# Patient Record
Sex: Female | Born: 1965 | Race: Black or African American | Hispanic: No | Marital: Single | Smoking: Former smoker
Health system: Southern US, Community
[De-identification: ages and names within clinical notes are randomized; demographics above are authoritative.]

## PROBLEM LIST (undated history)

## (undated) DIAGNOSIS — E785 Hyperlipidemia, unspecified: Secondary | ICD-10-CM

## (undated) DIAGNOSIS — K5790 Diverticulosis of intestine, part unspecified, without perforation or abscess without bleeding: Secondary | ICD-10-CM

## (undated) DIAGNOSIS — K219 Gastro-esophageal reflux disease without esophagitis: Secondary | ICD-10-CM

## (undated) DIAGNOSIS — M1990 Unspecified osteoarthritis, unspecified site: Secondary | ICD-10-CM

## (undated) DIAGNOSIS — I428 Other cardiomyopathies: Secondary | ICD-10-CM

## (undated) DIAGNOSIS — J302 Other seasonal allergic rhinitis: Secondary | ICD-10-CM

## (undated) DIAGNOSIS — G4730 Sleep apnea, unspecified: Secondary | ICD-10-CM

## (undated) DIAGNOSIS — I509 Heart failure, unspecified: Secondary | ICD-10-CM

## (undated) DIAGNOSIS — Z800 Family history of malignant neoplasm of digestive organs: Secondary | ICD-10-CM

## (undated) DIAGNOSIS — G4733 Obstructive sleep apnea (adult) (pediatric): Secondary | ICD-10-CM

## (undated) DIAGNOSIS — J45909 Unspecified asthma, uncomplicated: Secondary | ICD-10-CM

## (undated) DIAGNOSIS — E669 Obesity, unspecified: Secondary | ICD-10-CM

## (undated) DIAGNOSIS — T7840XA Allergy, unspecified, initial encounter: Secondary | ICD-10-CM

## (undated) DIAGNOSIS — I10 Essential (primary) hypertension: Secondary | ICD-10-CM

## (undated) HISTORY — DX: Sleep apnea, unspecified: G47.30

## (undated) HISTORY — DX: Gastro-esophageal reflux disease without esophagitis: K21.9

## (undated) HISTORY — DX: Unspecified asthma, uncomplicated: J45.909

## (undated) HISTORY — DX: Obstructive sleep apnea (adult) (pediatric): G47.33

## (undated) HISTORY — DX: Obesity, unspecified: E66.9

## (undated) HISTORY — DX: Family history of malignant neoplasm of digestive organs: Z80.0

## (undated) HISTORY — DX: Other cardiomyopathies: I42.8

## (undated) HISTORY — DX: Unspecified osteoarthritis, unspecified site: M19.90

## (undated) HISTORY — DX: Essential (primary) hypertension: I10

## (undated) HISTORY — PX: COLONOSCOPY: SHX174

## (undated) HISTORY — DX: Diverticulosis of intestine, part unspecified, without perforation or abscess without bleeding: K57.90

## (undated) HISTORY — DX: Other seasonal allergic rhinitis: J30.2

## (undated) HISTORY — DX: Hyperlipidemia, unspecified: E78.5

## (undated) HISTORY — DX: Allergy, unspecified, initial encounter: T78.40XA

## (undated) HISTORY — PX: TUBAL LIGATION: SHX77

---

## 1998-01-15 ENCOUNTER — Emergency Department (HOSPITAL_COMMUNITY): Admission: EM | Admit: 1998-01-15 | Discharge: 1998-01-15 | Payer: Self-pay | Admitting: Emergency Medicine

## 1998-01-16 ENCOUNTER — Emergency Department (HOSPITAL_COMMUNITY): Admission: EM | Admit: 1998-01-16 | Discharge: 1998-01-16 | Payer: Self-pay | Admitting: Emergency Medicine

## 1998-01-26 ENCOUNTER — Emergency Department (HOSPITAL_COMMUNITY): Admission: EM | Admit: 1998-01-26 | Discharge: 1998-01-26 | Payer: Self-pay

## 1998-04-24 ENCOUNTER — Emergency Department (HOSPITAL_COMMUNITY): Admission: EM | Admit: 1998-04-24 | Discharge: 1998-04-24 | Payer: Self-pay | Admitting: Emergency Medicine

## 2005-07-09 ENCOUNTER — Ambulatory Visit: Payer: Self-pay | Admitting: Gastroenterology

## 2005-07-15 ENCOUNTER — Ambulatory Visit: Payer: Self-pay | Admitting: Gastroenterology

## 2008-01-02 ENCOUNTER — Emergency Department (HOSPITAL_BASED_OUTPATIENT_CLINIC_OR_DEPARTMENT_OTHER): Admission: EM | Admit: 2008-01-02 | Discharge: 2008-01-02 | Payer: Self-pay | Admitting: Emergency Medicine

## 2008-02-19 ENCOUNTER — Emergency Department (HOSPITAL_COMMUNITY): Admission: EM | Admit: 2008-02-19 | Discharge: 2008-02-19 | Payer: Self-pay | Admitting: Emergency Medicine

## 2008-03-01 LAB — CONVERTED CEMR LAB

## 2008-03-15 ENCOUNTER — Encounter: Admission: RE | Admit: 2008-03-15 | Discharge: 2008-03-15 | Payer: Self-pay | Admitting: Obstetrics & Gynecology

## 2008-04-01 ENCOUNTER — Emergency Department (HOSPITAL_COMMUNITY): Admission: EM | Admit: 2008-04-01 | Discharge: 2008-04-01 | Payer: Self-pay | Admitting: Family Medicine

## 2009-02-03 ENCOUNTER — Emergency Department (HOSPITAL_COMMUNITY): Admission: EM | Admit: 2009-02-03 | Discharge: 2009-02-03 | Payer: Self-pay | Admitting: Emergency Medicine

## 2009-02-15 ENCOUNTER — Ambulatory Visit: Payer: Self-pay | Admitting: Internal Medicine

## 2009-02-15 ENCOUNTER — Inpatient Hospital Stay (HOSPITAL_COMMUNITY): Admission: EM | Admit: 2009-02-15 | Discharge: 2009-02-20 | Payer: Self-pay | Admitting: Emergency Medicine

## 2009-02-15 DIAGNOSIS — I080 Rheumatic disorders of both mitral and aortic valves: Secondary | ICD-10-CM | POA: Insufficient documentation

## 2009-02-15 DIAGNOSIS — D1803 Hemangioma of intra-abdominal structures: Secondary | ICD-10-CM

## 2009-02-16 ENCOUNTER — Encounter (INDEPENDENT_AMBULATORY_CARE_PROVIDER_SITE_OTHER): Payer: Self-pay | Admitting: Internal Medicine

## 2009-02-22 DIAGNOSIS — I10 Essential (primary) hypertension: Secondary | ICD-10-CM

## 2009-02-22 DIAGNOSIS — K219 Gastro-esophageal reflux disease without esophagitis: Secondary | ICD-10-CM

## 2009-02-22 DIAGNOSIS — J301 Allergic rhinitis due to pollen: Secondary | ICD-10-CM | POA: Insufficient documentation

## 2009-02-22 DIAGNOSIS — I509 Heart failure, unspecified: Secondary | ICD-10-CM | POA: Insufficient documentation

## 2009-02-22 HISTORY — DX: Gastro-esophageal reflux disease without esophagitis: K21.9

## 2009-02-27 ENCOUNTER — Ambulatory Visit: Payer: Self-pay | Admitting: Internal Medicine

## 2009-03-15 ENCOUNTER — Telehealth: Payer: Self-pay | Admitting: Internal Medicine

## 2009-03-20 ENCOUNTER — Encounter: Payer: Self-pay | Admitting: Internal Medicine

## 2009-03-22 ENCOUNTER — Encounter (INDEPENDENT_AMBULATORY_CARE_PROVIDER_SITE_OTHER): Payer: Self-pay | Admitting: Nurse Practitioner

## 2009-03-22 ENCOUNTER — Ambulatory Visit: Payer: Self-pay | Admitting: Internal Medicine

## 2009-03-22 DIAGNOSIS — H1045 Other chronic allergic conjunctivitis: Secondary | ICD-10-CM

## 2009-03-22 DIAGNOSIS — J45909 Unspecified asthma, uncomplicated: Secondary | ICD-10-CM | POA: Insufficient documentation

## 2009-05-19 ENCOUNTER — Ambulatory Visit: Payer: Self-pay | Admitting: Internal Medicine

## 2009-06-06 ENCOUNTER — Ambulatory Visit (HOSPITAL_COMMUNITY): Admission: RE | Admit: 2009-06-06 | Discharge: 2009-06-06 | Payer: Self-pay | Admitting: Internal Medicine

## 2009-06-06 ENCOUNTER — Encounter: Payer: Self-pay | Admitting: Internal Medicine

## 2009-06-06 ENCOUNTER — Ambulatory Visit: Payer: Self-pay

## 2009-06-06 ENCOUNTER — Encounter: Admission: RE | Admit: 2009-06-06 | Discharge: 2009-06-06 | Payer: Self-pay | Admitting: Obstetrics & Gynecology

## 2009-06-06 ENCOUNTER — Ambulatory Visit: Payer: Self-pay | Admitting: Cardiovascular Disease

## 2009-07-03 LAB — CONVERTED CEMR LAB
CO2: 29 meq/L (ref 19–32)
Chloride: 102 meq/L (ref 96–112)
Creatinine, Ser: 0.7 mg/dL (ref 0.4–1.2)
Glucose, Bld: 104 mg/dL — ABNORMAL HIGH (ref 70–99)
Pro B Natriuretic peptide (BNP): 39 pg/mL (ref 0.0–100.0)
Sodium: 138 meq/L (ref 135–145)

## 2009-08-09 ENCOUNTER — Ambulatory Visit: Payer: Self-pay | Admitting: Internal Medicine

## 2009-08-09 DIAGNOSIS — I5022 Chronic systolic (congestive) heart failure: Secondary | ICD-10-CM | POA: Insufficient documentation

## 2009-09-07 ENCOUNTER — Ambulatory Visit: Payer: Self-pay | Admitting: Internal Medicine

## 2009-09-11 LAB — CONVERTED CEMR LAB
BUN: 10 mg/dL (ref 6–23)
CO2: 31 meq/L (ref 19–32)
Chloride: 104 meq/L (ref 96–112)
Creatinine, Ser: 0.6 mg/dL (ref 0.4–1.2)
Potassium: 3.8 meq/L (ref 3.5–5.1)

## 2009-10-11 ENCOUNTER — Encounter (INDEPENDENT_AMBULATORY_CARE_PROVIDER_SITE_OTHER): Payer: Self-pay | Admitting: *Deleted

## 2009-12-06 ENCOUNTER — Ambulatory Visit: Payer: Self-pay | Admitting: Internal Medicine

## 2009-12-14 ENCOUNTER — Ambulatory Visit: Payer: Self-pay | Admitting: Internal Medicine

## 2009-12-14 LAB — CONVERTED CEMR LAB
BUN: 13 mg/dL (ref 6–23)
Calcium: 9.2 mg/dL (ref 8.4–10.5)
Creatinine, Ser: 0.8 mg/dL (ref 0.4–1.2)
Glucose, Bld: 88 mg/dL (ref 70–99)

## 2010-02-28 ENCOUNTER — Telehealth: Payer: Self-pay | Admitting: Internal Medicine

## 2010-04-09 ENCOUNTER — Ambulatory Visit
Admission: RE | Admit: 2010-04-09 | Discharge: 2010-04-09 | Payer: Self-pay | Source: Home / Self Care | Attending: Internal Medicine | Admitting: Internal Medicine

## 2010-04-09 ENCOUNTER — Telehealth (INDEPENDENT_AMBULATORY_CARE_PROVIDER_SITE_OTHER): Payer: Self-pay | Admitting: *Deleted

## 2010-04-10 ENCOUNTER — Encounter: Payer: Self-pay | Admitting: Internal Medicine

## 2010-04-13 LAB — CONVERTED CEMR LAB
CO2: 25 meq/L (ref 19–32)
Creatinine, Ser: 0.75 mg/dL (ref 0.40–1.20)
Digitoxin Lvl: 0.4 ng/mL — ABNORMAL LOW (ref 0.8–2.0)
Glucose, Bld: 78 mg/dL (ref 70–99)

## 2010-04-29 LAB — CONVERTED CEMR LAB
Potassium: 4.2 meq/L (ref 3.5–5.1)
Sodium: 139 meq/L (ref 135–145)

## 2010-05-03 NOTE — Assessment & Plan Note (Signed)
Summary: per check out/sf   Visit Type:  Follow-up Primary Provider:  need primary care dr pt has medicade  CC:  no complaints.  History of Present Illness: patient is a 45 year old with a history of HTN, NICM.  I last saw her in November. Since then she has been doing well.  Denies significant shortness of breath.  No signif edema. No cP.  No palpitations.  Eager to start exercising.  Current Medications (verified): 1)  Potassium Chloride Cr 10 Meq Cr-Caps (Potassium Chloride) .... 4 Tabs Two Times A Day 2)  Enalapril Maleate 2.5 Mg Tabs (Enalapril Maleate) .... Take One Tablet By Mouth Once Daily 3)  Digoxin 0.125 Mg Tabs (Digoxin) .Marland Kitchen.. 1 Tab Once Daily 4)  Furosemide 40 Mg Tabs (Furosemide) .... Take One Tablet  Two Times A Day 5)  Carvedilol 3.125 Mg Tabs (Carvedilol) .... Take One Tablet By Mouth Twice A Day  Allergies (verified): 1)  ! Iodine 2)  ! * Peanut Products 3)  ! * Shellfish  Past History:  Past Medical History: Last updated: 02/27/2009  1. Hypertension.   2. Gastroesophageal reflux disease.   3. Seasonal allergies.    4. Nonischemic cardiomyopathy.  Social History: Last updated: 03/22/2009  No alcohol.  No drugs.  No cigarettes.  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 EOMI, PERRLA.  Neck: JVP is normal. No thyromegaly. No bruits.  Lungs: clear to auscultation. No rales no wheezes.  Heart: Regular rate and rhythm. Normal S1, S2. No S3.   No significant murmurs. PMI not displaced.  Abdomen:  Supple, nontender. Normal bowel sounds. No masses. No hepatomegaly.  Extremities:   Good distal pulses throughout. No lower extremity edema.  Musculoskeletal :moving all extremities.  Neuro:   alert and oriented x3.    Impression & Recommendations:  Problem # 1:  CHF (ICD-428.0) Continue meds.  Volume status looks good.  Check labs.  May add aldactone.  Will schedule echo.  May need EP referral dep on LVEF. OK to walk, do water aerobics.  Problem # 2:  HYPERTENSION (ICD-401.9) GOod.  Check labs. Her updated medication list for this problem includes:    Enalapril Maleate 2.5 Mg Tabs (Enalapril maleate) .Marland Kitchen... Take one tablet by mouth once daily    Furosemide 40 Mg Tabs (Furosemide) .Marland Kitchen... Take one tablet  two times a day    Carvedilol 3.125 Mg Tabs (Carvedilol) .Marland Kitchen... Take one tablet by mouth twice a day  Other Orders: TLB-BMP (Basic Metabolic Panel-BMET) (80048-METABOL) TLB-BNP (B-Natriuretic Peptide) (83880-BNPR) Primary Care Referral (Primary) Echocardiogram (Echo)  Patient Instructions: 1)  Your physician recommends that you return for lab work in: lab work today...we will call you with results 2)  Your physician recommends that you schedule a follow-up appointment in: 4 months 3)  Your physician has requested that you have an echocardiogram.  Echocardiography is a painless test that uses sound waves to create images of your heart. It provides your doctor with information about the size  and shape of your heart and how well your heart's chambers and valves are working.  This procedure takes approximately one hour. There are no restrictions for this procedure.  we will call you back after we get the results of the test.

## 2010-05-03 NOTE — Letter (Signed)
Summary: Letter  Letter   Imported By: Kassie Mends 05/19/2009 12:47:35  _____________________________________________________________________  External Attachment:    Type:   Image     Comment:   External Document

## 2010-05-03 NOTE — Progress Notes (Signed)
  Phone Note Other Incoming   Request: Send information Summary of Call: Request for records received from [facility]. Request forwarded to [facility].     

## 2010-05-03 NOTE — Letter (Signed)
Summary: Appointment - Missed  [facility], Main Office  [address]   [place], [place] [address]   Phone: [phone]  Fax: [phone]     October 11, 2009 MRN: [id number]   Katrina Carrillo [address] [place], [place]  [address]   Dear Ms. Cherie Dark,  Our records indicate you missed your appointment on 10/05/09  with Dr.Ross.   It is very important that we reach you to reschedule this appointment. We look forward to participating in your health care needs. Please contact us at the number listed above at your earliest convenience to reschedule this appointment.     Sincerely,  Artist

## 2010-05-03 NOTE — Assessment & Plan Note (Signed)
Summary: per check out/sf   Primary Provider:  need primary care dr pt has medicade  CC:  rov.  Pt states she has questions about her pulse rate going up and down.  She feels like it may have regulated but still wants to know what numbers should concern her.  Pt also needs new Rx for digoxin.  History of Present Illness: Katrina Carrillo returns today for followup.  She is a pleasant 45 yo woman with a DCM and class 1-2 CHF.  She has LV dysfunction and we had considered prophylactic ICD but she has not had her medications maximized yet.  She denies syncope and is exercising regularly.  No peripheral edema or chest pain.  Current Medications (verified): 1)  Potassium Chloride Crys Cr 20 Meq Cr-Tabs (Potassium Chloride Crys Cr) .... Take 2 Tablets Two Times A Day 2)  Enalapril Maleate 2.5 Mg Tabs (Enalapril Maleate) .... Take One Tablet By Twice Daily 3)  Digoxin 0.125 Mg Tabs (Digoxin) .Marland Kitchen.. 1 Tab Once Daily 4)  Furosemide 40 Mg Tabs (Furosemide) .... Take One Tablet  Two Times A Day 5)  Vitamin D 400 Unit Tabs (Cholecalciferol) .... Qd 6)  Calcium 600 Mg Tabs (Calcium) .... Once Daily 7)  Multivitamins   Tabs (Multiple Vitamin) .... Qd 8)  Carvedilol 6.25 Mg Tabs (Carvedilol) .Marland Kitchen.. 1 Tab 2 Times Per Day  Allergies (verified): 1)  ! Iodine 2)  ! * Peanut Products 3)  ! * Shellfish  Past History:  Past Medical History: Last updated: 02/27/2009  1. Hypertension.   2. Gastroesophageal reflux disease.   3. Seasonal allergies.    4. Nonischemic cardiomyopathy.  Past Surgical History: Last updated: 03/22/2009  s/p tubal ligation   Review of Systems  The patient denies chest pain, syncope, dyspnea on exertion, and peripheral edema.    Vital Signs:  Patient profile:   45 year old female Menstrual status:  irregular Height:      63 inches Weight:      189 pounds BMI:     33.60 Pulse rate:   85 / minute Pulse rhythm:   regular BP sitting:   130 / 84  (right arm) Cuff size:    regular  Vitals Entered By: Judithe Modest CMA (December 06, 2009 9:31 AM)  Physical Exam  General:  Well developed, well nourished, in no acute distress.  HEENT: normal Neck: supple. No JVD. Carotids 2+ bilaterally no bruits Cor: RRR with a soft S4 gallop.  No murmurs or rubs.  PMI is enlarged and laterally displaced. Lungs: CTA Ab: soft, nontender. nondistended. No HSM. Good bowel sounds Ext: warm. no cyanosis, clubbing or edema Neuro: alert and oriented. Grossly nonfocal. affect pleasant    Impression & Recommendations:  Problem # 1:  CHRONIC SYSTOLIC HEART FAILURE (ICD-428.22)  Her symptoms are well controlled.  I have recommended we increase her enalapril to 5 mg twice daily.  She will come back in a week to recheck her potassium and creatinine. Her updated medication list for this problem includes:    Enalapril Maleate 5 Mg Tabs (Enalapril maleate) .Marland Kitchen... 1 two times a day    Digoxin 0.125 Mg Tabs (Digoxin) .Marland Kitchen... 1 tab once daily    Furosemide 40 Mg Tabs (Furosemide) .Marland Kitchen... Take one tablet  two times a day    Carvedilol 6.25 Mg Tabs (Carvedilol) .Marland Kitchen... 1 tab 2 times per day  Her updated medication list for this problem includes:    Enalapril Maleate 5 Mg Tabs (Enalapril maleate) .Marland KitchenMarland KitchenMarland KitchenMarland Kitchen  1 two times a day    Digoxin 0.125 Mg Tabs (Digoxin) .Marland Kitchen... 1 tab once daily    Furosemide 40 Mg Tabs (Furosemide) .Marland Kitchen... Take one tablet  two times a day    Carvedilol 6.25 Mg Tabs (Carvedilol) .Marland Kitchen... 1 tab 2 times per day  Problem # 2:  HYPERTENSION (ICD-401.9)  Her blood pressure is a bit on the high side.  Continue meds as below.  A low sodium diet is recommended. Her updated medication list for this problem includes:    Enalapril Maleate 5 Mg Tabs (Enalapril maleate) .Marland Kitchen... 1 two times a day    Furosemide 40 Mg Tabs (Furosemide) .Marland Kitchen... Take one tablet  two times a day    Carvedilol 6.25 Mg Tabs (Carvedilol) .Marland Kitchen... 1 tab 2 times per day  Her updated medication list for this problem  includes:    Enalapril Maleate 5 Mg Tabs (Enalapril maleate) .Marland Kitchen... 1 two times a day    Furosemide 40 Mg Tabs (Furosemide) .Marland Kitchen... Take one tablet  two times a day    Carvedilol 6.25 Mg Tabs (Carvedilol) .Marland Kitchen... 1 tab 2 times per day  Other Orders: EKG w/ Interpretation (93000) EKG w/ Interpretation (93000)  Patient Instructions: 1)  Your physician recommends that you schedule a follow-up appointment in: 4 MONTHS WITH DR ROSS 2)  Your physician recommends that you return for lab work in: 1 WEEK BMET 3)  Your physician has recommended you make the following change in your medication: INCREASE ENALAPRIL TO 5 MG two times a day  Prescriptions: ENALAPRIL MALEATE 5 MG TABS (ENALAPRIL MALEATE) 1 two times a day  #60 x 11   Entered by:   Scherrie Bateman, LPN   Authorized by:   Laren Boom, MD, [facility]   Signed by:   Scherrie Bateman, LPN on 16/12/9602   Method used:   Electronically to        [facility] (retail)       [address]       [place], [place]  [address]       Ph: [phone]       Fax: [phone]   RxID:   [id number] DIGOXIN 0.125 MG TABS (DIGOXIN) 1 tab once daily  #30 x 11   Entered by:   Scherrie Bateman, LPN   Authorized by:   Laren Boom, MD, [facility]   Signed by:   Scherrie Bateman, LPN on 01/26/2535   Method used:   Electronically to        [facility] (retail)       [address]       [place], [place]  [address]       Ph: [phone]       Fax: [phone]   RxID:   [id number]

## 2010-05-03 NOTE — Assessment & Plan Note (Signed)
Summary: per check out/sf   Visit Type:  Follow-up Primary Provider:  need primary care dr pt has medicade  CC:  fatigue.  History of Present Illness: Patient is a 45 year old with a nonischemic cardiomyopathy.  I last saw her in clinic earlier this spring.  Since then she has been seen by G. Ladona Ridgel.  He had recommended that she  have her medicines maximized as she tolerates.  Repeat echo after to reeval LVEF. Since seen she notes no severe SOB.  No chest pain.  No PND>  Current Medications (verified): 1)  Potassium Chloride Cr 10 Meq Cr-Caps (Potassium Chloride) .... Pt Out 2)  Enalapril Maleate 2.5 Mg Tabs (Enalapril Maleate) .... Take One Tablet By Twice Daily 3)  Digoxin 0.125 Mg Tabs (Digoxin) .Marland Kitchen.. 1 Tab Once Daily 4)  Furosemide 40 Mg Tabs (Furosemide) .... Take One Tablet  Two Times A Day 5)  Carvedilol 3.125 Mg Tabs (Carvedilol) .... Take One Tablet By Mouth Twice A Day 6)  Vitamin D 400 Unit Tabs (Cholecalciferol) .... Qd 7)  Calcium 600 Mg Tabs (Calcium) .... Once Daily 8)  Multivitamins   Tabs (Multiple Vitamin) .... Qd  Allergies (verified): 1)  ! Iodine 2)  ! * Peanut Products 3)  ! * Shellfish  Past History:  Past medical, surgical, family and social histories (including risk factors) reviewed, and no changes noted (except as noted below).  Past Medical History: Reviewed history from 02/27/2009 and no changes required.  1. Hypertension.   2. Gastroesophageal reflux disease.   3. Seasonal allergies.    4. Nonischemic cardiomyopathy.  Past Surgical History: Reviewed history from 03/22/2009 and no changes required.  s/p tubal ligation   Family History: Reviewed history from 02/22/2009 and no changes required.  Positive for hypertension, diabetes, and coronary   artery disease.   Social History: Reviewed history from 03/22/2009 and no changes required.  No alcohol.  No drugs.  No cigarettes.  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 EOMI, PERRLA.  Neck: JVP is normal. No thyromegaly. No bruits.  Lungs: clear to auscultation. No rales no wheezes.  Heart: Regular rate and rhythm. Normal S1, S2. No S3.  Gr II/VI systolic murmur. PMI not displaced.  Abdomen:  Supple, nontender. Normal bowel sounds. No masses. No hepatomegaly.  Extremities:   Good distal pulses throughout. No lower extremity edema.  Musculoskeletal :moving all extremities.  Neuro:   alert and oriented x3.    Impression & Recommendations:  Problem # 1:  CHRONIC SYSTOLIC HEART FAILURE (ICD-428.22) Patient's volume status looks good.  I would incrase her Coreg to 6.25 two times a day.  F/U in clinic in about 3 to 4 wks.  Plan echo probably inn late fall.  Problem # 2:  MITRAL REGURGITATION, MODERATE (ICD-396.3) Follow.  Problem # 3:  HYPERTENSION (ICD-401.9) Follow witn increase in meds.  Other Orders: TLB-BMP (Basic Metabolic Panel-BMET) (80048-METABOL)  Patient Instructions: 1)  Your physician recommends  that you schedule a follow-up appointment in: 1 month 2)  Your physician recommends that you return for lab work in: lab work today ...  we will call you with results Prescriptions: CARVEDILOL 6.25 MG TABS (CARVEDILOL) 1 tab 2 times per day  #60 x 11   Entered by:   Layne Benton, RN, BSN   Authorized by:   Sherrill Raring, MD, [facility]   Signed by:   Layne Benton, RN, BSN on 09/07/2009   Method used:   Electronically to        [facility] (retail)       [address]       [place], [place]  [address]       Ph:  [phone]       Fax: [phone]   RxID:   [id number] POTASSIUM CHLORIDE CR 10 MEQ CR-CAPS (POTASSIUM CHLORIDE) 1 cap every day  #30 x 11   Entered by:   Layne Benton, RN, BSN   Authorized by:   Sherrill Raring, MD, [facility]   Signed by:   Layne Benton, RN, BSN on 09/07/2009   Method used:   Electronically to        [facility] (retail)       [address]       [place], [place]  [address]       Ph: [phone]       Fax: [phone]   RxID:   [id number]

## 2010-05-03 NOTE — Miscellaneous (Signed)
Summary: Orders Update  Clinical Lists Changes  Orders: Added new Test order of TLB-BMP (Basic Metabolic Panel-BMET) (80048-METABOL) - Signed 

## 2010-05-03 NOTE — Assessment & Plan Note (Signed)
Summary: NEP/ EF 30 % / PT HAS HEALTH SERVE CARD./ GD   Visit Type:  Follow-up Primary Provider:  need primary care dr pt has medicade   History of Present Illness: The patient is referred today by Dr. Tenny Craw for consideration for ICD implant.  She is a pleasant 45 yo woman with a h/o systolic CHF diagnosed in November 2010.  At that time, she underwent left heart cath which demonstrated global dysfunction and no obstructive CAD.  She has hypertension and some medical non-compliance in the past.  The patient has been on lasix, digoxin, and low dose beta blocker and ACE inhibitor.  Her repeat 2D echo revealed that her EF has increased to 30%.  Her CHF is between class 1 and 2.  Current Medications (verified): 1)  Potassium Chloride Cr 10 Meq Cr-Caps (Potassium Chloride) .... 4 Tabs Two Times A Day 2)  Enalapril Maleate 2.5 Mg Tabs (Enalapril Maleate) .... Take One Tablet By Mouth Once Daily 3)  Digoxin 0.125 Mg Tabs (Digoxin) .Marland Kitchen.. 1 Tab Once Daily 4)  Furosemide 40 Mg Tabs (Furosemide) .... Take One Tablet  Two Times A Day 5)  Carvedilol 3.125 Mg Tabs (Carvedilol) .... Take One Tablet By Mouth Twice A Day 6)  Vitamin D 400 Unit Tabs (Cholecalciferol) .... Qd 7)  Calcium 600 Mg Tabs (Calcium) .... Once Daily 8)  Multivitamins   Tabs (Multiple Vitamin) .... Qd  Allergies (verified): 1)  ! Iodine 2)  ! * Peanut Products 3)  ! * Shellfish  Past History:  Past Medical History: Last updated: 02/27/2009  1. Hypertension.   2. Gastroesophageal reflux disease.   3. Seasonal allergies.    4. Nonischemic cardiomyopathy.  Review of Systems       All systems reviewed and negative except as noted in the HPI.  Vital Signs:  Patient profile:   45 year old female Menstrual status:  irregular Height:      63 inches Weight:      176 pounds BMI:     31.29 Pulse rate:   86 / minute BP sitting:   130 / 90  (left arm)  Vitals Entered By: Laurance Flatten CMA (Aug 09, 2009 11:39 AM)  Physical  Exam  General:  Well developed, well nourished, in no acute distress.  HEENT: normal Neck: supple. No JVD. Carotids 2+ bilaterally no bruits Cor: RRR with a soft S4 gallop.  No murmurs or rubs.  PMI is enlarged and laterally displaced. Lungs: CTA Ab: soft, nontender. nondistended. No HSM. Good bowel sounds Ext: warm. no cyanosis, clubbing or edema Neuro: alert and oriented. Grossly nonfocal. affect pleasant    EKG  Procedure date:  08/09/2009  Findings:      Normal sinus rhythm with rate of: 86.   Impression & Recommendations:  Problem # 1:  CHRONIC SYSTOLIC HEART FAILURE (ICD-428.22) Her EF by echo is improve on very lose dose medical therapy.  She has class 1-2 CHF.  I have discussed the treatment options with the patient and her daughter.  I have recommended we try to continue to uptitrate her CHF meds.  Once we get her on maximal medical therapy, I would recheck her again, perhaps with MRI and would recommend ICD if her EF remains less than 35% and CHF is class 2. Her updated medication list for this problem includes:    Enalapril Maleate 2.5 Mg Tabs (Enalapril maleate) .Marland Kitchen... Take one tablet by twice daily    Digoxin 0.125 Mg Tabs (Digoxin) .Marland KitchenMarland KitchenMarland KitchenMarland Kitchen 1  tab once daily    Furosemide 40 Mg Tabs (Furosemide) .Marland Kitchen... Take one tablet  two times a day    Carvedilol 3.125 Mg Tabs (Carvedilol) .Marland Kitchen... Take one tablet by mouth twice a day  Problem # 2:  HYPERTENSION (ICD-401.9) Her blood pressure is still elevated.  Will uptitrate her medical therapy and maintain a low sodium diet. Her updated medication list for this problem includes:    Enalapril Maleate 2.5 Mg Tabs (Enalapril maleate) .Marland Kitchen... Take one tablet by twice daily    Furosemide 40 Mg Tabs (Furosemide) .Marland Kitchen... Take one tablet  two times a day    Carvedilol 3.125 Mg Tabs (Carvedilol) .Marland Kitchen... Take one tablet by mouth twice a day  Patient Instructions: 1)  Your physician recommends that you schedule a follow-up appointment in: one month  with Dr Tenny Craw, 4 months with Dr Ladona Ridgel 2)  Your physician has recommended you make the following change in your medication: increase your Enalapril to 2.5mg  two times a day Prescriptions: ENALAPRIL MALEATE 2.5 MG TABS (ENALAPRIL MALEATE) Take one tablet by twice daily  #60 x 6   Entered by:   Dennis Bast, RN, BSN   Authorized by:   Laren Boom, MD, [facility]   Signed by:   Dennis Bast, RN, BSN on 08/09/2009   Method used:   Electronically to        [facility] (retail)       [address]       [place], [place]  [address]       Ph: [phone]       Fax: [phone]   RxID:   [id number]

## 2010-05-03 NOTE — Assessment & Plan Note (Signed)
Summary: 4 MO F/U ./CY   Visit Type:  Follow-up Primary Provider:  need primary care dr pt has medicade   History of Present Illness: Katrina Carrillo is a 45 year old with a history of NICM.  She was last in cardiology in the Fall of 2011.  She was seen by Rosette Reveal at that time.  He recommended maximizing medical RX and reevaluating  LVEF with echo.  He incrased enalopril. Since seen she has done rel well from a cardiac standpoint.  Still class I-II heart failure.  Under incrased stress with son.  Current Medications (verified): 1)  Potassium Chloride Crys Cr 20 Meq Cr-Tabs (Potassium Chloride Crys Cr) .... Take 2 Tablets Two Times A Day 2)  Enalapril Maleate 5 Mg Tabs (Enalapril Maleate) .Marland Kitchen.. 1 Two Times A Day 3)  Digoxin 0.125 Mg Tabs (Digoxin) .Marland Kitchen.. 1 Tab Once Daily 4)  Furosemide 40 Mg Tabs (Furosemide) .... Take One Tablet  Two Times A Day 5)  Calcium Carbonate-Vitamin D 600-400 Mg-Unit  Tabs (Calcium Carbonate-Vitamin D) .... Once Daily 6)  Carvedilol 6.25 Mg Tabs (Carvedilol) .Marland Kitchen.. 1 Tab 2 Times Per Day 7)  Claritin 10 Mg Tabs (Loratadine) .... As Needed  Allergies (verified): 1)  ! Iodine 2)  ! * Peanut Products 3)  ! * Shellfish  Past History:  Past medical, surgical, family and social histories (including risk factors) reviewed, and no changes noted (except as noted below).  Past Medical History: Reviewed history from 02/27/2009 and no changes required.  1. Hypertension.   2. Gastroesophageal reflux disease.   3. Seasonal allergies.    4. Nonischemic cardiomyopathy.  Past Surgical History: Reviewed history from 03/22/2009 and no changes required.  s/p tubal ligation   Family History: Reviewed history from 02/22/2009 and no changes required.  Positive for hypertension, diabetes, and coronary   artery disease.   Social History: Reviewed history from 03/22/2009 and no changes required.  No alcohol.  No drugs.  No cigarettes.  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  Neg to the above problem except as noted above.  Vital Signs:  Katrina Carrillo profile:   45 year old female Menstrual status:  irregular Height:      63 inches Weight:      188 pounds BMI:     33.42 Pulse rate:   85 / minute BP sitting:   122 / 74  (left arm) Cuff size:   regular  Vitals Entered By: Hardin Negus, RMA (April 09, 2010 4:21 PM)  Physical Exam  Additional Exam:  Katrina Carrillo is in NAD HEENT:  Normocephalic, atraumatic. EOMI, PERRLA.  Neck: JVP is normal. No thyromegaly. No bruits.  Lungs: clear to auscultation. No rales no wheezes.  Heart: Regular rate and rhythm. Normal S1, S2. No S3.   No significant murmurs. PMI not displaced.  Abdomen:  Supple, nontender. Normal bowel sounds. No masses. No hepatomegaly.  Extremities:   Good distal pulses throughout. No lower extremity edema.  Musculoskeletal :moving all extremities.  Neuro:   alert and oriented x3.    Impression & Recommendations:  Problem # 1:  CHRONIC SYSTOLIC HEART FAILURE (ICD-428.22) Volume looks god.  I would recommend increasing Coreg to 12. 5 mg two times a day.   CHeck BMET and DIg level today.  CHeck echo on return    F/U in 4 to 6 wks. The following medications were removed from  the medication list:    Carvedilol 6.25 Mg Tabs (Carvedilol) .Marland Kitchen... 1 tab 2 times per day Her updated medication list for this problem includes:    Enalapril Maleate 5 Mg Tabs (Enalapril maleate) .Marland Kitchen... 1 two times a day    Digoxin 0.125 Mg Tabs (Digoxin) .Marland Kitchen... 1 tab once daily    Furosemide 40 Mg Tabs (Furosemide) .Marland Kitchen... Take one tablet  two times a day    Carvedilol 12.5 Mg Tabs (Carvedilol) .Marland Kitchen... 1 tab every 12 hours  Orders: T-Basic Metabolic Panel [id number]) T-Digoxin [id number])  Problem # 2:  MITRAL REGURGITATION, MODERATE (ICD-396.3) Follow.  Problem # 3:  HYPERTENSION (ICD-401.9) Follow up in 4 wks. The following medications were removed  from the medication list:    Carvedilol 6.25 Mg Tabs (Carvedilol) .Marland Kitchen... 1 tab 2 times per day Her updated medication list for this problem includes:    Enalapril Maleate 5 Mg Tabs (Enalapril maleate) .Marland Kitchen... 1 two times a day    Furosemide 40 Mg Tabs (Furosemide) .Marland Kitchen... Take one tablet  two times a day    Carvedilol 12.5 Mg Tabs (Carvedilol) .Marland Kitchen... 1 tab every 12 hours  Katrina Carrillo Instructions: 1)  Your physician recommends that you return for lab work in: lab work today....we will call you with results 2)  Your physician recommends that you schedule a follow-up appointment in: 4 to 6 weeks with Dr.Ross Prescriptions: CARVEDILOL 12.5 MG TABS (CARVEDILOL) 1 tab every 12 hours  #60 x 6   Entered by:   Layne Benton, RN, BSN   Authorized by:   Sherrill Raring, MD, [facility]   Signed by:   Layne Benton, RN, BSN on 04/09/2010   Method used:   Electronically to        [facility] (retail)       [address]       [place], [place]  [address]       Ph: [phone]       Fax: [phone]   RxID:   [id number]

## 2010-05-03 NOTE — Progress Notes (Signed)
Summary: rx refill  Phone Note Refill Request Message from:  Patient on February 28, 2010 11:12 AM  Refills Requested: Medication #1:  POTASSIUM CHLORIDE CRYS CR 20 MEQ CR-TABS take 2 tablets two times a day  Medication #2:  FUROSEMIDE 40 MG TABS Take one tablet  two times a day furosemide please call it in at [facility]# [phone]. potassium please call in [facility]# [phone]   Method Requested: Telephone to Pharmacy Initial call taken by: Roe Coombs,  February 28, 2010 11:14 AM  Follow-up for Phone Call       Follow-up by: Judithe Modest CMA,  February 28, 2010 12:52 PM    Prescriptions: FUROSEMIDE 40 MG TABS (FUROSEMIDE) Take one tablet  two times a day  #60 Each x 2   Entered by:   Judithe Modest CMA   Authorized by:   Sherrill Raring, MD, [facility]   Signed by:   Judithe Modest CMA on 02/28/2010   Method used:   Electronically to        [facility] (retail)       [address]       [place], [place]  [address]       Ph: [phone]       Fax: [phone]   RxID:   [id number] POTASSIUM CHLORIDE CRYS CR 20 MEQ CR-TABS (POTASSIUM CHLORIDE CRYS CR) take 2 tablets two times a day  #120 x 1   Entered by:   Judithe Modest CMA   Authorized by:   Sherrill Raring, MD, [facility]   Signed by:   Judithe Modest CMA on 02/28/2010   Method used:   Electronically to        [facility] (retail)       [address]       [place], [place]  [address]       Ph: [phone]       Fax: [phone]   RxID:   [id number]  Correction made.  Potassium called in at [facility] on [address].  Spoke to [place] in the pharmacy.  Called and cancelled refill at [facility] for the potassium.  Per pt her furosemide is cheaper at [facility] and the potassium is cheaper at [facility].  Confirmed Rx with pt. Judithe Modest, CMA

## 2010-05-03 NOTE — Letter (Signed)
Summary: PT INFORMATION SHEET  PT INFORMATION SHEET   Imported By: Arta Bruce 04/28/2009 12:11:05  _____________________________________________________________________  External Attachment:    Type:   Image     Comment:   External Document

## 2010-05-17 ENCOUNTER — Telehealth: Payer: Self-pay | Admitting: Internal Medicine

## 2010-05-18 ENCOUNTER — Ambulatory Visit (INDEPENDENT_AMBULATORY_CARE_PROVIDER_SITE_OTHER): Payer: Self-pay | Admitting: Internal Medicine

## 2010-05-18 ENCOUNTER — Encounter: Payer: Self-pay | Admitting: Internal Medicine

## 2010-05-18 DIAGNOSIS — I509 Heart failure, unspecified: Secondary | ICD-10-CM

## 2010-05-21 ENCOUNTER — Telehealth (INDEPENDENT_AMBULATORY_CARE_PROVIDER_SITE_OTHER): Payer: Self-pay | Admitting: *Deleted

## 2010-05-23 NOTE — Progress Notes (Signed)
Summary: rx refill  Phone Note Refill Request Message from:  Patient on May 17, 2010 1:18 PM  Refills Requested: Medication #1:  POTASSIUM CHLORIDE CRYS CR 20 MEQ CR-TABS take 2 tablets two times a day  Method Requested: Telephone to Pharmacy Initial call taken by: Roe Coombs,  May 17, 2010 1:19 PM  Follow-up for Phone Call        pt has 4 kdur until she comes into appt tommorrow Follow-up by: Burnett Kanaris, CNA,  May 17, 2010 4:04 PM

## 2010-05-29 NOTE — Assessment & Plan Note (Addendum)
Summary: 5 wk f/u @ 9:30per jackie on 04/09/10@ 5:22/sl/nurse aware of b...   Visit Type:  Follow-up Primary Provider:  need primary care dr pt has medicade  CC:  pain in left side of neck and a little fatigued.  History of Present Illness: Patient is 45 year old iwth a history of NICM.  I saw her back in December.  At that time I incrased her Coreg.  Since then, she has done ok.  She notes occasional dizziness, not severe.  Breathing is about the same.  Still gives out with activities if she goes to fast.  Is lookng in to getting involved in an exercise program to lose wt.  Current Medications (verified): 1)  Potassium Chloride Crys Cr 20 Meq Cr-Tabs (Potassium Chloride Crys Cr) .... Take 2 Tablets Two Times A Day 2)  Enalapril Maleate 5 Mg Tabs (Enalapril Maleate) .Marland Kitchen.. 1 Two Times A Day 3)  Digoxin 0.25 Mg Tabs (Digoxin) .... Take One Tablet By Mouth Daily 4)  Furosemide 40 Mg Tabs (Furosemide) .... Take One Tablet  Two Times A Day 5)  Calcium Carbonate-Vitamin D 600-400 Mg-Unit  Tabs (Calcium Carbonate-Vitamin D) .... Once Daily 6)  Claritin 10 Mg Tabs (Loratadine) .... As Needed 7)  Carvedilol 12.5 Mg Tabs (Carvedilol) .Marland Kitchen.. 1 Tab Every 12 Hours  Allergies (verified): 1)  ! Iodine 2)  ! * Peanut Products 3)  ! * Shellfish  Past History:  Past medical, surgical, family and social histories (including risk factors) reviewed, and no changes noted (except as noted below).  Past Medical History: Reviewed history from 02/27/2009 and no changes required.  1. Hypertension.   2. Gastroesophageal reflux disease.   3. Seasonal allergies.    4. Nonischemic cardiomyopathy.  Past Surgical History: Reviewed history from 03/22/2009 and no changes required.  s/p tubal ligation   Family History: Reviewed history from 02/22/2009 and no changes required.  Positive for hypertension, diabetes, and coronary   artery disease.   Social History: Reviewed history from 03/22/2009 and no changes  required.  No alcohol.  No drugs.  No cigarettes.  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  Neg to the above problem except as noted above.  Vital Signs:  Patient profile:   45 year old female Menstrual status:  irregular Height:      63 inches Weight:      190.50 pounds BMI:     33.87 Pulse rate:   65 / minute BP sitting:   109 / 76  (left arm) Cuff size:   regular  Vitals Entered By: Caralee Ates CMA (May 18, 2010 9:41 AM)  Physical Exam  Additional Exam:  Patient is in NAD HEENT:  Normocephalic, atraumatic. EOMI, PERRLA.  Neck: JVP is normal. No thyromegaly. No bruits.  Lungs: clear to auscultation. No rales no wheezes.  Heart: Regular rate and rhythm. Normal S1, S2. No S3.   No significant murmurs. PMI not displaced.  Abdomen:  Supple, nontender. Normal bowel sounds. No masses. No hepatomegaly.  Extremities:   Good distal pulses throughout. No lower extremity edema.  Musculoskeletal :moving all extremities.  Neuro:   alert and oriented x3.    Impression & Recommendations:  Problem # 1:  CHRONIC SYSTOLIC HEART FAILURE (ICD-428.22) Volume status is good.  I would keep her on the same regimen for now.  With her occasional dizziness, I would not push furhter for  now. Encouraged her and reassured her about exercise program.  This should be a benefit. Notes that K is expensive.  Unfortunately, needs it with Lasix regimen.  Does not admit to excess fluid intake.  I do not think lasix dose can be lowered.  Problem # 2:  MITRAL REGURGITATION, MODERATE (ICD-396.3) Follow.  Problem # 3:  HYPERTENSION (ICD-401.9) Keep on same regiman for now. Her updated medication list for this problem includes:    Enalapril Maleate 5 Mg Tabs (Enalapril maleate) .Marland Kitchen... 1 two times a day    Furosemide 40 Mg Tabs (Furosemide) .Marland Kitchen... Take one tablet  two times a day    Carvedilol 12.5 Mg Tabs (Carvedilol) .Marland Kitchen... 1 tab  every 12 hours  Patient Instructions: 1)  Your physician recommends that you schedule a follow-up appointment in: June 2012 with Dr.Shiron Whetsel Prescriptions: POTASSIUM CHLORIDE CRYS CR 20 MEQ CR-TABS (POTASSIUM CHLORIDE CRYS CR) take 2 tablets two times a day  #120 x 6   Entered by:   Layne Benton, RN, BSN   Authorized by:   Sherrill Raring, MD, [facility]   Signed by:   Layne Benton, RN, BSN on 05/18/2010   Method used:   Electronically to        [facility] [phone]* (retail)       [address]       [place], [place]  [address]       Ph: [phone]       Fax: [phone]   RxID:   [id number]

## 2010-05-29 NOTE — Progress Notes (Signed)
  Phone Note Other Incoming   Request: Send information Summary of Call: Request for records received from [facility]. Request forwarded to [facility].  11/2009 to 05/2010

## 2010-06-11 ENCOUNTER — Encounter: Payer: Self-pay | Admitting: Nurse Practitioner

## 2010-06-11 ENCOUNTER — Encounter (INDEPENDENT_AMBULATORY_CARE_PROVIDER_SITE_OTHER): Payer: Self-pay | Admitting: Nurse Practitioner

## 2010-06-11 DIAGNOSIS — E049 Nontoxic goiter, unspecified: Secondary | ICD-10-CM | POA: Insufficient documentation

## 2010-06-13 LAB — CONVERTED CEMR LAB
BUN: 10 mg/dL (ref 6–23)
Basophils Absolute: 0 10*3/uL (ref 0.0–0.1)
CO2: 29 meq/L (ref 19–32)
Chloride: 101 meq/L (ref 96–112)
Eosinophils Absolute: 0.2 10*3/uL (ref 0.0–0.7)
Eosinophils Relative: 2 % (ref 0–5)
Hemoglobin: 12.9 g/dL (ref 12.0–15.0)
Lymphocytes Relative: 17 % (ref 12–46)
MCHC: 31.9 g/dL (ref 30.0–36.0)
Monocytes Relative: 6 % (ref 3–12)
Platelets: 344 10*3/uL (ref 150–400)
RBC: 4.92 M/uL (ref 3.87–5.11)
Sodium: 141 meq/L (ref 135–145)
WBC: 11.2 10*3/uL — ABNORMAL HIGH (ref 4.0–10.5)

## 2010-06-19 NOTE — Assessment & Plan Note (Signed)
Summary: Re-establish care   Vital Signs:  Patient profile:   45 year old female Menstrual status:  irregular LMP:     01/2009 Weight:      190.5 pounds BMI:     33.87 Temp:     98.4 degrees F oral Pulse rate:   77 / minute Pulse rhythm:   regular Resp:     20 per minute BP sitting:   128 / 84  (left arm) Cuff size:   regular  Vitals Entered By: Levon Hedger (June 11, 2010 12:19 PM)  Nutrition Counseling: Patient's BMI is greater than 25 and therefore counseled on weight management options. CC: check for thyroid problem, Hypertension Management, Abdominal Pain, CHF Management Is Patient Diabetic? No Pain Assessment Patient in pain? no       Does patient need assistance? Functional Status Self care Ambulation Normal LMP (date): 01/2009     Enter LMP: 01/2009 Last PAP Result historical per pt   Primary Care Provider:  need primary care dr pt has medicade  CC:  check for thyroid problem, Hypertension Management, Abdominal Pain, and CHF Management.  History of Present Illness:  Pt has not been in this office in over 1 year. She has been going to cardiology since that time. Her next appt is 08/2010   Last PAP done over 1 year ago.  She was previously seen by Dr. Gerri Spore at [facility]  Pt did not bring her meds today with her into the office however she can recall them.  Obesity - Pt admits that she needs to lose some weight.  She would like a more regimented plan.  She was previously very into working out.  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  The bleeding index is 'low risk'.  Positive CHADS2 values include History of CHF and History of HTN.  Negative CHADS2 values include Age > 72 years old, History of Diabetes, and Prior Stroke/CVA/TIA.    CHF History:      Daily weights are  being checked.  She understands fluid management and sodium restriction and is following this regimen.  The patient expresses understanding of the treatment plan and her medications.  She admits to being compliant with her medications.  ADL's are being done without symptoms or restrictions.  The patient is enrolled in (or has completed) the CHF Eduction Program.  She denies any side effects from her medications.    Dyspepsia History:      She has no alarm features of dyspepsia including no history of melena, hematochezia, dysphagia, persistent vomiting, or involuntary weight loss > 5%.  There is a prior history of GERD.  The patient does not have a prior history of documented ulcer disease.  The dominant symptom is not heartburn or acid reflux.  An H-2 blocker medication is not currently being taken.    Hypertension History:      She denies headache, chest pain, and palpitations.  She notes no problems with any antihypertensive medication side effects.  pt is taking meds as ordered.        Positive major cardiovascular risk factors include hypertension.  Negative major cardiovascular risk factors include female age less than 54 years old, no history of diabetes or hyperlipidemia, and non-tobacco-user status.        Positive history for  target organ damage include cardiac end organ damage (either CHF or LVH).  Further assessment for target organ damage reveals no history of ASHD, stroke/TIA, peripheral vascular disease, renal insufficiency, or hypertensive retinopathy.       Habits & Providers  Alcohol-Tobacco-Diet     Alcohol drinks/day: 0     Tobacco Status: never  Exercise-Depression-Behavior     Does Patient Exercise: no     Exercise Counseling: to improve exercise regimen     Depression Counseling: not indicated; screening negative for depression     Drug Use: never  Allergies (verified): 1)  ! Iodine 2)  ! * Peanut Products 3)  ! * Shellfish  Social History: Does Patient Exercise:   no  Review of Systems General:  Complains of fatigue; denies fever. ENT:  ? enlargement in thyroid. CV:  Denies chest pain or discomfort. Resp:  Denies cough. GI:  Denies abdominal pain, nausea, and vomiting.  Physical Exam  General:  alert.   Head:  normocephalic.   Eyes:  glasses Neck:  slightly enlarged - nontender Lungs:  normal breath sounds.   Heart:  normal rate and regular rhythm.   Abdomen:  normal bowel sounds.   Neurologic:  alert & oriented X3.   Skin:  color normal.   Psych:  Oriented X3.     Impression & Recommendations:  Problem # 1:  HYPERTENSION (ICD-401.9)  Her updated medication list for this problem includes:    Enalapril Maleate 5 Mg Tabs (Enalapril maleate) .Marland Kitchen... 1 two times a day    Furosemide 40 Mg Tabs (Furosemide) .Marland Kitchen... Take one tablet  two times a day    Carvedilol 12.5 Mg Tabs (Carvedilol) .Marland Kitchen... 1 tab every 12 hours  Problem # 2:  CHF (ICD-428.0)  Her updated medication list for this problem includes:    Enalapril Maleate 5 Mg Tabs (Enalapril maleate) .Marland Kitchen... 1 two times a day    Digoxin 0.25 Mg Tabs (Digoxin) .Marland Kitchen... Take one tablet by mouth daily    Furosemide 40 Mg Tabs (Furosemide) .Marland Kitchen... Take one tablet  two times a day    Carvedilol 12.5 Mg Tabs (Carvedilol) .Marland Kitchen... 1 tab every 12 hours  Problem # 3:  CHRONIC SYSTOLIC HEART FAILURE (ICD-428.22)  Her updated medication list for this problem includes:    Enalapril Maleate 5 Mg Tabs (Enalapril maleate) .Marland Kitchen... 1 two times a day    Digoxin 0.25 Mg Tabs (Digoxin) .Marland Kitchen... Take one tablet by mouth daily    Furosemide 40 Mg Tabs (Furosemide) .Marland Kitchen... Take one tablet  two times a day    Carvedilol 12.5 Mg Tabs (Carvedilol) .Marland Kitchen... 1 tab every 12 hours  Problem # 4:  THYROMEGALY (ICD-240.9) will check labs today Orders: T-TSH ([id number]) T-CBC w/Diff [id number]) T-Comprehensive Metabolic Panel [id number])  Complete Medication List: 1)  Potassium Chloride Crys Cr 20 Meq Cr-tabs (Potassium chloride  crys cr) .... Take 2 tablets two times a day 2)  Enalapril Maleate 5 Mg Tabs (Enalapril maleate) .Marland Kitchen.. 1 two times a day 3)  Digoxin 0.25 Mg Tabs (Digoxin) .... Take one tablet by mouth daily 4)  Furosemide 40 Mg Tabs (Furosemide) .... Take one tablet  two times a day 5)  Calcium Carbonate-vitamin D 600-400 Mg-unit Tabs (Calcium carbonate-vitamin d) .... Once daily 6)  Carvedilol 12.5 Mg Tabs (Carvedilol) .Marland Kitchen.. 1 tab every 12 hours  CHF Assessment/Plan:      The patient's current weight is 190.5 pounds.  Her previous weight was 190.50 pounds.    Hypertension Assessment/Plan:  The patient's hypertensive risk group is category C: Target organ damage and/or diabetes.  Today's blood pressure is 128/84.  Her blood pressure goal is < 140/90.   Patient Instructions: 1)  Schedule an appointment for a complete physical exam.  2)  You will need PAP, mammogram, u/a, EKG, fasting labs. 3)  Try to incorporate some exercise into your routine. 4)  Thyroid labs will be checked today and you will be notified of the results.   Orders Added: 1)  Est. Patient Level III [99213] 2)  T-TSH [[id number]] 3)  T-CBC w/Diff [[id number]] 4)  T-Comprehensive Metabolic Panel [[id number]]

## 2010-07-04 LAB — BASIC METABOLIC PANEL
CO2: 27 mEq/L (ref 19–32)
CO2: 23 mEq/L (ref 19–32)
Calcium: 8.4 mg/dL (ref 8.4–10.5)
Calcium: 8.4 mg/dL (ref 8.4–10.5)
Calcium: 8.4 mg/dL (ref 8.4–10.5)
Chloride: 107 mEq/L (ref 96–112)
Chloride: 110 mEq/L (ref 96–112)
Chloride: 109 mEq/L (ref 96–112)
Creatinine, Ser: 0.92 mg/dL (ref 0.4–1.2)
Creatinine, Ser: 0.96 mg/dL (ref 0.4–1.2)
GFR calc Af Amer: >60 mL/min (ref >60)
GFR calc Af Amer: >60 mL/min (ref >60)
GFR calc Af Amer: >60 mL/min (ref >60)
GFR calc Af Amer: >60 mL/min (ref >60)
GFR calc non Af Amer: >60 mL/min (ref >60)
GFR calc non Af Amer: >60 mL/min (ref >60)
Glucose, Bld: 167 mg/dL — ABNORMAL HIGH (ref 70–99)
Potassium: 3.6 mEq/L (ref 3.5–5.1)
Potassium: 4.1 mEq/L (ref 3.5–5.1)
Sodium: 138 mEq/L (ref 135–145)
Sodium: 141 mEq/L (ref 135–145)
Sodium: 140 mEq/L (ref 135–145)

## 2010-07-04 LAB — POCT I-STAT, CHEM 8
Calcium, Ion: 1.12 mmol/L (ref 1.12–1.32)
Creatinine, Ser: 0.9 mg/dL (ref 0.4–1.2)
HCT: 45 % (ref 36.0–46.0)
Potassium: 3.8 mEq/L (ref 3.5–5.1)
Sodium: 140 mEq/L (ref 135–145)

## 2010-07-04 LAB — CBC
HCT: 41.3 % (ref 36.0–46.0)
HCT: 41.5 % (ref 36.0–46.0)
Hemoglobin: 13.8 g/dL (ref 12.0–15.0)
Hemoglobin: 14.6 g/dL (ref 12.0–15.0)
Hemoglobin: 15 g/dL (ref 12.0–15.0)
Hemoglobin: 13.8 g/dL (ref 12.0–15.0)
MCHC: 32.9 g/dL (ref 30.0–36.0)
MCHC: 33 g/dL (ref 30.0–36.0)
MCHC: 33.1 g/dL (ref 30.0–36.0)
MCHC: 33.4 g/dL (ref 30.0–36.0)
MCHC: 33.3 g/dL (ref 30.0–36.0)
MCV: 84.9 fL (ref 78.0–100.0)
MCV: 85.2 fL (ref 78.0–100.0)
MCV: 85.7 fL (ref 78.0–100.0)
MCV: 84.2 fL (ref 78.0–100.0)
Platelets: 232 10*3/uL (ref 150–400)
Platelets: 280 10*3/uL (ref 150–400)
RBC: 4.92 MIL/uL (ref 3.87–5.11)
RBC: 5.02 MIL/uL (ref 3.87–5.11)
RBC: 5.18 MIL/uL — ABNORMAL HIGH (ref 3.87–5.11)
RBC: 5.36 MIL/uL — ABNORMAL HIGH (ref 3.87–5.11)
RBC: 4.93 MIL/uL (ref 3.87–5.11)
RDW: 14.8 % (ref 11.5–15.5)
WBC: 10.5 10*3/uL (ref 4.0–10.5)
WBC: 11.4 10*3/uL — ABNORMAL HIGH (ref 4.0–10.5)
WBC: 12.9 10*3/uL — ABNORMAL HIGH (ref 4.0–10.5)
WBC: 9 10*3/uL (ref 4.0–10.5)
WBC: 10.7 10*3/uL — ABNORMAL HIGH (ref 4.0–10.5)

## 2010-07-04 LAB — PHOSPHORUS: Phosphorus: 3.1 mg/dL (ref 2.3–4.6)

## 2010-07-04 LAB — HEPATIC FUNCTION PANEL
ALT: 52 U/L — ABNORMAL HIGH (ref 0–35)
ALT: 39 U/L — ABNORMAL HIGH (ref 0–35)
AST: 36 U/L (ref 0–37)
Albumin: 3.4 g/dL — ABNORMAL LOW (ref 3.5–5.2)
Alkaline Phosphatase: 65 U/L (ref 39–117)
Indirect Bilirubin: 0.8 mg/dL (ref 0.3–0.9)
Total Bilirubin: 1.1 mg/dL (ref 0.3–1.2)
Total Protein: 7 g/dL (ref 6.0–8.3)
Total Protein: 7.1 g/dL (ref 6.0–8.3)

## 2010-07-04 LAB — DIFFERENTIAL
Basophils Absolute: 0.1 10*3/uL (ref 0.0–0.1)
Basophils Relative: 0 % (ref 0–1)
Eosinophils Absolute: 0.1 10*3/uL (ref 0.0–0.7)
Eosinophils Absolute: 0.4 10*3/uL (ref 0.0–0.7)
Eosinophils Relative: 1 % (ref 0–5)
Lymphocytes Relative: 17 % (ref 12–46)
Lymphs Abs: 1.9 10*3/uL (ref 0.7–4.0)
Lymphs Abs: 1.5 10*3/uL (ref 0.7–4.0)
Monocytes Absolute: 0.1 10*3/uL (ref 0.1–1.0)
Monocytes Relative: 1 % — ABNORMAL LOW (ref 3–12)
Neutro Abs: 8.5 10*3/uL — ABNORMAL HIGH (ref 1.7–7.7)
Neutrophils Relative %: 75 % (ref 43–77)
Neutrophils Relative %: 81 % — ABNORMAL HIGH (ref 43–77)

## 2010-07-04 LAB — LIPID PANEL
LDL Cholesterol: 84 mg/dL (ref 0–99)
Total CHOL/HDL Ratio: 4.8 RATIO
Triglycerides: 111 mg/dL (ref <150)
VLDL: 22 mg/dL (ref 0–40)

## 2010-07-04 LAB — AMYLASE: Amylase: 123 U/L (ref 27–131)

## 2010-07-04 LAB — COMPREHENSIVE METABOLIC PANEL
ALT: 57 U/L — ABNORMAL HIGH (ref 0–35)
AST: 35 U/L (ref 0–37)
AST: 36 U/L (ref 0–37)
Albumin: 3 g/dL — ABNORMAL LOW (ref 3.5–5.2)
CO2: 29 mEq/L (ref 19–32)
Calcium: 8.1 mg/dL — ABNORMAL LOW (ref 8.4–10.5)
Calcium: 9.1 mg/dL (ref 8.4–10.5)
Chloride: 102 mEq/L (ref 96–112)
Chloride: 105 mEq/L (ref 96–112)
Creatinine, Ser: 0.98 mg/dL (ref 0.4–1.2)
Creatinine, Ser: 1 mg/dL (ref 0.4–1.2)
GFR calc Af Amer: >60 mL/min (ref >60)
GFR calc non Af Amer: >60 mL/min (ref >60)
Glucose, Bld: 92 mg/dL (ref 70–99)
Sodium: 138 mEq/L (ref 135–145)
Total Bilirubin: 0.4 mg/dL (ref 0.3–1.2)

## 2010-07-04 LAB — POCT CARDIAC MARKERS
CKMB, poc: 3.4 ng/mL (ref 1.0–8.0)
CKMB, poc: 2.4 ng/mL (ref 1.0–8.0)
Myoglobin, poc: 69.5 ng/mL (ref 12–200)
Troponin i, poc: <0.05 ng/mL (ref 0.00–0.09)
Troponin i, poc: <0.05 ng/mL (ref 0.00–0.09)

## 2010-07-04 LAB — URINALYSIS, ROUTINE W REFLEX MICROSCOPIC
Bilirubin Urine: NEGATIVE
Glucose, UA: NEGATIVE mg/dL
Hgb urine dipstick: NEGATIVE
Ketones, ur: NEGATIVE mg/dL
Ketones, ur: 15 mg/dL — AB
Nitrite: NEGATIVE
Nitrite: NEGATIVE
Protein, ur: NEGATIVE mg/dL
Protein, ur: NEGATIVE mg/dL
Specific Gravity, Urine: 1.01 (ref 1.005–1.030)
Specific Gravity, Urine: 1.02 (ref 1.005–1.030)
Urobilinogen, UA: 0.2 mg/dL (ref 0.0–1.0)
pH: 6 (ref 5.0–8.0)

## 2010-07-04 LAB — CARDIAC PANEL(CRET KIN+CKTOT+MB+TROPI)
CK, MB: 2.8 ng/mL (ref 0.3–4.0)
CK, MB: 3.2 ng/mL (ref 0.3–4.0)
Relative Index: 2.2 (ref 0.0–2.5)
Total CK: 142 U/L (ref 7–177)
Total CK: 148 U/L (ref 7–177)
Troponin I: 0.04 ng/mL (ref 0.00–0.06)

## 2010-07-04 LAB — POCT I-STAT 3, ART BLOOD GAS (G3+)
Acid-base deficit: 1 mmol/L (ref 0.0–2.0)
O2 Saturation: 86 %

## 2010-07-04 LAB — PREGNANCY, URINE: Preg Test, Ur: NEGATIVE

## 2010-07-04 LAB — URINE MICROSCOPIC-ADD ON

## 2010-07-04 LAB — POCT I-STAT 3, VENOUS BLOOD GAS (G3P V)
Acid-base deficit: 3 mmol/L — ABNORMAL HIGH (ref 0.0–2.0)
Bicarbonate: 22.9 mEq/L (ref 20.0–24.0)
TCO2: 24 mmol/L (ref 0–100)

## 2010-07-04 LAB — URINE CULTURE

## 2010-07-04 LAB — BRAIN NATRIURETIC PEPTIDE: Pro B Natriuretic peptide (BNP): 591 pg/mL — ABNORMAL HIGH (ref 0.0–100.0)

## 2010-07-04 LAB — LIPASE, BLOOD: Lipase: 64 U/L — ABNORMAL HIGH (ref 11–59)

## 2010-08-14 NOTE — Letter (Signed)
March 20, 2009     RE:  Katrina Carrillo, Katrina Carrillo  MRN:  [id number]  /  DOB:  Apr 26, 1965   To Whom It May Concern:   Katrina Carrillo is a 45 year old woman, who I follow in Cardiology Clinic.  She was recently hospitalized at [facility] for heart failure.  She has a severe cardiomyopathy with severe left ventricular  dysfunction.  With this, I have recommended that she obtain disability.  She is having difficulties with even activities of daily living that I  think working is beyond what she should do for now.  I am following her  closely at the Cardiology Clinic, and I will reassess her every couple  of months.    Sincerely,      Pricilla Riffle, MD, [facility]  Electronically Signed    PVR/MedQ  DD: 03/20/2009  DT: 03/21/2009  Job #: [id number]

## 2010-08-19 ENCOUNTER — Other Ambulatory Visit: Payer: Self-pay | Admitting: Internal Medicine

## 2010-09-03 ENCOUNTER — Encounter: Payer: Self-pay | Admitting: Internal Medicine

## 2010-09-20 ENCOUNTER — Ambulatory Visit (INDEPENDENT_AMBULATORY_CARE_PROVIDER_SITE_OTHER): Payer: Self-pay | Admitting: Internal Medicine

## 2010-09-20 ENCOUNTER — Encounter: Payer: Self-pay | Admitting: Internal Medicine

## 2010-09-20 VITALS — BP 119/78 | HR 74 | Resp 18 | Ht 63.0 in | Wt 191.8 lb

## 2010-09-20 DIAGNOSIS — I080 Rheumatic disorders of both mitral and aortic valves: Secondary | ICD-10-CM

## 2010-09-20 DIAGNOSIS — I509 Heart failure, unspecified: Secondary | ICD-10-CM

## 2010-09-20 DIAGNOSIS — I10 Essential (primary) hypertension: Secondary | ICD-10-CM

## 2010-09-20 NOTE — Patient Instructions (Signed)
Your physician has requested that you have an echocardiogram. Echocardiography is a painless test that uses sound waves to create images of your heart. It provides your doctor with information about the size and shape of your heart and how well your heart's chambers and valves are working. This procedure takes approximately one hour. There are no restrictions for this procedure.  Schedule for the end of July 2012 Your physician wants you to follow-up in: November 2012 with Dr.Ross You will receive a reminder letter in the mail two months in advance. If you don't receive a letter, please call our office to schedule the follow-up appointment.

## 2010-09-20 NOTE — Progress Notes (Signed)
HPI   Patient is 45 year old iwth a history of NICM.  I saw her back in February  Since seen her breathing is unchanged.  She does give out if she pushes herself too fast. No  PND.  NO edema. Complains of some bloating in epigastrum   Bowels have been irregular.  Allergies  Allergen Reactions  . Iodine   . Peanut-Containing Drug Products     Current Outpatient Prescriptions  Medication Sig Dispense Refill  . Calcium Carbonate-Vit D-Min 600-400 MG-UNIT TABS Take 1 tablet by mouth daily.        . carvedilol (COREG) 12.5 MG tablet Take 12.5 mg by mouth every 12 (twelve) hours as needed.        . digoxin (LANOXIN) 0.25 MG tablet TAKE ONE TABLET BY MOUTH EVERY DAY  30 tablet  12  . enalapril (VASOTEC) 5 MG tablet Take 5 mg by mouth 2 (two) times daily.        . furosemide (LASIX) 40 MG tablet Take 40 mg by mouth 2 (two) times daily.        . potassium chloride SA (K-DUR,KLOR-CON) 20 MEQ tablet Take 40 mEq by mouth 2 (two) times daily.          Past Medical History  Diagnosis Date  . Hypertension   . GERD (gastroesophageal reflux disease)   . Seasonal allergies   . Nonischemic cardiomyopathy     Past Surgical History  Procedure Date  . Tubal ligation     S/P    Family History  Problem Relation Age of Onset  . Coronary artery disease Other   . Hypertension Other   . Diabetes Other     History   Social History  . Marital Status: Single    Spouse Name: N/A    Number of Children: N/A  . Years of Education: N/A   Occupational History  . 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 Smoking status: Former Smoker    Types: Cigarettes  . Smokeless tobacco: Not on file  . Alcohol Use: No  . Drug Use: No  . Sexually Active: Not on file   Other Topics Concern  . Not on file   Social History Narrative   1 child    Review of Systems:  All systems reviewed.  They are negative to the above problem except as previously stated.  Vital  Signs: BP 119/78  Pulse 74  Resp 18  Ht 5\' 3"  (1.6 m)  Wt 191 lb 12.8 oz (87 kg)  BMI 33.98 kg/m2  Physical Exam  Patient is in NAD HEENT:  Normocephalic, atraumatic. EOMI, PERRLA.  Neck: JVP is normal. Thyroid mildly prominent.. No bruits.  Lungs: clear to auscultation. No rales no wheezes.  Heart: Regular rate and rhythm. Normal S1, S2. No S3.   No significant murmurs. PMI not displaced.  Abdomen:  Supple, nontender. Normal bowel sounds. No masses. No hepatomegaly.  Extremities:   Good distal pulses throughout. No lower extremity edema.  Musculoskeletal :moving all extremities.  Neuro:   alert and oriented x3.  CN II-XII grossly intact.   Assessment and Plan:

## 2010-09-20 NOTE — Assessment & Plan Note (Signed)
Volume status looks good.  I would recommend another echo to redefine LVEF. Has been seen by G. Ladona Ridgel who recommended.

## 2010-09-20 NOTE — Assessment & Plan Note (Signed)
Repeat echo

## 2010-09-20 NOTE — Assessment & Plan Note (Signed)
Continue meds. 

## 2010-10-04 ENCOUNTER — Other Ambulatory Visit (HOSPITAL_COMMUNITY): Payer: Self-pay | Admitting: Family Medicine

## 2010-10-04 ENCOUNTER — Other Ambulatory Visit: Payer: Self-pay | Admitting: Family Medicine

## 2010-10-04 DIAGNOSIS — E049 Nontoxic goiter, unspecified: Secondary | ICD-10-CM

## 2010-10-04 DIAGNOSIS — Z1231 Encounter for screening mammogram for malignant neoplasm of breast: Secondary | ICD-10-CM

## 2010-10-05 ENCOUNTER — Encounter: Payer: Self-pay | Admitting: Gastroenterology

## 2010-10-05 ENCOUNTER — Ambulatory Visit (HOSPITAL_COMMUNITY)
Admission: RE | Admit: 2010-10-05 | Discharge: 2010-10-05 | Disposition: A | Discharge status: Home / Self Care | Payer: Self-pay | Source: Ambulatory Visit | Attending: Family Medicine | Admitting: Family Medicine

## 2010-10-05 DIAGNOSIS — Z1231 Encounter for screening mammogram for malignant neoplasm of breast: Secondary | ICD-10-CM | POA: Insufficient documentation

## 2010-10-05 DIAGNOSIS — E049 Nontoxic goiter, unspecified: Secondary | ICD-10-CM

## 2010-10-22 ENCOUNTER — Other Ambulatory Visit (HOSPITAL_COMMUNITY): Payer: Self-pay

## 2010-10-22 ENCOUNTER — Ambulatory Visit (HOSPITAL_COMMUNITY): Payer: Self-pay | Attending: Internal Medicine | Admitting: Radiology

## 2010-10-22 DIAGNOSIS — I428 Other cardiomyopathies: Secondary | ICD-10-CM | POA: Insufficient documentation

## 2010-10-22 DIAGNOSIS — I059 Rheumatic mitral valve disease, unspecified: Secondary | ICD-10-CM | POA: Insufficient documentation

## 2010-10-22 DIAGNOSIS — I509 Heart failure, unspecified: Secondary | ICD-10-CM

## 2010-11-13 ENCOUNTER — Telehealth: Payer: Self-pay | Admitting: *Deleted

## 2010-11-13 NOTE — Telephone Encounter (Signed)
Cell mailbox full.  Sent no show letter

## 2010-11-27 ENCOUNTER — Other Ambulatory Visit: Payer: Self-pay | Admitting: Gastroenterology

## 2011-01-01 LAB — COMPREHENSIVE METABOLIC PANEL
BUN: 10
CO2: 28
Calcium: 9.5
Chloride: 104
Creatinine, Ser: 0.8
GFR calc Af Amer: >60
GFR calc non Af Amer: >60
Total Bilirubin: 0.4

## 2011-01-01 LAB — POCT CARDIAC MARKERS
CKMB, poc: 2.8
Myoglobin, poc: 70.1
Troponin i, poc: <0.05

## 2011-01-01 LAB — D-DIMER, QUANTITATIVE: D-Dimer, Quant: <0.22

## 2011-01-05 ENCOUNTER — Other Ambulatory Visit: Payer: Self-pay | Admitting: Internal Medicine

## 2011-01-09 ENCOUNTER — Other Ambulatory Visit: Payer: Self-pay | Admitting: *Deleted

## 2011-01-09 MED ORDER — ENALAPRIL MALEATE 5 MG PO TABS: 5.0000 mg | ORAL_TABLET | Freq: Two times a day (BID) | ORAL | Status: DC

## 2011-02-16 ENCOUNTER — Other Ambulatory Visit: Payer: Self-pay | Admitting: Internal Medicine

## 2011-02-19 ENCOUNTER — Other Ambulatory Visit: Payer: Self-pay

## 2011-02-19 MED ORDER — POTASSIUM CHLORIDE CRYS ER 20 MEQ PO TBCR: 40.0000 meq | EXTENDED_RELEASE_TABLET | Freq: Two times a day (BID) | ORAL | Status: DC

## 2011-02-19 NOTE — Telephone Encounter (Signed)
.   Requested Prescriptions   Signed Prescriptions Disp Refills  . potassium chloride SA (K-DUR,KLOR-CON) 20 MEQ tablet 120 tablet 5    Sig: Take 2 tablets (40 mEq total) by mouth 2 (two) times daily.    Authorizing Provider: Pricilla Riffle    Ordering User: Iven Finn S   Pt called to be sent to [facility] on [address].

## 2011-02-19 NOTE — Telephone Encounter (Signed)
Pt calling stating that she does not get her potassium from [facility], cxl that request, needs it called to [facility], pt is out and needs it today, please

## 2011-03-28 ENCOUNTER — Ambulatory Visit (INDEPENDENT_AMBULATORY_CARE_PROVIDER_SITE_OTHER): Payer: Self-pay | Admitting: Internal Medicine

## 2011-03-28 ENCOUNTER — Encounter: Payer: Self-pay | Admitting: Internal Medicine

## 2011-03-28 VITALS — BP 122/76 | HR 85 | Ht 63.0 in | Wt 183.4 lb

## 2011-03-28 DIAGNOSIS — I509 Heart failure, unspecified: Secondary | ICD-10-CM

## 2011-03-28 DIAGNOSIS — I10 Essential (primary) hypertension: Secondary | ICD-10-CM

## 2011-03-28 LAB — CBC WITH DIFFERENTIAL/PLATELET
Basophils Relative: 0.8 % (ref 0.0–3.0)
Eosinophils Absolute: 0.4 10*3/uL (ref 0.0–0.7)
Eosinophils Relative: 3.3 % (ref 0.0–5.0)
HCT: 38.1 % (ref 36.0–46.0)
Lymphs Abs: 1.8 10*3/uL (ref 0.7–4.0)
MCHC: 33.1 g/dL (ref 30.0–36.0)
MCV: 80.9 fl (ref 78.0–100.0)
Monocytes Absolute: 0.6 10*3/uL (ref 0.1–1.0)
Platelets: 261 10*3/uL (ref 150.0–400.0)
WBC: 13.6 10*3/uL — ABNORMAL HIGH (ref 4.5–10.5)

## 2011-03-28 LAB — BASIC METABOLIC PANEL
BUN: 9 mg/dL (ref 6–23)
CO2: 30 mEq/L (ref 19–32)
Chloride: 102 mEq/L (ref 96–112)
Creatinine, Ser: 0.8 mg/dL (ref 0.4–1.2)
Potassium: 3.9 mEq/L (ref 3.5–5.1)

## 2011-03-28 LAB — TSH: TSH: 0.93 u[IU]/mL (ref 0.35–5.50)

## 2011-03-28 MED ORDER — AZITHROMYCIN 250 MG PO TABS: ORAL_TABLET | ORAL | Status: AC

## 2011-03-28 NOTE — Assessment & Plan Note (Signed)
BP is adequate

## 2011-03-28 NOTE — Patient Instructions (Signed)
Lab work today. We will call you with results.  ROBITUSSIN DM cough syrup as needed

## 2011-03-28 NOTE — Assessment & Plan Note (Signed)
Last echo LVEF 35 to 40%.  Volume status looks good.  Will check labs. Consider aldactone.

## 2011-03-28 NOTE — Progress Notes (Signed)
HPI Patient is a 45 year old with a history of NICM and moderate MR  I sw her in clinic in June.  Was complaining of SOB with exertion and bloating. Echo showed LVEF was 35 to 40%.  MR was trivial. Sunday AM began with sore throat  Then cough.  Coughing out greenish fluid.   Nasal d/c is green.    Allergies  Allergen Reactions  . Iodine   . Peanut-Containing Drug Products     Current Outpatient Prescriptions  Medication Sig Dispense Refill  . Calcium Carbonate-Vit D-Min 600-400 MG-UNIT TABS Take 1 tablet by mouth daily.        . carvedilol (COREG) 12.5 MG tablet Take 6.25 mg by mouth 4 (four) times daily.       . cetirizine (ZYRTEC) 10 MG tablet Take 10 mg by mouth daily.        . digoxin (LANOXIN) 0.25 MG tablet TAKE ONE TABLET BY MOUTH EVERY DAY  30 tablet  12  . enalapril (VASOTEC) 5 MG tablet Take 1 tablet (5 mg total) by mouth 2 (two) times daily.  60 tablet  6  . furosemide (LASIX) 40 MG tablet TAKE ONE TABLET BY MOUTH TWICE DAILY  60 tablet  1  . Multiple Vitamin (MULTIVITAMIN) tablet Take 1 tablet by mouth daily.        . potassium chloride SA (K-DUR,KLOR-CON) 20 MEQ tablet Take 2 tablets (40 mEq total) by mouth 2 (two) times daily.  120 tablet  5    Past Medical History  Diagnosis Date  . Hypertension   . GERD (gastroesophageal reflux disease)   . Seasonal allergies   . Nonischemic cardiomyopathy     Past Surgical History  Procedure Date  . Tubal ligation     S/P    Family History  Problem Relation Age of Onset  . Coronary artery disease Other   . Hypertension Other   . Diabetes Other     History   Social History  . Marital Status: Single    Spouse Name: N/A    Number of Children: N/A  . Years of Education: N/A   Occupational History  . 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 Smoking status: Former Smoker    Types: Cigarettes  . Smokeless tobacco: Not on file  . Alcohol Use: No  . Drug Use: No  . Sexually  Active: Not on file   Other Topics Concern  . Not on file   Social History Narrative   1 child    Review of Systems:  All systems reviewed.  They are negative to the above problem except as previously stated.  Vital Signs: BP 122/76  Pulse 85  Ht 5\' 3"  (1.6 m)  Wt 183 lb 6.4 oz (83.19 kg)  BMI 32.49 kg/m2  Physical Exam Patient is in NAD  HEENT:  Normocephalic, atraumatic. EOMI, PERRLA.  Neck: JVP is normal. No thyromegaly. No bruits.  Lungs: clear to auscultation. No rales no wheezes.  Heart: Regular rate and rhythm. Normal S1, S2. No S3.   No significant murmurs. PMI not displaced.  Abdomen:  Supple, nontender. Normal bowel sounds. No masses. No hepatomegaly.  Extremities:   Good distal pulses throughout. No lower extremity edema.  Musculoskeletal :moving all extremities.  Neuro:   alert and oriented x3.  CN II-XII grossly intact.  EKG:  Sinus rhythhm.  79  St depression with T wave inversion II, III, AVF.  V3 to V6.   Assessment and Plan:

## 2011-03-29 ENCOUNTER — Telehealth: Payer: Self-pay | Admitting: Internal Medicine

## 2011-03-29 NOTE — Telephone Encounter (Signed)
Pt notified of lab results

## 2011-03-29 NOTE — Telephone Encounter (Signed)
FU Call: Pt returning call from our office regarding pt lab results. Please return pt call.

## 2011-03-29 NOTE — Telephone Encounter (Signed)
PT RTN CALL TO [facility] RE RESULTS

## 2011-04-29 ENCOUNTER — Other Ambulatory Visit: Payer: Self-pay | Admitting: Internal Medicine

## 2011-05-03 ENCOUNTER — Telehealth: Payer: Self-pay | Admitting: Internal Medicine

## 2011-05-03 NOTE — Telephone Encounter (Signed)
New Message:  Pt needs to discuss her meds and make sure we have them all ordered because when she went to get them she they said we had to call something in

## 2011-05-03 NOTE — Telephone Encounter (Signed)
LMOM for call back. 

## 2011-05-10 NOTE — Telephone Encounter (Signed)
Called patient again. Mail box full so unable to leave a message. Patient did not call back.

## 2011-09-10 ENCOUNTER — Ambulatory Visit (INDEPENDENT_AMBULATORY_CARE_PROVIDER_SITE_OTHER): Payer: Self-pay | Admitting: Physician Assistant

## 2011-09-10 ENCOUNTER — Encounter: Payer: Self-pay | Admitting: Physician Assistant

## 2011-09-10 VITALS — BP 110/64 | HR 56 | Ht 63.0 in | Wt 185.0 lb

## 2011-09-10 DIAGNOSIS — I080 Rheumatic disorders of both mitral and aortic valves: Secondary | ICD-10-CM

## 2011-09-10 DIAGNOSIS — I10 Essential (primary) hypertension: Secondary | ICD-10-CM

## 2011-09-10 DIAGNOSIS — R002 Palpitations: Secondary | ICD-10-CM

## 2011-09-10 DIAGNOSIS — I5022 Chronic systolic (congestive) heart failure: Secondary | ICD-10-CM

## 2011-09-10 LAB — BASIC METABOLIC PANEL
CO2: 30 mEq/L (ref 19–32)
Calcium: 9 mg/dL (ref 8.4–10.5)
Chloride: 102 mEq/L (ref 96–112)
Creatinine, Ser: 0.8 mg/dL (ref 0.4–1.2)
Glucose, Bld: 89 mg/dL (ref 70–99)
Sodium: 139 mEq/L (ref 135–145)

## 2011-09-10 MED ORDER — DIGOXIN 250 MCG PO TABS: 250.0000 ug | ORAL_TABLET | Freq: Every day | ORAL | Status: DC

## 2011-09-10 NOTE — Progress Notes (Signed)
[address] [place], [place]  [address] Phone: [phone] Fax:  [phone]  Date:  09/10/2011   Name:  Katrina Carrillo   DOB:  1965-09-06   MRN:  [id number]  PCP:  No primary provider on file.  Primary Cardiologist:  Dr. Dietrich Pates  Primary Electrophysiologist:  None    History of Present Illness: Katrina Carrillo is a 46 y.o. female who returns for follow up.  She has a history of nonischemic cardiomyopathy, hypertension, GERD.  Last LHC 01/2009: Normal coronary arteries, EF 15%. Last echo 09/2010: EF 35-40%, grade 2 diastolic dysfunction, trivial MR, mild LAE.  Last seen by Dr. Tenny Craw 03/2011.  Feeling well.  The patient denies chest pain, shortness of breath, syncope, orthopnea, PND or significant pedal edema.  Reports Class 2 symptoms.  Notes recent palpitations.  Describes as "flutters."  These are brief (seconds) and only occurred once last week.  No assoc symptoms.    Wt Readings from Last 3 Encounters:  09/10/11 185 lb (83.915 kg)  03/28/11 183 lb 6.4 oz (83.19 kg)  09/20/10 191 lb 12.8 oz (87 kg)     Potassium  Date/Time Value Range Status  03/28/2011 12:19 PM 3.9  3.5-5.1 (mEq/L) Final     Creatinine, Ser  Date/Time Value Range Status  03/28/2011 12:19 PM 0.8  0.4-1.2 (mg/dL) Final     ALT  Date/Time Value Range Status  06/11/2010 11:17 PM 23  0-35 (units/L) Final     TSH  Date/Time Value Range Status  03/28/2011 12:19 PM 0.93  0.35-5.50 (uIU/mL) Final     Hemoglobin  Date/Time Value Range Status  03/28/2011 12:19 PM 12.6  12.0-15.0 (g/dL) Final    Past Medical History  Diagnosis Date  . Hypertension   . GERD (gastroesophageal reflux disease)   . Seasonal allergies   . Nonischemic cardiomyopathy     LHC 01/2009: Normal coronary arteries, EF 15%. Last echo 09/2010: EF 35-40%, grade 2 diastolic dysfunction, trivial MR, mild LAE.    Current Outpatient Prescriptions  Medication Sig Dispense Refill  . Calcium  Carbonate-Vit D-Min 600-400 MG-UNIT TABS Take 1 tablet by mouth daily.        . carvedilol (COREG) 6.25 MG tablet TAKE TWO TABLETS BY MOUTH EVERY 12 HOURS  120 tablet  6  . cetirizine (ZYRTEC) 10 MG tablet Take 10 mg by mouth daily.        . digoxin (LANOXIN) 0.25 MG tablet TAKE ONE TABLET BY MOUTH EVERY DAY  30 tablet  12  . enalapril (VASOTEC) 5 MG tablet Take 1 tablet (5 mg total) by mouth 2 (two) times daily.  60 tablet  6  . furosemide (LASIX) 40 MG tablet TAKE ONE TABLET BY MOUTH TWICE DAILY  60 tablet  6  . Multiple Vitamin (MULTIVITAMIN) tablet Take 1 tablet by mouth daily.        . potassium chloride SA (K-DUR,KLOR-CON) 20 MEQ tablet Take 2 tablets (40 mEq total) by mouth 2 (two) times daily.  120 tablet  5  . DISCONTD: carvedilol (COREG) 12.5 MG tablet Take 6.25 mg by mouth 4 (four) times daily.         Allergies: Allergies  Allergen Reactions  . Iodine   . Peanut-Containing Drug Products     History  Substance Use Topics  . Smoking status: Former Smoker    Types: Cigarettes  . Smokeless tobacco: Not on file  . Alcohol Use: No     ROS:  Please see the history of present illness.    All other systems reviewed and negative.   PHYSICAL EXAM: VS:  BP 110/64  Pulse 56  Ht 5\' 3"  (1.6 m)  Wt 185 lb (83.915 kg)  BMI 32.77 kg/m2 Well nourished, well developed, in no acute distress HEENT: normal Neck: no JVD Cardiac:  normal S1, S2; RRR; no murmur Lungs:  clear to auscultation bilaterally, no wheezing, rhonchi or rales Abd: soft, nontender, no hepatomegaly Ext: no edema Skin: warm and dry Neuro:  CNs 2-12 intact, no focal abnormalities noted  EKG:  Sinus brady, HR 56, normal axis, no change from prior tracing   ASSESSMENT AND PLAN:  1.  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 Continue current Rx. BP too soft to push meds further. Check BMET, Mg2+, Digoxin level today. F/u in 6 mos with Dr. Dietrich Pates   2.  Hypertension Controlled.   Continue current therapy.   3.  Palpitations Probably PVC/PACs. Check labs as noted. HR slower but asymptomatic.  Check Dig level as noted.   Luna Glasgow, PA-C  9:40 AM 09/10/2011

## 2011-09-10 NOTE — Patient Instructions (Signed)
Your physician wants you to follow-up in: 6 MONTHS WITH DR. Tenny Craw. You will receive a reminder letter in the mail two months in advance. If you don't receive a letter, please call our office to schedule the follow-up appointment.   TODAY BMET, MAGNESIUM LEVEL, DIGOXIN LEVEL

## 2011-09-12 NOTE — Addendum Note (Signed)
Addended by: Reine Just on: 09/12/2011 04:13 PM   Modules accepted: Orders

## 2011-09-24 ENCOUNTER — Other Ambulatory Visit (INDEPENDENT_AMBULATORY_CARE_PROVIDER_SITE_OTHER): Payer: Self-pay

## 2011-09-24 DIAGNOSIS — I509 Heart failure, unspecified: Secondary | ICD-10-CM

## 2011-09-24 DIAGNOSIS — I5022 Chronic systolic (congestive) heart failure: Secondary | ICD-10-CM

## 2011-09-25 ENCOUNTER — Other Ambulatory Visit: Payer: Self-pay

## 2011-09-25 LAB — DIGOXIN LEVEL: Digoxin Level: 0.2 ng/mL — ABNORMAL LOW (ref 0.8–2.0)

## 2011-10-01 ENCOUNTER — Other Ambulatory Visit: Payer: Self-pay | Admitting: Internal Medicine

## 2011-10-19 IMAGING — NM NM PULM PERFUSION & VENT (REBREATHING & WASHOUT)
2 series · 12 of 12 positions shown · non-contrast
Comparison: Chest radiograph from 02/03/2009

CLINICAL DATA: Shortness of breath and cough

NUCLEAR MEDICINE VENTILATION - PERFUSION LUNG SCAN
TECHNIQUE: Wash-in, equilibrium, and wash-out phase ventilation
images were obtained using Be-OVV gas.  Perfusion images were
obtained in multiple projections after intravenous injection of Tc-
99m MAA.
Radiopharmaceuticals:  9.0 mCi Be-OVV gas and 6.6 mCi Zc-YYm MAA.

[vq scan · 2.52mm/px · 6 of 20 frames shown (1 of 2)]
[frame 2/20  full-range]
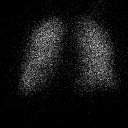
[frame 5/20  full-range]
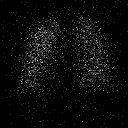
[frame 9/20]
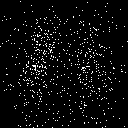
[frame 12/20]
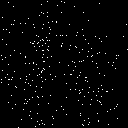
[frame 15/20]
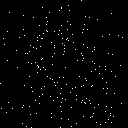
[frame 19/20]
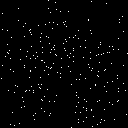

[vq scan · 2.52mm/px · 6 of 20 frames shown (2 of 2)]
[frame 2/20  full-range]
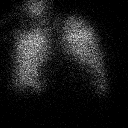
[frame 5/20  full-range]
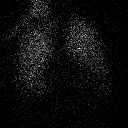
[frame 9/20]
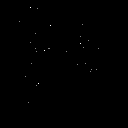
[frame 12/20]
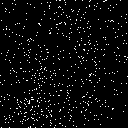
[frame 15/20]
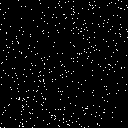
[frame 19/20]
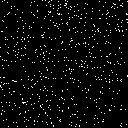

[12 of 12 positions shown; findings below may reference images not displayed]

FINDINGS: The chest x-ray from today shows bilateral pleural
effusions, interstitial edema and patchy airspace densities within
both lungs.

The ventilation portion of the examination shows a uniform
distribution of xenon tracer to both lungs.

On the washout images there are no abnormal areas of xenon tracer
retention.

No unmatched segmental perfusion defects are identified.
IMPRESSION: 1.  Very low probability for acute pulmonary embolus.

## 2011-11-01 ENCOUNTER — Telehealth: Payer: Self-pay | Admitting: Internal Medicine

## 2011-11-01 MED ORDER — POTASSIUM CHLORIDE CRYS ER 20 MEQ PO TBCR: 40.0000 meq | EXTENDED_RELEASE_TABLET | Freq: Two times a day (BID) | ORAL | Status: DC

## 2011-11-01 NOTE — Telephone Encounter (Signed)
This has been done. Left message for pt that this has been done.

## 2011-11-01 NOTE — Telephone Encounter (Signed)
Pt out of potassium and needs called into [facility]

## 2011-11-04 ENCOUNTER — Other Ambulatory Visit: Payer: Self-pay

## 2011-11-04 MED ORDER — POTASSIUM CHLORIDE CRYS ER 20 MEQ PO TBCR: 40.0000 meq | EXTENDED_RELEASE_TABLET | Freq: Two times a day (BID) | ORAL | Status: DC

## 2011-11-26 ENCOUNTER — Other Ambulatory Visit: Payer: Self-pay | Admitting: Obstetrics & Gynecology

## 2011-11-26 DIAGNOSIS — Z1231 Encounter for screening mammogram for malignant neoplasm of breast: Secondary | ICD-10-CM

## 2011-11-27 ENCOUNTER — Ambulatory Visit
Admission: RE | Admit: 2011-11-27 | Discharge: 2011-11-27 | Disposition: A | Discharge status: Home / Self Care | Payer: BC Managed Care – PPO | Source: Ambulatory Visit | Attending: Obstetrics & Gynecology | Admitting: Obstetrics & Gynecology

## 2011-11-27 DIAGNOSIS — Z1231 Encounter for screening mammogram for malignant neoplasm of breast: Secondary | ICD-10-CM

## 2011-11-28 ENCOUNTER — Other Ambulatory Visit: Payer: Self-pay | Admitting: Obstetrics & Gynecology

## 2011-11-28 DIAGNOSIS — N63 Unspecified lump in unspecified breast: Secondary | ICD-10-CM

## 2011-12-05 ENCOUNTER — Ambulatory Visit
Admission: RE | Admit: 2011-12-05 | Discharge: 2011-12-05 | Disposition: A | Discharge status: Home / Self Care | Payer: BC Managed Care – PPO | Source: Ambulatory Visit | Attending: Obstetrics & Gynecology | Admitting: Obstetrics & Gynecology

## 2011-12-05 ENCOUNTER — Other Ambulatory Visit: Payer: Self-pay | Admitting: Obstetrics & Gynecology

## 2011-12-05 DIAGNOSIS — N63 Unspecified lump in unspecified breast: Secondary | ICD-10-CM

## 2011-12-10 ENCOUNTER — Other Ambulatory Visit: Payer: Self-pay

## 2011-12-12 ENCOUNTER — Encounter: Payer: Self-pay | Admitting: Gastroenterology

## 2012-01-02 ENCOUNTER — Ambulatory Visit (AMBULATORY_SURGERY_CENTER): Payer: BC Managed Care – PPO | Admitting: *Deleted

## 2012-01-02 ENCOUNTER — Telehealth: Payer: Self-pay | Admitting: *Deleted

## 2012-01-02 VITALS — Ht 63.0 in | Wt 185.2 lb

## 2012-01-02 DIAGNOSIS — Z1211 Encounter for screening for malignant neoplasm of colon: Secondary | ICD-10-CM

## 2012-01-02 DIAGNOSIS — Z800 Family history of malignant neoplasm of digestive organs: Secondary | ICD-10-CM

## 2012-01-02 MED ORDER — NA SULFATE-K SULFATE-MG SULF 17.5-3.13-1.6 GM/177ML PO SOLN: ORAL | Status: DC

## 2012-01-02 NOTE — Telephone Encounter (Signed)
I think it best that I see her in the office

## 2012-01-02 NOTE — Telephone Encounter (Signed)
Dr Arlyce Dice, Pt had colonoscopy in 2007 with you for family hx colon cancer.  She now has CHF.  Last EF 35-40%.  Anesthesia guidelines say pt with EF of 35% cannot have procedure in [facility].  Do you want to see this pt in office?  Does she need to be scheduled at [facility]? Thanks, Olegario Messier

## 2012-01-02 NOTE — Telephone Encounter (Signed)
Left message for pt to call and scheduled OV.  Colonoscopy scheduled for 10/28 cancelled.

## 2012-01-06 ENCOUNTER — Telehealth: Payer: Self-pay

## 2012-01-06 NOTE — Telephone Encounter (Signed)
Pt plans to call and schedule OV

## 2012-01-17 ENCOUNTER — Encounter: Payer: BC Managed Care – PPO | Admitting: Gastroenterology

## 2012-01-21 ENCOUNTER — Telehealth: Payer: Self-pay | Admitting: Gastroenterology

## 2012-01-21 NOTE — Telephone Encounter (Signed)
Scheduled patient to come for OV to discuss colonoscopy as per previous phone notes. Scheduled on 02/17/12 at 10:30 AM.

## 2012-01-23 ENCOUNTER — Encounter: Payer: BC Managed Care – PPO | Admitting: Gastroenterology

## 2012-01-26 ENCOUNTER — Other Ambulatory Visit: Payer: Self-pay | Admitting: Internal Medicine

## 2012-01-27 ENCOUNTER — Encounter: Payer: BC Managed Care – PPO | Admitting: Gastroenterology

## 2012-02-04 ENCOUNTER — Telehealth: Payer: Self-pay | Admitting: Internal Medicine

## 2012-02-04 NOTE — Telephone Encounter (Signed)
Pt need refill of furosimide at [facility]

## 2012-02-04 NOTE — Telephone Encounter (Signed)
Patient called earlier about medication refill for furosemide. Pharmacy states she has refills, she has to request refill and they will fill rx. Was unable to reach patient and answering service is said to be full.     Micki Riley, CMA

## 2012-02-14 ENCOUNTER — Encounter: Payer: Self-pay | Admitting: Physician Assistant

## 2012-02-14 ENCOUNTER — Ambulatory Visit (INDEPENDENT_AMBULATORY_CARE_PROVIDER_SITE_OTHER): Payer: BC Managed Care – PPO | Admitting: Physician Assistant

## 2012-02-14 VITALS — BP 118/82 | HR 63 | Ht 63.0 in | Wt 189.0 lb

## 2012-02-14 DIAGNOSIS — R252 Cramp and spasm: Secondary | ICD-10-CM

## 2012-02-14 DIAGNOSIS — R202 Paresthesia of skin: Secondary | ICD-10-CM

## 2012-02-14 DIAGNOSIS — I5022 Chronic systolic (congestive) heart failure: Secondary | ICD-10-CM

## 2012-02-14 DIAGNOSIS — R209 Unspecified disturbances of skin sensation: Secondary | ICD-10-CM

## 2012-02-14 DIAGNOSIS — I10 Essential (primary) hypertension: Secondary | ICD-10-CM

## 2012-02-14 DIAGNOSIS — R079 Chest pain, unspecified: Secondary | ICD-10-CM

## 2012-02-14 LAB — BASIC METABOLIC PANEL
BUN: 11 mg/dL (ref 6–23)
CO2: 32 mEq/L (ref 19–32)
Calcium: 9.2 mg/dL (ref 8.4–10.5)
Chloride: 102 mEq/L (ref 96–112)
Creatinine, Ser: 0.8 mg/dL (ref 0.4–1.2)
Glucose, Bld: 101 mg/dL — ABNORMAL HIGH (ref 70–99)

## 2012-02-14 NOTE — Patient Instructions (Addendum)
Your physician recommends that you return for lab work in: TODAY BMET, TSH  KEEP FOLLOW UP APPT WITH DR. ROSS 03/13/12 @ 10:15   NO CHANGES WERE MADE TODAY

## 2012-02-14 NOTE — Progress Notes (Signed)
[address] [place], [place]  [address] Phone: [phone]; Fax:  [phone]  Date:  02/14/2012   Name:  Katrina Carrillo   DOB:  Aug 14, 1965   MRN:  [id number]  PCP:  No primary provider on file.  Primary Cardiologist:  Dr. Dietrich Pates  Primary Electrophysiologist:  None    History of Present Illness: Katrina Carrillo is a 46 y.o. female who returns for evaluation of chest and leg pain.  She has a hx of NICM, HTN, GERD.  LHC 01/2009: Normal coronary arteries, EF 15%. Echo 09/2010: EF 35-40%, grade 2 diastolic dysfunction, trivial MR, mild LAE.  I saw her in June 2013.  She notes problems with left chest pain and left leg pain over the last few weeks.  She denies exertional chest pain.  No significant dyspnea.  She does note dyspnea with more extreme activities.  No syncope.  No orthopnea, PND.  No significant edema.  Pain in chest noted with positional changes and with palpation.  Pain in left leg is located post to knee.  Notes pain with activity.  No true symptoms of claudication.  May have some cramping.  No injuries.    Labs (6/13):    K 4, creatinine 0.8, digoxin level 0.2  Wt Readings from Last 3 Encounters:  02/14/12 189 lb (85.73 kg)  01/02/12 185 lb 3.2 oz (84.006 kg)  09/10/11 185 lb (83.915 kg)      Past Medical History  Diagnosis Date  . Hypertension   . GERD (gastroesophageal reflux disease)   . Seasonal allergies   . Nonischemic cardiomyopathy     LHC 01/2009: Normal coronary arteries, EF 15%. Last echo 09/2010: EF 35-40%, grade 2 diastolic dysfunction, trivial MR, mild LAE.    Current Outpatient Prescriptions  Medication Sig Dispense Refill  . Calcium Carbonate-Vit D-Min 600-400 MG-UNIT TABS Take 1 tablet by mouth daily.        . carvedilol (COREG) 6.25 MG tablet TAKE TWO TABLETS BY MOUTH EVERY 12 HOURS  120 tablet  6  . digoxin (LANOXIN) 0.25 MG tablet Take 1 tablet (250 mcg total) by mouth daily.  30 tablet  11  .  enalapril (VASOTEC) 5 MG tablet TAKE ONE TABLET BY MOUTH TWICE DAILY  60 tablet  5  . furosemide (LASIX) 40 MG tablet TAKE ONE TABLET BY MOUTH TWICE DAILY  60 tablet  5  . loratadine (CLARITIN) 10 MG tablet Take 10 mg by mouth daily.      . Multiple Vitamin (MULTIVITAMIN) tablet Take 1 tablet by mouth daily.        . Na Sulfate-K Sulfate-Mg Sulf (SUPREP BOWEL PREP) SOLN suprep as directed.  No substitutions  354 mL  0  . potassium chloride SA (K-DUR,KLOR-CON) 20 MEQ tablet Take 2 tablets (40 mEq total) by mouth 2 (two) times daily.  120 tablet  5    Allergies: Allergies  Allergen Reactions  . Peanut-Containing Drug Products Anaphylaxis  . Shellfish Allergy Anaphylaxis  . Iodine Swelling and Rash   Social History:   The patient  reports that she has quit smoking. Her smoking use included Cigarettes. She has never used smokeless tobacco. She reports that she does not drink alcohol or use illicit drugs.   ROS:  Please see the history of present illness.  All other systems reviewed and negative.   PHYSICAL EXAM: VS:  BP 118/82  Pulse 63  Ht 5\' 3"  (1.6 m)  Wt 189 lb (85.73  kg)  BMI 33.48 kg/m2 Well nourished, well developed, in no acute distress HEENT: normal Neck: no JVD Cardiac:  normal S1, S2; RRR; no murmur Chest:  +/- pain with palpation over left chest Lungs:  clear to auscultation bilaterally, no wheezing, rhonchi or rales Abd: soft, nontender, no hepatomegaly Ext: no edema MSK:  Left knee without deformity, no crepitus Vascular:  DP/PT 2+ bilaterally Skin: warm and dry Neuro:  CNs 2-12 intact, no focal abnormalities noted  EKG:  NSR, HR 63, normal axis, T-wave inversions in 2, 3, aVF, no change from prior tracing   ASSESSMENT AND PLAN:  1. Chronic Systolic CHF:   Volume stable.  Continue current regimen.  Check BMET. Follow up with Dr. Dietrich Pates as planned.   2. Hypertension:  Controlled.  Continue current therapy.   3. Chest Pain:   Atypical.  Normal cors in 2010.   Likely MSK pain.  We discussed conservative measures (heat, tylenol, etc).  No cardiac workup is warranted.  4. Leg Pain:  Likely describing a strain.  As noted, proceed with conservative measures. She has some tingling (?paresthesia).  Check TSH.  Pulses are good.  Check BMET to rule out hypokalemia.  Otherwise, no further workup.    Signed, Tereso Newcomer, PA-C  10:18 AM 02/14/2012

## 2012-02-17 ENCOUNTER — Encounter: Payer: Self-pay | Admitting: Gastroenterology

## 2012-02-17 ENCOUNTER — Telehealth: Payer: Self-pay | Admitting: *Deleted

## 2012-02-17 ENCOUNTER — Ambulatory Visit (INDEPENDENT_AMBULATORY_CARE_PROVIDER_SITE_OTHER): Payer: BC Managed Care – PPO | Admitting: Gastroenterology

## 2012-02-17 VITALS — BP 124/70 | HR 72 | Ht 62.5 in | Wt 189.5 lb

## 2012-02-17 DIAGNOSIS — I509 Heart failure, unspecified: Secondary | ICD-10-CM

## 2012-02-17 DIAGNOSIS — Z800 Family history of malignant neoplasm of digestive organs: Secondary | ICD-10-CM | POA: Insufficient documentation

## 2012-02-17 HISTORY — DX: Family history of malignant neoplasm of digestive organs: Z80.0

## 2012-02-17 MED ORDER — NA SULFATE-K SULFATE-MG SULF 17.5-3.13-1.6 GM/177ML PO SOLN: 1.0000 | Freq: Once | ORAL | Status: DC

## 2012-02-17 NOTE — Assessment & Plan Note (Signed)
Plan followup colonoscopy 

## 2012-02-17 NOTE — Patient Instructions (Addendum)
Your procedure has been scheduled Separate instructions have been given  Colonoscopy A colonoscopy is an exam to evaluate your entire colon. In this exam, your colon is cleansed. A long fiberoptic tube is inserted through your rectum and into your colon. The fiberoptic scope (endoscope) is a long bundle of enclosed and very flexible fibers. These fibers transmit light to the area examined and send images from that area to your caregiver. Discomfort is usually minimal. You may be given a drug to help you sleep (sedative) during or prior to the procedure. This exam helps to detect lumps (tumors), polyps, inflammation, and areas of bleeding. Your caregiver may also take a small piece of tissue (biopsy) that will be examined under a microscope. LET YOUR CAREGIVER KNOW ABOUT:   Allergies to food or medicine.  Medicines taken, including vitamins, herbs, eyedrops, over-the-counter medicines, and creams.  Use of steroids (by mouth or creams).  Previous problems with anesthetics or numbing medicines.  History of bleeding problems or blood clots.  Previous surgery.  Other health problems, including diabetes and kidney problems.  Possibility of pregnancy, if this applies. BEFORE THE PROCEDURE   A clear liquid diet may be required for 2 days before the exam.  Ask your caregiver about changing or stopping your regular medications.  Liquid injections (enemas) or laxatives may be required.  A large amount of electrolyte solution may be given to you to drink over a short period of time. This solution is used to clean out your colon.  You should be present 60 minutes prior to your procedure or as directed by your caregiver. AFTER THE PROCEDURE   If you received a sedative or pain relieving medication, you will need to arrange for someone to drive you home.  Occasionally, there is a little blood passed with the first bowel movement. Do not be concerned. FINDING OUT THE RESULTS OF YOUR TEST Not  all test results are available during your visit. If your test results are not back during the visit, make an appointment with your caregiver to find out the results. Do not assume everything is normal if you have not heard from your caregiver or the medical facility. It is important for you to follow up on all of your test results. HOME CARE INSTRUCTIONS   It is not unusual to pass moderate amounts of gas and experience mild abdominal cramping following the procedure. This is due to air being used to inflate your colon during the exam. Walking or a warm pack on your belly (abdomen) may help.  You may resume all normal meals and activities after sedatives and medicines have worn off.  Only take over-the-counter or prescription medicines for pain, discomfort, or fever as directed by your caregiver. Do not use aspirin or blood thinners if a biopsy was taken. Consult your caregiver for medicine usage if biopsies were taken. SEEK IMMEDIATE MEDICAL CARE IF:   You have a fever.  You pass large blood clots or fill a toilet with blood following the procedure. This may also occur 10 to 14 days following the procedure. This is more likely if a biopsy was taken.  You develop abdominal pain that keeps getting worse and cannot be relieved with medicine. Document Released: 03/15/2000 Document Revised: 06/10/2011 Document Reviewed: 10/29/2007 [facility] Patient Information 2013 [place], [place].

## 2012-02-17 NOTE — Assessment & Plan Note (Signed)
Patient has a nonischemic cardiomyopathy.  Last echo July, 2012 demonstrated an EF of 35-40%. There have been no active cardiac issues over the past year.

## 2012-02-17 NOTE — Progress Notes (Signed)
History of Present Illness:  Pleasant 46 year old Afro-American female with family history of colorectal CA here to schedule followup colonoscopy. 2007 colonoscopy demonstrated scattered diverticula. On one occasion several months ago she saw a small of blood on the tissue following a bowel movement. Otherwise, she has had no GI complaints including abdominal pain or change of bowel habits. The patient has a cardiomyopathy diagnosed in 2010. Her latest echocardiogram demonstrated an EF of 35-40%. The patient denies chest pain or shortness of breath. She's not had active cardiac issues for almost 2 years.  Family history is pertinent for her father who at colon cancer in his 47s    Past Medical History  Diagnosis Date  . Hypertension   . GERD (gastroesophageal reflux disease)   . Seasonal allergies   . Nonischemic cardiomyopathy     LHC 01/2009: Normal coronary arteries, EF 15%. Last echo 09/2010: EF 35-40%, grade 2 diastolic dysfunction, trivial MR, mild LAE.  Marland Kitchen Diverticulosis    Past Surgical History  Procedure Date  . Tubal ligation     S/P   family history includes Colon cancer (age of onset:50) in her father; Coronary artery disease in her father; Diabetes in her mother; and Hypertension in her other.  There is no history of Stomach cancer. Current Outpatient Prescriptions  Medication Sig Dispense Refill  . Calcium Carbonate-Vit D-Min 600-400 MG-UNIT TABS Take 1 tablet by mouth daily.        . carvedilol (COREG) 6.25 MG tablet TAKE TWO TABLETS BY MOUTH EVERY 12 HOURS  120 tablet  6  . digoxin (LANOXIN) 0.25 MG tablet Take 1 tablet (250 mcg total) by mouth daily.  30 tablet  11  . enalapril (VASOTEC) 5 MG tablet TAKE ONE TABLET BY MOUTH TWICE DAILY  60 tablet  5  . furosemide (LASIX) 40 MG tablet TAKE ONE TABLET BY MOUTH TWICE DAILY  60 tablet  5  . loratadine (CLARITIN) 10 MG tablet Take 10 mg by mouth daily.      . Multiple Vitamin (MULTIVITAMIN) tablet Take 1 tablet by mouth daily.         . potassium chloride SA (K-DUR,KLOR-CON) 20 MEQ tablet Take 2 tablets (40 mEq total) by mouth 2 (two) times daily.  120 tablet  5   Allergies as of 02/17/2012 - Review Complete 02/17/2012  Allergen Reaction Noted  . Peanut-containing drug products Anaphylaxis 02/27/2009  . Shellfish allergy Anaphylaxis 01/02/2012  . Iodine Swelling and Rash 02/27/2009    reports that she quit smoking about 23 years ago. Her smoking use included Cigarettes. She quit after 2 years of use. She has never used smokeless tobacco. She reports that she drinks alcohol. She reports that she does not use illicit drugs.     Review of Systems: Pertinent positive and negative review of systems were noted in the above HPI section. All other review of systems were otherwise negative.  Vital signs were reviewed in today's medical record Physical Exam: General: Well developed , well nourished, no acute distress Head: Normocephalic and atraumatic Eyes:  sclerae anicteric, EOMI Ears: Normal auditory acuity Mouth: No deformity or lesions Neck: Supple, no masses or thyromegaly Lungs: Clear throughout to auscultation Heart: Regular rate and rhythm; no murmurs, rubs or bruits Abdomen: Soft, non tender and non distended. No masses, hepatosplenomegaly or hernias noted. Normal Bowel sounds Rectal:deferred Musculoskeletal: Symmetrical with no gross deformities  Skin: No lesions on visible extremities Pulses:  Normal pulses noted Extremities: No clubbing, cyanosis, edema or deformities noted Neurological: Alert  oriented x 4, grossly nonfocal Cervical Nodes:  No significant cervical adenopathy Inguinal Nodes: No significant inguinal adenopathy Psychological:  Alert and cooperative. Normal mood and affect

## 2012-02-17 NOTE — Telephone Encounter (Signed)
Message copied by Tarri Fuller on Mon Feb 17, 2012  9:40 AM ------      Message from: [place], [place] T      Created: Sat Feb 15, 2012  3:06 PM       Potassium and kidney function look good.      TSH is normal.      Continue with current treatment plan.      Tereso Newcomer, PA-C  4:49 PM 02/13/2012

## 2012-02-17 NOTE — Telephone Encounter (Signed)
pt notified about lab results w/verbal understanding today 

## 2012-03-11 ENCOUNTER — Other Ambulatory Visit: Payer: Self-pay | Admitting: Internal Medicine

## 2012-03-12 ENCOUNTER — Other Ambulatory Visit: Payer: Self-pay

## 2012-03-12 MED ORDER — CARVEDILOL 6.25 MG PO TABS: 6.2500 mg | ORAL_TABLET | Freq: Two times a day (BID) | ORAL | Status: DC

## 2012-03-13 ENCOUNTER — Other Ambulatory Visit: Payer: Self-pay

## 2012-03-13 ENCOUNTER — Ambulatory Visit: Payer: BC Managed Care – PPO | Admitting: Internal Medicine

## 2012-03-13 MED ORDER — CARVEDILOL 6.25 MG PO TABS: 6.2500 mg | ORAL_TABLET | Freq: Two times a day (BID) | ORAL | Status: DC

## 2012-03-13 NOTE — Telephone Encounter (Signed)
New Problem:    Patient called in returning a call she received form our office yesterday about her medication.  Please call back.

## 2012-03-13 NOTE — Telephone Encounter (Signed)
Follow-up:    Patient is calling in because by the end of the day she wants to make sure that she has some medication to pick up from the drug store.

## 2012-03-13 NOTE — Telephone Encounter (Signed)
LMOM that refill for Coreg will be sent in but she needs to call back to reschedule appointment with Dr. Tenny Craw.

## 2012-03-19 ENCOUNTER — Encounter: Payer: Self-pay | Admitting: Gastroenterology

## 2012-03-19 ENCOUNTER — Ambulatory Visit (AMBULATORY_SURGERY_CENTER): Payer: BC Managed Care – PPO | Admitting: Gastroenterology

## 2012-03-19 VITALS — BP 120/70 | HR 72 | Temp 98.0°F | Resp 30 | Ht 62.0 in | Wt 189.0 lb

## 2012-03-19 DIAGNOSIS — Z800 Family history of malignant neoplasm of digestive organs: Secondary | ICD-10-CM

## 2012-03-19 MED ORDER — SODIUM CHLORIDE 0.9 % IV SOLN: 500.0000 mL | INTRAVENOUS | Status: DC

## 2012-03-19 NOTE — Op Note (Signed)
[facility] [address] [place], [address]   COLONOSCOPY PROCEDURE REPORT  PATIENT: Katrina Carrillo, Katrina Carrillo  MR#: [id number] BIRTHDATE: 1965-10-02 , 46  yrs. old GENDER: Female ENDOSCOPIST: Louis Meckel, MD REFERRED XB:JYNWG Ronnell Freshwater, M.D. PROCEDURE DATE:  03/19/2012 PROCEDURE:   Colonoscopy, diagnostic ASA CLASS:   Class II INDICATIONS:Patient's immediate family history of colon cancer and father, colon cancer early 38s. MEDICATIONS: MAC sedation, administered by CRNA and propofol (Diprivan) 200mg  IV  DESCRIPTION OF PROCEDURE:   After the risks benefits and alternatives of the procedure were thoroughly explained, informed consent was obtained.  A digital rectal exam revealed no abnormalities of the rectum.   The LB [id number]  endoscope was introduced through the anus and advanced to the cecum, which was identified by both the appendix and ileocecal valve. No adverse events experienced.   The quality of the prep was Suprep excellent The instrument was then slowly withdrawn as the colon was fully examined.      COLON FINDINGS: A normal appearing cecum, ileocecal valve, and appendiceal orifice were identified.  The ascending, hepatic flexure, transverse, splenic flexure, descending, sigmoid colon and rectum appeared unremarkable.  No polyps or cancers were seen. Retroflexed views revealed no abnormalities. The time to cecum=2 minutes 07 seconds.  Withdrawal time=6 minutes 25 seconds.  The scope was withdrawn and the procedure completed. COMPLICATIONS: There were no complications.  ENDOSCOPIC IMPRESSION: Normal colon  RECOMMENDATIONS: Given your significant family history of colon cancer, you should have a repeat colonoscopy in 5 years   eSigned:  Louis Meckel, MD 03/19/2012 4:39 PM   cc:

## 2012-03-19 NOTE — Patient Instructions (Addendum)
YOU HAD AN ENDOSCOPIC PROCEDURE TODAY AT THE [facility]: Refer to the procedure report that was given to you for any specific questions about what was found during the examination.  If the procedure report does not answer your questions, please call your gastroenterologist to clarify.  If you requested that your care partner not be given the details of your procedure findings, then the procedure report has been included in a sealed envelope for you to review at your convenience later.  YOU SHOULD EXPECT: Some feelings of bloating in the abdomen. Passage of more gas than usual.  Walking can help get rid of the air that was put into your GI tract during the procedure and reduce the bloating. If you had a lower endoscopy (such as a colonoscopy or flexible sigmoidoscopy) you may notice spotting of blood in your stool or on the toilet paper. If you underwent a bowel prep for your procedure, then you may not have a normal bowel movement for a few days.  DIET: Your first meal following the procedure should be a light meal and then it is ok to progress to your normal diet.  A half-sandwich or bowl of soup is an example of a good first meal.  Heavy or fried foods are harder to digest and may make you feel nauseous or bloated.  Likewise meals heavy in dairy and vegetables can cause extra gas to form and this can also increase the bloating.  Drink plenty of fluids but you should avoid alcoholic beverages for 24 hours.  ACTIVITY: Your care partner should take you home directly after the procedure.  You should plan to take it easy, moving slowly for the rest of the day.  You can resume normal activity the day after the procedure however you should NOT DRIVE or use heavy machinery for 24 hours (because of the sedation medicines used during the test).    SYMPTOMS TO REPORT IMMEDIATELY: A gastroenterologist can be reached at any hour.  During normal business hours, 8:30 AM to 5:00 PM Monday through Friday,  call [phone].  After hours and on weekends, please call the GI answering service at [phone] who will take a message and have the physician on call contact you.   Following lower endoscopy (colonoscopy or flexible sigmoidoscopy):  Excessive amounts of blood in the stool  Significant tenderness or worsening of abdominal pains  Swelling of the abdomen that is new, acute  Fever of 100F or higher  Following upper endoscopy (EGD)  Vomiting of blood or coffee ground material  New chest pain or pain under the shoulder blades  Painful or persistently difficult swallowing  New shortness of breath  Fever of 100F or higher  Black, tarry-looking stools  FOLLOW UP: If any biopsies were taken you will be contacted by phone or by letter within the next 1-3 weeks.  Call your gastroenterologist if you have not heard about the biopsies in 3 weeks.  Our staff will call the home number listed on your records the next business day following your procedure to check on you and address any questions or concerns that you may have at that time regarding the information given to you following your procedure. This is a courtesy call and so if there is no answer at the home number and we have not heard from you through the emergency physician on call, we will assume that you have returned to your regular daily activities without incident.  SIGNATURES/CONFIDENTIALITY: You and/or your care   partner have signed paperwork which will be entered into your electronic medical record.  These signatures attest to the fact that that the information above on your After Visit Summary has been reviewed and is understood.  Full responsibility of the confidentiality of this discharge information lies with you and/or your care-partner.   Thank-you for choosing us for your healthcare needs. 

## 2012-03-19 NOTE — Progress Notes (Signed)
Propofol given over incremental dosages 

## 2012-03-19 NOTE — Progress Notes (Signed)
Patient did not have preoperative order for IV antibiotic SSI prophylaxis. (G8918)  Patient did not experience any of the following events: a burn prior to discharge; a fall within the facility; wrong site/side/patient/procedure/implant event; or a hospital transfer or hospital admission upon discharge from the facility. (G8907)  

## 2012-03-20 ENCOUNTER — Telehealth: Payer: Self-pay | Admitting: *Deleted

## 2012-03-20 NOTE — Telephone Encounter (Signed)
Number identifier, unable to leave message, mail box full, follow-up

## 2012-06-23 ENCOUNTER — Telehealth: Payer: Self-pay | Admitting: Internal Medicine

## 2012-06-23 NOTE — Telephone Encounter (Signed)
New Problem:    Patient called in needing a refill of her enalapril (VASOTEC) 5 MG tablet sent into the [facility] on [address]  Please call back if you have any questions.  This is the patient's new permanent pharmacy.

## 2012-06-25 ENCOUNTER — Telehealth: Payer: Self-pay | Admitting: Internal Medicine

## 2012-06-25 NOTE — Telephone Encounter (Signed)
Currently being handled by refill person.

## 2012-06-25 NOTE — Telephone Encounter (Signed)
New problem    Pt stated she is having problems getting her medicines and she hasn't taken any in two days. She need someone to help her. Pt stated she has left more than 2 messages on the refill line and that is the reason she isn't getting her medicine.

## 2012-06-26 ENCOUNTER — Telehealth: Payer: Self-pay | Admitting: Internal Medicine

## 2012-06-26 ENCOUNTER — Other Ambulatory Visit: Payer: Self-pay | Admitting: *Deleted

## 2012-06-26 MED ORDER — ENALAPRIL MALEATE 5 MG PO TABS: 5.0000 mg | ORAL_TABLET | Freq: Every day | ORAL | Status: DC

## 2012-06-26 NOTE — Telephone Encounter (Signed)
Refilled medication myself.  Pt has appt on Monday, March 31 with Dr. Tenny Craw.

## 2012-06-26 NOTE — Telephone Encounter (Signed)
New problem   Pt stated she went to pharmacy to get her medication and again it wasn't there. Pt spoke to a nurse yesterday but don't know who and they assured her that the prescription would be at pharmacy. Pt want to know what can she do, because she can't go the weekend without her meds.

## 2012-06-29 ENCOUNTER — Ambulatory Visit (INDEPENDENT_AMBULATORY_CARE_PROVIDER_SITE_OTHER): Payer: BC Managed Care – PPO | Admitting: Internal Medicine

## 2012-06-29 ENCOUNTER — Encounter: Payer: Self-pay | Admitting: Internal Medicine

## 2012-06-29 VITALS — BP 120/72 | HR 64 | Ht 63.0 in | Wt 191.0 lb

## 2012-06-29 DIAGNOSIS — I10 Essential (primary) hypertension: Secondary | ICD-10-CM

## 2012-06-29 DIAGNOSIS — I5022 Chronic systolic (congestive) heart failure: Secondary | ICD-10-CM

## 2012-06-29 LAB — BASIC METABOLIC PANEL
BUN: 10 mg/dL (ref 6–23)
CO2: 31 mEq/L (ref 19–32)
Calcium: 8.9 mg/dL (ref 8.4–10.5)
Creatinine, Ser: 0.7 mg/dL (ref 0.4–1.2)
GFR: 115.12 mL/min (ref >60.00)
Glucose, Bld: 97 mg/dL (ref 70–99)
Sodium: 135 mEq/L (ref 135–145)

## 2012-06-29 MED ORDER — CARVEDILOL 6.25 MG PO TABS: 6.2500 mg | ORAL_TABLET | Freq: Two times a day (BID) | ORAL | Status: DC

## 2012-06-29 MED ORDER — POTASSIUM CHLORIDE CRYS ER 20 MEQ PO TBCR: 40.0000 meq | EXTENDED_RELEASE_TABLET | Freq: Two times a day (BID) | ORAL | Status: DC

## 2012-06-29 MED ORDER — FUROSEMIDE 40 MG PO TABS: 40.0000 mg | ORAL_TABLET | Freq: Two times a day (BID) | ORAL | Status: DC

## 2012-06-29 MED ORDER — DIGOXIN 250 MCG PO TABS: 250.0000 ug | ORAL_TABLET | Freq: Every day | ORAL | Status: DC

## 2012-06-29 MED ORDER — ENALAPRIL MALEATE 5 MG PO TABS: 5.0000 mg | ORAL_TABLET | Freq: Two times a day (BID) | ORAL | Status: DC

## 2012-06-29 NOTE — Progress Notes (Signed)
HPI Patient is a 47 yo with  a hx of NICM, HTN, GERD. LHC 01/2009: Normal coronary arteries, EF 15%. Echo 09/2010: EF 35-40%, grade 2 diastolic dysfunction, trivial MR, mild LAE. She was last seen in clinic by Wende Mott in November SInce seen her breathing has been stable  She does not exercise regularly Says some of SOB may be because she is overweight.  Would like to exercise  Scared Working 8 to 5 No edema  No CP  NO orthopnea  Allergies  Allergen Reactions  . Peanut-Containing Drug Products Anaphylaxis  . Shellfish Allergy Anaphylaxis  . Iodine Swelling and Rash    Current Outpatient Prescriptions  Medication Sig Dispense Refill  . Calcium Carbonate-Vit D-Min 600-400 MG-UNIT TABS Take 1 tablet by mouth daily.        . carvedilol (COREG) 6.25 MG tablet Take 1 tablet (6.25 mg total) by mouth every 12 (twelve) hours.  30 tablet  6  . digoxin (LANOXIN) 0.25 MG tablet Take 1 tablet (250 mcg total) by mouth daily.  30 tablet  11  . furosemide (LASIX) 40 MG tablet TAKE ONE TABLET BY MOUTH TWICE DAILY  60 tablet  5  . loratadine (CLARITIN) 10 MG tablet Take 10 mg by mouth as needed.       . Multiple Vitamin (MULTIVITAMIN) tablet Take 1 tablet by mouth daily.        . potassium chloride SA (K-DUR,KLOR-CON) 20 MEQ tablet Take 2 tablets (40 mEq total) by mouth 2 (two) times daily.  120 tablet  5  . enalapril (VASOTEC) 5 MG tablet Take 1 tablet (5 mg total) by mouth daily.  90 tablet  3   No current facility-administered medications for this visit.    Past Medical History  Diagnosis Date  . Hypertension   . GERD (gastroesophageal reflux disease)   . Seasonal allergies   . Nonischemic cardiomyopathy     LHC 01/2009: Normal coronary arteries, EF 15%. Last echo 09/2010: EF 35-40%, grade 2 diastolic dysfunction, trivial MR, mild LAE.  Marland Kitchen Diverticulosis     Past Surgical History  Procedure Laterality Date  . Tubal ligation      S/P    Family History  Problem Relation Age of Onset  .  Coronary artery disease Father   . Hypertension Other   . Diabetes Mother   . Colon cancer Father 59  . Stomach cancer Neg Hx     History   Social History  . Marital Status: Single    Spouse Name: N/A    Number of Children: 1  . Years of Education: N/A   Occupational History  . 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  [employer]   Social History Main Topics  . Smoking status: Former Smoker -- 2 years    Types: Cigarettes    Quit date: 04/01/1988  . Smokeless tobacco: Never Used  . Alcohol Use: Yes     Comment: social  . Drug Use: No  . Sexually Active: Not on file   Other Topics Concern  . Not on file   Social History Narrative   1 child    Review of Systems:  All systems reviewed.  They are negative to the above problem except as previously stated.  Vital Signs: BP 120/72  Pulse 64  Ht 5\' 3"  (1.6 m)  Wt 191 lb (86.637 kg)  BMI 33.84 kg/m2  Physical Exam Patient is in NAD HEENT:  Normocephalic, atraumatic. EOMI, PERRLA.  Neck: JVP is normal.  No bruits.  Lungs: clear to auscultation. No rales no wheezes.  Heart: Regular rate and rhythm. Normal S1, S2. No S3.   No significant murmurs. PMI not displaced.  Abdomen:  Supple, nontender. Normal bowel sounds. No masses. No hepatomegaly.  Extremities:   Good distal pulses throughout. No lower extremity edema.  Musculoskeletal :moving all extremities.  Neuro:   alert and oriented x3.  CN II-XII grossly intact.  EKG:  SB   Nonspecifec ST T wave  Assessment and Plan:  1.  NICM  Volume status continues to look good  Will check BMET  Will also reassess LVEF with echo Keep on same meds for now  Consider adding aldactone.  Patient is reluctant to add another meds  WIll look into exercise programs.  2.  HTN  Continue meds.  F/U in clinic tentatively in 6 months.

## 2012-06-29 NOTE — Patient Instructions (Addendum)
Your physician has requested that you have an echocardiogram. Echocardiography is a painless test that uses sound waves to create images of your heart. It provides your doctor with information about the size and shape of your heart and how well your heart's chambers and valves are working. This procedure takes approximately one hour. There are no restrictions for this procedure.  LABS TODAY:  BMET

## 2012-07-06 ENCOUNTER — Ambulatory Visit (HOSPITAL_COMMUNITY): Payer: BC Managed Care – PPO | Attending: Cardiovascular Disease | Admitting: Radiology

## 2012-07-06 DIAGNOSIS — I5022 Chronic systolic (congestive) heart failure: Secondary | ICD-10-CM

## 2012-07-06 DIAGNOSIS — I10 Essential (primary) hypertension: Secondary | ICD-10-CM

## 2012-07-06 DIAGNOSIS — I509 Heart failure, unspecified: Secondary | ICD-10-CM

## 2012-07-06 NOTE — Progress Notes (Signed)
Echocardiogram performed.  

## 2012-07-14 ENCOUNTER — Other Ambulatory Visit: Payer: Self-pay | Admitting: *Deleted

## 2012-07-14 DIAGNOSIS — I5022 Chronic systolic (congestive) heart failure: Secondary | ICD-10-CM

## 2012-07-14 MED ORDER — SPIRONOLACTONE 25 MG PO TABS: 12.5000 mg | ORAL_TABLET | Freq: Every day | ORAL | Status: DC

## 2012-07-14 MED ORDER — CARVEDILOL 6.25 MG PO TABS: 12.5000 mg | ORAL_TABLET | Freq: Two times a day (BID) | ORAL | Status: DC

## 2012-07-21 ENCOUNTER — Telehealth: Payer: Self-pay | Admitting: Internal Medicine

## 2012-07-21 NOTE — Telephone Encounter (Signed)
Follow up  ° ° ° °Returning call back to nurse  °

## 2012-07-21 NOTE — Telephone Encounter (Signed)
Should be taking Aldactone 12.5mg  daily and have BMET on 4/23 or 4/24.

## 2012-07-21 NOTE — Telephone Encounter (Signed)
New problem   Pt states she spoke to someone earlier but wasn't for sure if it was in this office because she couldn't remember who it was.  Pt thinks she's taken too much Spironolactone and is not feeling well/thinks she was to take a 1/2 a pill but she's been taking a whole pill

## 2012-07-21 NOTE — Telephone Encounter (Signed)
New Prob     Pt states she was put on a new medication (SPIRONOLACTONE). States she has been taking the medication incorrection and would like to speak to nurse regarding this.

## 2012-07-22 NOTE — Telephone Encounter (Signed)
Left message for pt to call back  °

## 2012-07-27 ENCOUNTER — Other Ambulatory Visit: Payer: BC Managed Care – PPO

## 2012-07-31 ENCOUNTER — Other Ambulatory Visit (INDEPENDENT_AMBULATORY_CARE_PROVIDER_SITE_OTHER): Payer: BC Managed Care – PPO

## 2012-07-31 DIAGNOSIS — I5022 Chronic systolic (congestive) heart failure: Secondary | ICD-10-CM

## 2012-08-03 LAB — BASIC METABOLIC PANEL
Calcium: 8.8 mg/dL (ref 8.4–10.5)
Chloride: 106 mEq/L (ref 96–112)
Creatinine, Ser: 0.9 mg/dL (ref 0.4–1.2)
GFR: 90.74 mL/min (ref >60.00)

## 2012-08-12 ENCOUNTER — Ambulatory Visit (INDEPENDENT_AMBULATORY_CARE_PROVIDER_SITE_OTHER): Payer: BC Managed Care – PPO | Admitting: Internal Medicine

## 2012-08-12 VITALS — BP 120/71 | HR 70 | Ht 63.0 in | Wt 188.0 lb

## 2012-08-12 DIAGNOSIS — I428 Other cardiomyopathies: Secondary | ICD-10-CM

## 2012-08-12 DIAGNOSIS — I10 Essential (primary) hypertension: Secondary | ICD-10-CM

## 2012-08-12 DIAGNOSIS — I5022 Chronic systolic (congestive) heart failure: Secondary | ICD-10-CM

## 2012-08-12 MED ORDER — ISOSORBIDE MONONITRATE ER 30 MG PO TB24: 30.0000 mg | ORAL_TABLET | Freq: Every day | ORAL | Status: DC

## 2012-08-12 MED ORDER — HYDRALAZINE HCL 25 MG PO TABS: 25.0000 mg | ORAL_TABLET | Freq: Three times a day (TID) | ORAL | Status: DC

## 2012-08-12 NOTE — Assessment & Plan Note (Addendum)
As above She is euvolemic.  Last age level was 1.9. I was a year ago we will recheck it today.

## 2012-08-12 NOTE — Assessment & Plan Note (Signed)
The patient has nonischemic cardiomyopathy and class II congestive heart failure. she has been on beta blockers and ACE inhibitors for some time. A few weeks ago she was started on sprironolactone but has been concerned and she has developed some unusual generalized sensations as well some numbness in her toes.  She comes in today of averse to the  [employer] of an ICD. We spoke about this a great deal. We also talked about the importance of medications further reduction risk for cardiac arrest and congestive heart failure as well as the potential to improve left ventricular function. In this regard, which she would like to do is to stop her spironolactone anwe will use hydralazine nitrates because of its beneficial effect in African Americans. We will reassess her left ventricular function 3 months time and at that point if it is not improved significantly, she will strongly consider ICD implantation

## 2012-08-12 NOTE — Progress Notes (Signed)
A year.[email or web address] .ckc   ELECTROPHYSIOLOGY CONSULT NOTE  Patient ID: Katrina Carrillo, MRN: [id number], DOB/AGE: [birth year] 47 y.o. Admit date: (Not on file) Date of Consult: 08/12/2012  Primary Physician: No primary provider on file. Primary Cardiologist: PR  Chief Complaint: ICD    HPI Katrina Carrillo is a 47 y.o. female  Referred for consideration of an ICD.  She has a history of nonischemic heart disease with persistent left ventricular dysfunction despite guidelines directed medical therapy. Catheterization 2010 ejection fraction was 15%. Echo 2012 EF is 35-40% a repeat echo 4/14 had an EF of 30%. QRS is narrow.  Functionally she is quite stable currently. She is short of breath with one flight of stairs  No orthopnea or PND or edema No syncope or palpitations.       Past Medical History  Diagnosis Date  . Hypertension   . GERD (gastroesophageal reflux disease)   . Seasonal allergies   . Nonischemic cardiomyopathy     LHC 01/2009: Normal coronary arteries, EF 15%. Last echo 09/2010: EF 35-40%, grade 2 diastolic dysfunction, trivial MR, mild LAE.  Marland Kitchen Diverticulosis       Surgical History:  Past Surgical History  Procedure Laterality Date  . Tubal ligation      S/P     Home Meds: Prior to Admission medications   Medication Sig Start Date End Date Taking? Authorizing Provider  Calcium Carbonate-Vit D-Min 600-400 MG-UNIT TABS Take 1 tablet by mouth daily.      Historical Provider, MD  carvedilol (COREG) 6.25 MG tablet Take 2 tablets (12.5 mg total) by mouth every 12 (twelve) hours. 07/14/12   Pricilla Riffle, MD  digoxin (LANOXIN) 0.25 MG tablet Take 1 tablet (250 mcg total) by mouth daily. 06/29/12   Pricilla Riffle, MD  enalapril (VASOTEC) 5 MG tablet Take 1 tablet (5 mg total) by mouth 2 (two) times daily. 06/29/12   Pricilla Riffle, MD  furosemide (LASIX) 40 MG tablet Take 1 tablet (40 mg total) by mouth 2 (two) times daily. 06/29/12   Pricilla Riffle, MD  loratadine  (CLARITIN) 10 MG tablet Take 10 mg by mouth as needed.     Historical Provider, MD  Multiple Vitamin (MULTIVITAMIN) tablet Take 1 tablet by mouth daily.      Historical Provider, MD  potassium chloride SA (K-DUR,KLOR-CON) 20 MEQ tablet Take 2 tablets (40 mEq total) by mouth 2 (two) times daily. 06/29/12   Pricilla Riffle, MD  spironolactone (ALDACTONE) 25 MG tablet Take 0.5 tablets (12.5 mg total) by mouth daily. 07/14/12   Pricilla Riffle, MD     Allergies:  Allergies  Allergen Reactions  . Peanut-Containing Drug Products Anaphylaxis  . Shellfish Allergy Anaphylaxis  . Iodine Swelling and Rash    History   Social History  . Marital Status: Single    Spouse Name: N/A    Number of Children: 1  . Years of Education: N/A   Occupational History  . 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  [employer]   Social History Main Topics  . Smoking status: Former Smoker -- 2 years    Types: Cigarettes    Quit date: 04/01/1988  . Smokeless tobacco: Never Used  . Alcohol Use: Yes     Comment: social  . Drug Use: No  . Sexually Active: Not on file   Other Topics Concern  . Not on file   Social History  Narrative   1 child     Family History  Problem Relation Age of Onset  . Coronary artery disease Father   . Hypertension Other   . Diabetes Mother   . Colon cancer Father 39  . Stomach cancer Neg Hx      ROS:  Please see the history of present illness.     All other systems reviewed and negative.    Physical Exam: BP 120/71  Pulse 70  Ht 5\' 3"  (1.6 m)  Wt 188 lb (85.276 kg)  BMI 33.31 kg/m2 General: Well developed, well nourished female in no acute distress. Head: Normocephalic, atraumatic, sclera non-icteric, no xanthomas, nares are without discharge. EENT: normal Lymph Nodes:  none Back: without scoliosis/kyphosis, no CVA tendersness Neck: Negative for carotid bruits. JVD not elevated. Lungs: Clear bilaterally to auscultation without wheezes, rales, or rhonchi.  Breathing is unlabored. Heart: RRR with S1 S2. 1/6 systolic murmur , rubs, or gallops appreciated. Abdomen: Soft, non-tender, non-distended with normoactive bowel sounds. No hepatomegaly. No rebound/guarding. No obvious abdominal masses. Msk:  Strength and tone appear normal for age. Extremities: No clubbing or cyanosis. No edema.  Distal pedal pulses are 2+ and equal bilaterally. Skin: Warm and Dry Neuro: Alert and oriented X 3. CN III-XII intact Grossly normal sensory and motor function . Psych:  Responds to questions appropriately with a normal affect.      Labs: Cardiac Enzymes No results found for this basename: CKTOTAL, CKMB, TROPONINI,  in the last 72 hours CBC Lab Results  Component Value Date   WBC 13.6* 03/28/2011   HGB 12.6 03/28/2011   HCT 38.1 03/28/2011   MCV 80.9 03/28/2011   PLT 261.0 03/28/2011   PROTIME: No results found for this basename: LABPROT, INR,  in the last 72 hours Chemistry No results found for this basename: NA, K, CL, CO2, BUN, CREATININE, CALCIUM, LABALBU, PROT, BILITOT, ALKPHOS, ALT, AST, GLUCOSE,  in the last 168 hours Lipids Lab Results  Component Value Date   CHOL  Value: 134        ATP III CLASSIFICATION:  <200     mg/dL   Desirable  161-096  mg/dL   Borderline High  >=045    mg/dL   High        40/98/1191   HDL 28* 02/17/2009   LDLCALC  Value: 84        Total Cholesterol/HDL:CHD Risk Coronary Heart Disease Risk Table                     Men   Women  1/2 Average Risk   3.4   3.3  Average Risk       5.0   4.4  2 X Average Risk   9.6   7.1  3 X Average Risk  23.4   11.0        Use the calculated Patient Ratio above and the CHD Risk Table to determine the patient's CHD Risk.        ATP III CLASSIFICATION (LDL):  <100     mg/dL   Optimal  478-295  mg/dL   Near or Above                    Optimal  130-159  mg/dL   Borderline  621-308  mg/dL   High  >657     mg/dL   Very High 84/69/6295   TRIG 111 02/17/2009   BNP Pro B Natriuretic peptide (BNP)  Date/Time Value Range Status  05/19/2009 10:04 AM 39.0  0.0-100.0 pg/mL Final  02/19/2009  5:35 AM 130.0* 0.0 - 100.0 pg/mL Final  02/17/2009  3:50 AM 582.0* 0.0 - 100.0 pg/mL Final  02/15/2009  5:25 PM 591.0* 0.0 - 100.0 pg/mL Final   Miscellaneous Lab Results  Component Value Date   DDIMER  Value: 0.91        AT THE INHOUSE ESTABLISHED CUTOFF VALUE OF 0.48 ug/mL FEU, THIS ASSAY HAS BEEN DOCUMENTED IN THE LITERATURE TO HAVE A SENSITIVITY AND NEGATIVE PREDICTIVE VALUE OF AT LEAST 98 TO 99%.  THE TEST RESULT SHOULD BE CORRELATED WITH AN ASSESSMENT OF THE CLINICAL PROBABILITY OF DVT / VTE.* 02/03/2009    Radiology/Studies:  No results found.  EKG: sinus with narrow QRs   Assessment and Plan:    Katrina Carrillo   i

## 2012-08-12 NOTE — Assessment & Plan Note (Signed)
Modestly elevated

## 2012-08-12 NOTE — Patient Instructions (Addendum)
Your physician recommends that you schedule a follow-up appointment in: 3 months with Dr Graciela Husbands  Your physician has requested that you have an echocardiogram. Echocardiography is a painless test that uses sound waves to create images of your heart. It provides your doctor with information about the size and shape of your heart and how well your heart's chambers and valves are working. This procedure takes approximately one hour. There are no restrictions for this procedure.--please have this done a week prior to her appointment in 3 months   Your physician has recommended you make the following change in your medication:  1) Stop Spironolactone 2) Start Hydralazine 25mg  twice daily 3) Start Imdur 30mg  daily    Your physician recommends that you return for lab work in: BMP/Dig level

## 2012-08-13 ENCOUNTER — Other Ambulatory Visit: Payer: BC Managed Care – PPO

## 2012-08-13 LAB — BASIC METABOLIC PANEL
GFR: 111.38 mL/min (ref >60.00)
Glucose, Bld: 91 mg/dL (ref 70–99)
Potassium: 4.5 mEq/L (ref 3.5–5.1)
Sodium: 139 mEq/L (ref 135–145)

## 2012-08-14 LAB — DIGOXIN LEVEL: Digoxin Level: 1.3 ng/mL (ref 0.8–2.0)

## 2012-08-25 ENCOUNTER — Telehealth: Payer: Self-pay | Admitting: *Deleted

## 2012-08-25 DIAGNOSIS — I5022 Chronic systolic (congestive) heart failure: Secondary | ICD-10-CM

## 2012-08-25 MED ORDER — DIGOXIN 125 MCG PO TABS: 0.1250 mg | ORAL_TABLET | Freq: Every day | ORAL | Status: DC

## 2012-08-25 NOTE — Telephone Encounter (Signed)
Please inform that her dig level is too high Have her cut her dose in half thanks  I spoke with the patient and she is aware of her results. She will decrease digoxin to 0.125 mg once daily. She would like a new RX sent to the pharmacy. Sherri Rad, RN, BSN

## 2012-11-11 ENCOUNTER — Other Ambulatory Visit (HOSPITAL_COMMUNITY): Payer: Self-pay | Admitting: Internal Medicine

## 2012-11-11 DIAGNOSIS — I5020 Unspecified systolic (congestive) heart failure: Secondary | ICD-10-CM

## 2012-11-12 ENCOUNTER — Ambulatory Visit (HOSPITAL_COMMUNITY): Payer: BC Managed Care – PPO | Attending: Internal Medicine | Admitting: Radiology

## 2012-11-12 DIAGNOSIS — I428 Other cardiomyopathies: Secondary | ICD-10-CM | POA: Insufficient documentation

## 2012-11-12 DIAGNOSIS — Z87891 Personal history of nicotine dependence: Secondary | ICD-10-CM | POA: Insufficient documentation

## 2012-11-12 DIAGNOSIS — I5020 Unspecified systolic (congestive) heart failure: Secondary | ICD-10-CM

## 2012-11-12 DIAGNOSIS — I5022 Chronic systolic (congestive) heart failure: Secondary | ICD-10-CM | POA: Insufficient documentation

## 2012-11-12 NOTE — Progress Notes (Signed)
Echocardiogram performed.  

## 2012-11-19 ENCOUNTER — Ambulatory Visit (INDEPENDENT_AMBULATORY_CARE_PROVIDER_SITE_OTHER): Payer: BC Managed Care – PPO | Admitting: Internal Medicine

## 2012-11-19 ENCOUNTER — Encounter: Payer: Self-pay | Admitting: Internal Medicine

## 2012-11-19 VITALS — BP 102/68 | HR 67 | Ht 63.0 in | Wt 190.2 lb

## 2012-11-19 DIAGNOSIS — I5022 Chronic systolic (congestive) heart failure: Secondary | ICD-10-CM

## 2012-11-19 DIAGNOSIS — I428 Other cardiomyopathies: Secondary | ICD-10-CM

## 2012-11-19 NOTE — Assessment & Plan Note (Signed)
Stable class 2 symptoms

## 2012-11-19 NOTE — Assessment & Plan Note (Signed)
There has been significant interval improvement in her ejection fraction. Her ejection fraction has exceeded 40%. We will defer any further ICD discussions to a future time. She is thrilled; so am i

## 2012-11-19 NOTE — Patient Instructions (Signed)
Your physician recommends that you schedule a follow-up appointment in: 3-4 months with Dr. Tenny Craw.  Your physician recommends that you continue on your current medications as directed. Please refer to the Current Medication list given to you today.

## 2012-11-19 NOTE — Progress Notes (Signed)
f Patient has no care team.   HPI  Katrina Carrillo is a 47 y.o. female Seen in followup for discussion regarding possible ICD. She is a nonischemic cardiomyopathy and had been on beta blockers and ACE inhibitors. She had been tried on Aldactone with some difficulty; at her last visit we switched her to hydralazine nitrates. Repeat echocardiogram and showed interval improvement in ejection fraction to 40-45%  She has been walking more. She is able to do some inclines with only mild shortness of breath. This is a vast improvement. She has no peripheral edema.  Her dig level following her last visit was 1.3; the dose was reduced from 0.25--0 1.25  Past Medical History  Diagnosis Date  . Hypertension   . GERD (gastroesophageal reflux disease)   . Seasonal allergies   . Nonischemic cardiomyopathy     LHC 01/2009: Normal coronary arteries, EF 15%. Last echo 09/2010: EF 35-40%, grade 2 diastolic dysfunction, trivial MR, mild LAE.  Marland Kitchen Diverticulosis     Past Surgical History  Procedure Laterality Date  . Tubal ligation      S/P    Current Outpatient Prescriptions  Medication Sig Dispense Refill  . carvedilol (COREG) 6.25 MG tablet Take 2 tablets (12.5 mg total) by mouth every 12 (twelve) hours.  360 tablet  3  . digoxin (LANOXIN) 0.125 MG tablet Take 1 tablet (0.125 mg total) by mouth daily.  30 tablet  11  . enalapril (VASOTEC) 5 MG tablet Take 1 tablet (5 mg total) by mouth 2 (two) times daily.  180 tablet  3  . furosemide (LASIX) 40 MG tablet Take 1 tablet (40 mg total) by mouth 2 (two) times daily.  180 tablet  3  . hydrALAZINE (APRESOLINE) 25 MG tablet Take 1 tablet (25 mg total) by mouth 3 (three) times daily.  180 tablet  3  . isosorbide mononitrate (IMDUR) 30 MG 24 hr tablet Take 1 tablet (30 mg total) by mouth daily.  90 tablet  3  . loratadine (CLARITIN) 10 MG tablet Take 10 mg by mouth as needed.       . Multiple Vitamin (MULTIVITAMIN) tablet Take 1 tablet by mouth  daily.        . potassium chloride SA (K-DUR,KLOR-CON) 20 MEQ tablet Take 2 tablets (40 mEq total) by mouth 2 (two) times daily.  180 tablet  3   No current facility-administered medications for this visit.    Allergies  Allergen Reactions  . Peanut-Containing Drug Products Anaphylaxis  . Shellfish Allergy Anaphylaxis  . Iodine Swelling and Rash    Review of Systems negative except from HPI and PMH  Physical Exam BP 102/68  Pulse 67  Ht 5\' 3"  (1.6 m)  Wt 190 lb 3.2 oz (86.274 kg)  BMI 33.7 kg/m2 Well developed and well nourished in no acute distress HENT normal E scleral and icterus clear Neck Supple JVP flat; carotids brisk and full Clear to ausculation  Regular rate and rhythm, no murmurs gallops or rub Soft with active bowel sounds No clubbing cyanosis none Edema Alert and oriented, grossly normal motor and sensory function Skin Warm and Dry  ECG demonstrates sinus rhythm at 57 Intervals 19/08/38 Nonspecific ST-T changes   Assessment and  Plan

## 2012-12-29 ENCOUNTER — Other Ambulatory Visit: Payer: Self-pay

## 2012-12-29 DIAGNOSIS — Z1231 Encounter for screening mammogram for malignant neoplasm of breast: Secondary | ICD-10-CM

## 2013-01-18 ENCOUNTER — Ambulatory Visit
Admission: RE | Admit: 2013-01-18 | Discharge: 2013-01-18 | Disposition: A | Discharge status: Home / Self Care | Payer: BC Managed Care – PPO | Source: Ambulatory Visit

## 2013-01-18 DIAGNOSIS — Z1231 Encounter for screening mammogram for malignant neoplasm of breast: Secondary | ICD-10-CM

## 2013-02-11 ENCOUNTER — Telehealth: Payer: Self-pay | Admitting: Internal Medicine

## 2013-02-11 NOTE — Telephone Encounter (Signed)
New message     Pt had a pain in her chest area earlier today---She carried a box with a crockpot in it and it contained food--Don't know if it was a muscle pull.  Having no other symptoms but wanted to talk to a nurse about it.

## 2013-02-11 NOTE — Telephone Encounter (Signed)
Spoke with pt, she is having a ache pain in her collar bone area on both sides. She can reproduce the discomfort with movement and leaning forward. Reassurance given to pt, does not sound like heart pain. She will try ibuprofen to help with the discomfort. Patient voiced understanding

## 2013-03-04 ENCOUNTER — Ambulatory Visit (INDEPENDENT_AMBULATORY_CARE_PROVIDER_SITE_OTHER): Payer: BC Managed Care – PPO | Admitting: Internal Medicine

## 2013-03-04 ENCOUNTER — Encounter: Payer: Self-pay | Admitting: Internal Medicine

## 2013-03-04 VITALS — BP 122/73 | HR 76 | Ht 63.0 in | Wt 196.0 lb

## 2013-03-04 DIAGNOSIS — E785 Hyperlipidemia, unspecified: Secondary | ICD-10-CM

## 2013-03-04 DIAGNOSIS — I5022 Chronic systolic (congestive) heart failure: Secondary | ICD-10-CM

## 2013-03-04 DIAGNOSIS — Z5181 Encounter for therapeutic drug level monitoring: Secondary | ICD-10-CM

## 2013-03-04 DIAGNOSIS — Z79899 Other long term (current) drug therapy: Secondary | ICD-10-CM

## 2013-03-04 LAB — CBC WITH DIFFERENTIAL/PLATELET
Basophils Relative: 0.8 % (ref 0.0–3.0)
Eosinophils Absolute: 0.1 10*3/uL (ref 0.0–0.7)
Eosinophils Relative: 1.5 % (ref 0.0–5.0)
HCT: 37.9 % (ref 36.0–46.0)
Hemoglobin: 12.6 g/dL (ref 12.0–15.0)
Lymphs Abs: 1.3 10*3/uL (ref 0.7–4.0)
MCHC: 33.2 g/dL (ref 30.0–36.0)
MCV: 80.4 fl (ref 78.0–100.0)
Monocytes Absolute: 0.4 10*3/uL (ref 0.1–1.0)
Neutro Abs: 5.3 10*3/uL (ref 1.4–7.7)
Neutrophils Relative %: 74 % (ref 43.0–77.0)
RBC: 4.72 Mil/uL (ref 3.87–5.11)
WBC: 7.1 10*3/uL (ref 4.5–10.5)

## 2013-03-04 LAB — BASIC METABOLIC PANEL
CO2: 29 mEq/L (ref 19–32)
Chloride: 104 mEq/L (ref 96–112)
Creatinine, Ser: 0.7 mg/dL (ref 0.4–1.2)
Potassium: 3.8 mEq/L (ref 3.5–5.1)
Sodium: 140 mEq/L (ref 135–145)

## 2013-03-04 LAB — LDL CHOLESTEROL, DIRECT: Direct LDL: 147.2 mg/dL

## 2013-03-04 LAB — LIPID PANEL
Total CHOL/HDL Ratio: 4
Triglycerides: 77 mg/dL (ref 0.0–149.0)

## 2013-03-04 NOTE — Patient Instructions (Signed)
Your physician recommends that you continue on your current medications as directed. Please refer to the Current Medication list given to you today.  Your physician recommends that you return for lab work in: Today  Your physician wants you to follow-up in: 6 months. You will receive a reminder letter in the mail two months in advance. If you don't receive a letter, please call our office to schedule the follow-up appointment.

## 2013-03-04 NOTE — Progress Notes (Signed)
HPI Patient is a 47 yo with  a hx of NICM, HTN, GERD. LHC 01/2009: Normal coronary arteries, EF 15%. Echo 09/2010:  I saw her in clinic last May  She was seen by Odessa Fleming over the summer.  Echo was done   LVEF 40 to 45%  I have reviewed and I agree that LV function looks a little better than previous Her medcines were adjusted She says she is breathing OK  No CP  She admits to eating more than she should and also to not exercising as much    Allergies  Allergen Reactions  . Peanut-Containing Drug Products Anaphylaxis  . Shellfish Allergy Anaphylaxis  . Iodine Swelling and Rash    Current Outpatient Prescriptions  Medication Sig Dispense Refill  . carvedilol (COREG) 6.25 MG tablet Take 2 tablets (12.5 mg total) by mouth every 12 (twelve) hours.  360 tablet  3  . digoxin (LANOXIN) 0.125 MG tablet Take 1 tablet (0.125 mg total) by mouth daily.  30 tablet  11  . enalapril (VASOTEC) 5 MG tablet Take 1 tablet (5 mg total) by mouth 2 (two) times daily.  180 tablet  3  . furosemide (LASIX) 40 MG tablet Take 1 tablet (40 mg total) by mouth 2 (two) times daily.  180 tablet  3  . hydrALAZINE (APRESOLINE) 25 MG tablet Take 25 mg by mouth 2 (two) times daily.      . isosorbide mononitrate (IMDUR) 30 MG 24 hr tablet Take 1 tablet (30 mg total) by mouth daily.  90 tablet  3  . loratadine (CLARITIN) 10 MG tablet Take 10 mg by mouth as needed.       . Multiple Vitamin (MULTIVITAMIN) tablet Take 1 tablet by mouth daily.        . potassium chloride SA (K-DUR,KLOR-CON) 20 MEQ tablet Take 2 tablets (40 mEq total) by mouth 2 (two) times daily.  180 tablet  3   No current facility-administered medications for this visit.    Past Medical History  Diagnosis Date  . Hypertension   . GERD (gastroesophageal reflux disease)   . Seasonal allergies   . Nonischemic cardiomyopathy     LHC 01/2009: Normal coronary arteries, EF 15%. Last echo 09/2010: EF 35-40%, grade 2 diastolic dysfunction, trivial MR, mild LAE.8/14  EF 40-45%   . Diverticulosis     Past Surgical History  Procedure Laterality Date  . Tubal ligation      S/P    Family History  Problem Relation Age of Onset  . Coronary artery disease Father   . Hypertension Other   . Diabetes Mother   . Colon cancer Father 2  . Stomach cancer Neg Hx     History   Social History  . Marital Status: Single    Spouse Name: N/A    Number of Children: 1  . Years of Education: N/A   Occupational History  . 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  [employer]   Social History Main Topics  . Smoking status: Former Smoker -- 2 years    Types: Cigarettes    Quit date: 04/01/1988  . Smokeless tobacco: Never Used  . Alcohol Use: Yes     Comment: social  . Drug Use: No  . Sexual Activity: Not on file   Other Topics Concern  . Not on file   Social History Narrative   1 child    Review of Systems:  All  systems reviewed.  They are negative to the above problem except as previously stated.  Vital Signs: BP 122/73  Pulse 76  Ht 5\' 3"  (1.6 m)  Wt 196 lb (88.905 kg)  BMI 34.73 kg/m2  Physical Exam Patient is in NAD HEENT:  Normocephalic, atraumatic. EOMI, PERRLA.  Neck: JVP is normal.  No bruits.  Lungs: clear to auscultation. No rales no wheezes.  Heart: Regular rate and rhythm. Normal S1, S2. No S3.   No significant murmurs. PMI not displaced.  Abdomen:  Supple, nontender. Normal bowel sounds. No masses. No hepatomegaly.  Extremities:   Good distal pulses throughout. No lower extremity edema.  Musculoskeletal :moving all extremities.  Neuro:   alert and oriented x3.  CN II-XII grossly intact.  Assessment and Plan:  1.  NICM  Volume status continues to look good LVEF on echo in August is improved  I have reviewed images and agree She is tolerating current regimen  I would continue F/U in 6 months   Encouraged her to get back to exercising.     2.  HTN  Continue meds.  F/U in clinic tentatively in 6 months.

## 2013-03-05 LAB — DIGOXIN LEVEL: Digoxin Level: 0.8 ng/mL (ref 0.8–2.0)

## 2013-04-09 ENCOUNTER — Other Ambulatory Visit: Payer: Self-pay | Admitting: Internal Medicine

## 2013-07-16 ENCOUNTER — Other Ambulatory Visit: Payer: Self-pay | Admitting: Internal Medicine

## 2013-08-02 ENCOUNTER — Other Ambulatory Visit: Payer: Self-pay | Admitting: Internal Medicine

## 2013-08-19 ENCOUNTER — Other Ambulatory Visit: Payer: Self-pay | Admitting: *Deleted

## 2013-08-19 DIAGNOSIS — I5022 Chronic systolic (congestive) heart failure: Secondary | ICD-10-CM

## 2013-08-19 MED ORDER — ISOSORBIDE MONONITRATE ER 30 MG PO TB24: 30.0000 mg | ORAL_TABLET | Freq: Every day | ORAL | Status: DC

## 2013-08-19 MED ORDER — HYDRALAZINE HCL 25 MG PO TABS: 25.0000 mg | ORAL_TABLET | Freq: Two times a day (BID) | ORAL | Status: DC

## 2013-09-06 ENCOUNTER — Encounter: Payer: Self-pay | Admitting: Internal Medicine

## 2013-09-06 ENCOUNTER — Ambulatory Visit (INDEPENDENT_AMBULATORY_CARE_PROVIDER_SITE_OTHER): Payer: BC Managed Care – PPO | Admitting: Internal Medicine

## 2013-09-06 VITALS — BP 110/66 | HR 72 | Ht 63.0 in | Wt 189.0 lb

## 2013-09-06 DIAGNOSIS — I5022 Chronic systolic (congestive) heart failure: Secondary | ICD-10-CM

## 2013-09-06 DIAGNOSIS — I10 Essential (primary) hypertension: Secondary | ICD-10-CM

## 2013-09-06 DIAGNOSIS — I428 Other cardiomyopathies: Secondary | ICD-10-CM

## 2013-09-06 LAB — LIPID PANEL
CHOL/HDL RATIO: 3
Cholesterol: 189 mg/dL (ref 0–200)
HDL: 59.4 mg/dL (ref >39.00)
LDL CALC: 113 mg/dL — AB (ref 0–99)
NONHDL: 129.6
Triglycerides: 85 mg/dL (ref 0.0–149.0)
VLDL: 17 mg/dL (ref 0.0–40.0)

## 2013-09-06 LAB — BASIC METABOLIC PANEL
BUN: 13 mg/dL (ref 6–23)
CHLORIDE: 102 meq/L (ref 96–112)
CO2: 30 mEq/L (ref 19–32)
Calcium: 9.3 mg/dL (ref 8.4–10.5)
Creatinine, Ser: 0.7 mg/dL (ref 0.4–1.2)
GFR: 109.13 mL/min (ref >60.00)
GLUCOSE: 108 mg/dL — AB (ref 70–99)
POTASSIUM: 3.8 meq/L (ref 3.5–5.1)
SODIUM: 138 meq/L (ref 135–145)

## 2013-09-06 NOTE — Patient Instructions (Signed)
Your physician recommends that you continue on your current medications as directed. Please refer to the Current Medication list given to you today. Your physician recommends that you return for lab work in: [place] (BMET, LIPID)  Your physician wants you to follow-up in: JAN. 2016 [place].  You will receive a reminder letter in the mail two months in advance. If you don't receive a letter, please call our office to schedule the follow-up appointment.

## 2013-09-06 NOTE — Progress Notes (Signed)
HPI Patient is a 48 yo with  a hx of NICM, HTN, GERD. LHC 01/2009: Normal coronary arteries, EF 15%. Last  Echo was done   LVEF 40 to 45%   I saw hier back in Dec 2014 Since then, breathing has been OK  No edema.    Allergies  Allergen Reactions  . Peanut-Containing Drug Products Anaphylaxis  . Shellfish Allergy Anaphylaxis  . Iodine Swelling and Rash    Current Outpatient Prescriptions  Medication Sig Dispense Refill  . carvedilol (COREG) 6.25 MG tablet take 2 tablets by mouth every 12 hours  360 tablet  0  . digoxin (LANOXIN) 0.125 MG tablet Take 1 tablet (0.125 mg total) by mouth daily.  30 tablet  11  . enalapril (VASOTEC) 5 MG tablet take 1 tablet by mouth twice a day  180 tablet  0  . furosemide (LASIX) 40 MG tablet take 1 tablet by mouth twice a day  180 tablet  0  . hydrALAZINE (APRESOLINE) 25 MG tablet Take 1 tablet (25 mg total) by mouth 2 (two) times daily.  180 tablet  0  . isosorbide mononitrate (IMDUR) 30 MG 24 hr tablet Take 1 tablet (30 mg total) by mouth daily.  90 tablet  0  . loratadine (CLARITIN) 10 MG tablet Take 10 mg by mouth as needed.       . Multiple Vitamin (MULTIVITAMIN) tablet Take 1 tablet by mouth daily.        . potassium chloride SA (K-DUR,KLOR-CON) 20 MEQ tablet take 2 tablets by mouth twice a day  180 tablet  3   No current facility-administered medications for this visit.    Past Medical History  Diagnosis Date  . Hypertension   . GERD (gastroesophageal reflux disease)   . Seasonal allergies   . Nonischemic cardiomyopathy     LHC 01/2009: Normal coronary arteries, EF 15%. Last echo 09/2010: EF 95-28%, grade 2 diastolic dysfunction, trivial MR, mild LAE.8/14 EF 40-45%   . Diverticulosis     Past Surgical History  Procedure Laterality Date  . Tubal ligation      S/P    Family History  Problem Relation Age of Onset  . Coronary artery disease Father   . Hypertension Other   . Diabetes Mother   . Colon cancer Father 35  . Stomach cancer  Neg Hx     History   Social History  . Marital Status: Single    Spouse Name: N/A    Number of Children: 1  . Years of Education: N/A   Occupational History  . Unemployed     Trained as a Education officer, museum  .  [employer]   Social History Main Topics  . Smoking status: Former Smoker -- 2 years    Types: Cigarettes    Quit date: 04/01/1988  . Smokeless tobacco: Never Used  . Alcohol Use: Yes     Comment: social  . Drug Use: No  . Sexual Activity: Not on file   Other Topics Concern  . Not on file   Social History Narrative   1 child    Review of Systems:  All systems reviewed.  They are negative to the above problem except as previously stated.  Vital Signs: BP 110/66  Pulse 72  Ht 5\' 3"  (1.6 m)  Wt 189 lb (85.73 kg)  BMI 33.49 kg/m2  Physical Exam Patient is in NAD HEENT:  Normocephalic, atraumatic. EOMI, PERRLA.  Neck: JVP is normal.  No  bruits.  Lungs: clear to auscultation. No rales no wheezes.  Heart: Regular rate and rhythm. Normal S1, S2. No S3.   No significant murmurs. PMI not displaced.  Abdomen:  Supple, nontender. Normal bowel sounds. No masses. No hepatomegaly.  Extremities:   Good distal pulses throughout. No lower extremity edema.  Musculoskeletal :moving all extremities.  Neuro:   alert and oriented x3.  CN II-XII grossly intact. EKG  SR 72.   Assessment and Plan:  1.  NICM  Volume status looks good  Keep on same regimen. Check BMET 2.  HTN  Continue meds. Stay active. F/U in clinic tentatively in 6 months.

## 2013-09-14 ENCOUNTER — Telehealth: Payer: Self-pay | Admitting: Internal Medicine

## 2013-09-14 NOTE — Telephone Encounter (Signed)
New message ° ° ° ° ° °Returning a nurses call °

## 2013-09-14 NOTE — Telephone Encounter (Signed)
Patient made aware of lab results from last week.

## 2013-10-05 ENCOUNTER — Other Ambulatory Visit: Payer: Self-pay | Admitting: *Deleted

## 2013-10-05 DIAGNOSIS — I5022 Chronic systolic (congestive) heart failure: Secondary | ICD-10-CM

## 2013-10-05 MED ORDER — DIGOXIN 125 MCG PO TABS: 0.1250 mg | ORAL_TABLET | Freq: Every day | ORAL | Status: DC

## 2013-11-19 ENCOUNTER — Ambulatory Visit (INDEPENDENT_AMBULATORY_CARE_PROVIDER_SITE_OTHER): Payer: BC Managed Care – PPO | Admitting: Physician Assistant

## 2013-11-19 VITALS — BP 108/72 | HR 72 | Temp 97.8°F | Resp 18 | Ht 63.0 in | Wt 196.0 lb

## 2013-11-19 DIAGNOSIS — B9789 Other viral agents as the cause of diseases classified elsewhere: Principal | ICD-10-CM

## 2013-11-19 DIAGNOSIS — J069 Acute upper respiratory infection, unspecified: Secondary | ICD-10-CM

## 2013-11-19 MED ORDER — IPRATROPIUM BROMIDE 0.03 % NA SOLN: 2.0000 | Freq: Two times a day (BID) | NASAL | Status: DC

## 2013-11-19 MED ORDER — GUAIFENESIN ER 1200 MG PO TB12: 1.0000 | ORAL_TABLET | Freq: Two times a day (BID) | ORAL | Status: DC | PRN

## 2013-11-19 MED ORDER — BENZONATATE 100 MG PO CAPS: 100.0000 mg | ORAL_CAPSULE | Freq: Three times a day (TID) | ORAL | Status: DC | PRN

## 2013-11-19 NOTE — Patient Instructions (Signed)
Get plenty of rest and drink at least 64 ounces of water daily. 

## 2013-11-19 NOTE — Progress Notes (Signed)
Subjective:    Patient ID: Katrina Carrillo, female    DOB: 04/16/1965, 48 y.o.   MRN: [id number]   PCP: No PCP Per Patient  Chief Complaint  Patient presents with  . Cough    since monday -dry     Medications, allergies, past medical history, surgical history, family history, social history and problem list reviewed and updated.  Patient Active Problem List   Diagnosis Date Noted  . Nonischemic cardiomyopathy 08/12/2012  . Family history of malignant neoplasm of gastrointestinal tract 02/17/2012  . 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 CHRONIC SYSTOLIC HEART FAILURE 60/63/0160  . HYPERTENSION 02/22/2009  . GASTROESOPHAGEAL REFLUX DISEASE 02/22/2009  . LIVER HEMANGIOMA 02/15/2009    Prior to Admission medications   Medication Sig Start Date End Date Taking? Authorizing Provider  carvedilol (COREG) 6.25 MG tablet take 2 tablets by mouth every 12 hours   Yes Fay Records, MD  cetirizine (ZYRTEC) 10 MG tablet Take 10 mg by mouth daily.   Yes Historical Provider, MD  digoxin (LANOXIN) 0.125 MG tablet Take 1 tablet (0.125 mg total) by mouth daily. 10/05/13  Yes Fay Records, MD  enalapril (VASOTEC) 5 MG tablet take 1 tablet by mouth twice a day 07/16/13  Yes Fay Records, MD  furosemide (LASIX) 40 MG tablet take 1 tablet by mouth twice a day 07/16/13  Yes Fay Records, MD  hydrALAZINE (APRESOLINE) 25 MG tablet Take 1 tablet (25 mg total) by mouth 2 (two) times daily. 08/19/13  Yes Fay Records, MD  isosorbide mononitrate (IMDUR) 30 MG 24 hr tablet Take 1 tablet (30 mg total) by mouth daily. 08/19/13  Yes Fay Records, MD  Multiple Vitamin (MULTIVITAMIN) tablet Take 1 tablet by mouth daily.     Yes Historical Provider, MD  potassium chloride SA (K-DUR,KLOR-CON) 20 MEQ tablet take 2 tablets by mouth twice a day 04/09/13  Yes Fay Records, MD    HPI  Cough that began 11/15/2013. Non-productive. Initially had a sore throat and subjective fever/chills. Thought she might be having an allergy  attack, as she had ear fullness bilaterally and some nasal congestion. Concerned that she has a history of CHF. No CP, SOB. No LE edema. No HA.  Review of Systems As above.    Objective:   Physical Exam  Constitutional: She is oriented to person, place, and time. She appears well-developed and well-nourished. She is active and cooperative.  BP 108/72  Pulse 72  Temp(Src) 97.8 F (36.6 C) (Oral)  Resp 18  Ht 5\' 3"  (1.6 m)  Wt 196 lb (88.905 kg)  BMI 34.73 kg/m2  SpO2 95%   HENT:  Head: Normocephalic and atraumatic.  Right Ear: External ear normal.  Left Ear: External ear normal.  Nose: Nose normal.  Mouth/Throat: Oropharynx is clear and moist. No oropharyngeal exudate.  Eyes: Conjunctivae and EOM are normal. Pupils are equal, round, and reactive to light. Right eye exhibits no discharge. Left eye exhibits no discharge. No scleral icterus.  Neck: Normal range of motion. Neck supple. No thyromegaly present.  Cardiovascular: Normal rate, regular rhythm and intact distal pulses.   Murmur (heard best in the aortic space.) heard. No LE Edema.  Pulmonary/Chest: Effort normal and breath sounds normal.  Lymphadenopathy:    She has no cervical adenopathy.  Neurological: She is alert and oriented to person, place, and time.  Skin: Skin is warm and dry.  Psychiatric: She has a normal mood and affect. Her speech is normal  and behavior is normal.          Assessment & Plan:  1. Viral URI with cough Supportive care. Anticipatory guidance.  RTC if symptoms worsen/persist. - benzonatate (TESSALON) 100 MG capsule; Take 1-2 capsules (100-200 mg total) by mouth 3 (three) times daily as needed for cough.  Dispense: 40 capsule; Refill: 0 - Guaifenesin (MUCINEX MAXIMUM STRENGTH) 1200 MG TB12; Take 1 tablet (1,200 mg total) by mouth every 12 (twelve) hours as needed.  Dispense: 14 tablet; Refill: 1 - ipratropium (ATROVENT) 0.03 % nasal spray; Place 2 sprays into both nostrils 2 (two) times  daily.  Dispense: 30 mL; Refill: 0   Fara Chute, PA-C Physician Assistant-Certified [facility]

## 2013-11-20 ENCOUNTER — Other Ambulatory Visit: Payer: Self-pay | Admitting: Internal Medicine

## 2013-11-21 ENCOUNTER — Other Ambulatory Visit: Payer: Self-pay | Admitting: Internal Medicine

## 2013-11-23 ENCOUNTER — Encounter: Payer: BC Managed Care – PPO | Admitting: Physician Assistant

## 2013-11-23 ENCOUNTER — Other Ambulatory Visit: Payer: Self-pay

## 2013-11-23 MED ORDER — CARVEDILOL 6.25 MG PO TABS: ORAL_TABLET | ORAL | Status: DC

## 2013-11-23 NOTE — Progress Notes (Signed)
This encounter was created in error - please disregard.

## 2013-11-23 NOTE — Progress Notes (Deleted)
Cardiology Office Note    Date:  11/23/2013   ID:  Brinklee, Cisse 16-Sep-1965, MRN [id number]  PCP:  No PCP Per Patient  Cardiologist:  Dr. Dorris Carnes   Electrophysiologist:  ***   History of Present Illness: Katrina Carrillo is a 48 y.o. female with a hx of NICM, systolic CHF, HTN, GERD.  EF was previously 15% >>> improved to 40-45% by last echo in 10/2012.  Last seen by Dr. Dorris Carnes 08/2013.    ***   Studies:  - LHC (01/2009): Normal coronary arteries, EF 15%  - Echo (09/2010): EF 94-17%, grade 2 diastolic dysfunction, trivial MR, mild LAE  - Echo (4/14):  EF 30%, diff HK.  - Echo (8/14):  EF 40-45%  Recent Labs/Images: 03/04/2013: Direct LDL 147.2; Hemoglobin 12.6  09/06/2013: Creatinine 0.7; HDL Cholesterol by NMR 59.40; LDL (calc) 113*; Potassium 3.8   Mm Digital Screening  01/19/2013   CLINICAL DATA:  Screening.  EXAM: DIGITAL SCREENING BILATERAL MAMMOGRAM WITH CAD  DIGITAL BREAST TOMOSYNTHESIS  Digital breast tomosynthesis images are acquired in two projections. These images are reviewed in combination with the digital mammogram, confirming the findings below.  COMPARISON:  Previous exam(s).  ACR Breast Density Category c: The breasts are heterogeneously dense, which may obscure small masses.  FINDINGS: There are no findings suspicious for malignancy. Images were processed with CAD.  IMPRESSION: No mammographic evidence of malignancy. A result letter of this screening mammogram will be mailed directly to the patient.  RECOMMENDATION: Screening mammogram in one year. (Code:SM-B-01Y)  BI-RADS CATEGORY  1: Negative   Electronically Signed   By: Lovey Newcomer M.D.   On: 01/19/2013 16:29     Wt Readings from Last 3 Encounters:  11/19/13 196 lb (88.905 kg)  09/06/13 189 lb (85.73 kg)  03/04/13 196 lb (88.905 kg)     Past Medical History  Diagnosis Date  . Hypertension   . GERD (gastroesophageal reflux disease)   . Seasonal allergies   . Nonischemic  cardiomyopathy     LHC 01/2009: Normal coronary arteries, EF 15%. Last echo 09/2010: EF 40-81%, grade 2 diastolic dysfunction, trivial MR, mild LAE.8/14 EF 40-45%   . Diverticulosis     Current Outpatient Prescriptions  Medication Sig Dispense Refill  . benzonatate (TESSALON) 100 MG capsule Take 1-2 capsules (100-200 mg total) by mouth 3 (three) times daily as needed for cough.  40 capsule  0  . carvedilol (COREG) 6.25 MG tablet take 2 tablets by mouth every 12 hours  360 tablet  0  . cetirizine (ZYRTEC) 10 MG tablet Take 10 mg by mouth daily.      . digoxin (LANOXIN) 0.125 MG tablet Take 1 tablet (0.125 mg total) by mouth daily.  30 tablet  5  . enalapril (VASOTEC) 5 MG tablet take 1 tablet by mouth twice a day  180 tablet  0  . furosemide (LASIX) 40 MG tablet take 1 tablet by mouth twice a day  180 tablet  0  . Guaifenesin (MUCINEX MAXIMUM STRENGTH) 1200 MG TB12 Take 1 tablet (1,200 mg total) by mouth every 12 (twelve) hours as needed.  14 tablet  1  . hydrALAZINE (APRESOLINE) 25 MG tablet Take 1 tablet (25 mg total) by mouth 2 (two) times daily.  180 tablet  0  . ipratropium (ATROVENT) 0.03 % nasal spray Place 2 sprays into both nostrils 2 (two) times daily.  30 mL  0  . isosorbide mononitrate (IMDUR) 30 MG 24  hr tablet take 1 tablet by mouth once daily  90 tablet  0  . Multiple Vitamin (MULTIVITAMIN) tablet Take 1 tablet by mouth daily.        . potassium chloride SA (K-DUR,KLOR-CON) 20 MEQ tablet take 2 tablets by mouth twice a day  180 tablet  3   No current facility-administered medications for this visit.     Allergies:   Peanut-containing drug products; Shellfish allergy; and Iodine   Social History:  The patient  reports that she quit smoking about 25 years ago. Her smoking use included Cigarettes. She smoked 0.00 packs per day for 2 years. She has never used smokeless tobacco. She reports that she drinks alcohol. She reports that she does not use illicit drugs.   Family History:   The patient's family history includes Cancer in her father and paternal grandmother; Colon cancer (age of onset: 34) in her father; Coronary artery disease in her father; Diabetes in her maternal grandmother and mother; Hypertension in her other; Mental retardation in her son. There is no history of Stomach cancer.   ROS:  Please see the history of present illness.   ***   All other systems reviewed and negative.   PHYSICAL EXAM: VS:  There were no vitals taken for this visit. Well nourished, well developed, in no acute distress HEENT: normal Neck: ***no JVD Cardiac:  normal S1, S2; ***RRR; no murmur Lungs:  ***clear to auscultation bilaterally, no wheezing, rhonchi or rales Abd: soft, nontender, no hepatomegaly Ext: ***no edema Skin: warm and dry Neuro:  CNs 2-12 intact, no focal abnormalities noted  EKG:  ***     ASSESSMENT AND PLAN:  Nonischemic cardiomyopathy  Chronic systolic heart failure  HYPERTENSION    Disposition:  ***   Signed, Versie Starks, MHS 11/23/2013 8:49 AM    [facility] [place], [place], [place]  [address] Phone: [phone]; Fax: [phone]

## 2013-11-24 ENCOUNTER — Telehealth: Payer: Self-pay

## 2013-11-24 MED ORDER — AMOXICILLIN 875 MG PO TABS: 875.0000 mg | ORAL_TABLET | Freq: Two times a day (BID) | ORAL | Status: AC

## 2013-11-24 NOTE — Telephone Encounter (Signed)
Lm for rtn call 

## 2013-11-24 NOTE — Telephone Encounter (Signed)
Katrina Carrillo - Pt. Saw you Friday and was prescribed cough medicine.  Says she needs an antibiotic and wants to know if you will call in one for her.  [phone]

## 2013-11-24 NOTE — Telephone Encounter (Signed)
Pt reports she still has a dry cough and congestion in her face. She states she did not take the Musinex. She states she has been sick for a total of 10 days and believes this has turned into an infection. She would like this called into her [employer].

## 2013-11-24 NOTE — Telephone Encounter (Signed)
Rx sent. Encourage her to take the Mucinex.  Meds ordered this encounter  Medications  . amoxicillin (AMOXIL) 875 MG tablet    Sig: Take 1 tablet (875 mg total) by mouth 2 (two) times daily.    Dispense:  20 tablet    Refill:  0    Order Specific Question:  Supervising Provider    Answer:  DOOLITTLE, ROBERT P [[id number]]

## 2013-11-25 NOTE — Telephone Encounter (Signed)
Spoke with pt and let her know the rx is at the pharmacy. I tried to encourage her to take the Mucinex. She said she will try to take it.

## 2013-11-26 ENCOUNTER — Other Ambulatory Visit: Payer: Self-pay | Admitting: Internal Medicine

## 2013-12-15 ENCOUNTER — Other Ambulatory Visit: Payer: Self-pay | Admitting: Internal Medicine

## 2013-12-26 ENCOUNTER — Other Ambulatory Visit: Payer: Self-pay | Admitting: Internal Medicine

## 2014-01-29 ENCOUNTER — Encounter: Payer: Self-pay | Admitting: Physician Assistant

## 2014-01-29 DIAGNOSIS — J3089 Other allergic rhinitis: Principal | ICD-10-CM

## 2014-01-29 DIAGNOSIS — H1010 Acute atopic conjunctivitis, unspecified eye: Secondary | ICD-10-CM | POA: Insufficient documentation

## 2014-01-29 DIAGNOSIS — J302 Other seasonal allergic rhinitis: Principal | ICD-10-CM

## 2014-02-11 ENCOUNTER — Other Ambulatory Visit: Payer: Self-pay

## 2014-02-11 DIAGNOSIS — Z1231 Encounter for screening mammogram for malignant neoplasm of breast: Secondary | ICD-10-CM

## 2014-02-19 ENCOUNTER — Other Ambulatory Visit: Payer: Self-pay | Admitting: Internal Medicine

## 2014-02-23 ENCOUNTER — Other Ambulatory Visit: Payer: Self-pay | Admitting: Internal Medicine

## 2014-03-08 ENCOUNTER — Ambulatory Visit: Payer: BC Managed Care – PPO

## 2014-03-12 ENCOUNTER — Ambulatory Visit (INDEPENDENT_AMBULATORY_CARE_PROVIDER_SITE_OTHER): Payer: BC Managed Care – PPO | Admitting: Physician Assistant

## 2014-03-12 VITALS — BP 112/70 | HR 80 | Temp 98.4°F | Resp 16 | Ht 63.0 in | Wt 196.4 lb

## 2014-03-12 DIAGNOSIS — J069 Acute upper respiratory infection, unspecified: Secondary | ICD-10-CM

## 2014-03-12 DIAGNOSIS — B9789 Other viral agents as the cause of diseases classified elsewhere: Principal | ICD-10-CM

## 2014-03-12 MED ORDER — ACETAMINOPHEN 500 MG PO TABS: 1000.0000 mg | ORAL_TABLET | Freq: Three times a day (TID) | ORAL | Status: DC | PRN

## 2014-03-12 MED ORDER — GUAIFENESIN ER 1200 MG PO TB12: 1.0000 | ORAL_TABLET | Freq: Two times a day (BID) | ORAL | Status: DC | PRN

## 2014-03-12 NOTE — Progress Notes (Signed)
IDENTIFYING INFORMATION  Katrina Carrillo / DOB: Nov 29, 1965 / MRN: [id number]  The patient has LIVER HEMANGIOMA; HYPERTENSION; [place]; THYROMEGALY; Nonischemic cardiomyopathy; and Allergic rhinoconjunctivitis, seasonal and perennial on her problem list.  SUBJECTIVE  CC: Nasal Congestion   HPI: Ms. Katrina Carrillo is a 48 y.o. y.o. female with an extensive medical history presenting for sinus congestion that started on Thursday.  She was able to work on Friday, but has felt "run down" since that time.  She has taken Mucinex with Tylenol and phenylephrine and has had some good relief with this.  She has also tried saline rinse, Zytec, Nasonex, and albuterol which have helped mildly.   She denies fever, throat pain and ear problems.   She  has a past medical history of Hypertension; GERD (gastroesophageal reflux disease); Seasonal allergies; Nonischemic cardiomyopathy; Diverticulosis; GASTROESOPHAGEAL REFLUX DISEASE (02/22/2009); and Family history of malignant neoplasm of gastrointestinal tract (02/17/2012).    She has a current medication list which includes the following prescription(s): carvedilol, cetirizine, digoxin, enalapril, furosemide, guaifenesin, hydralazine, ipratropium, isosorbide mononitrate, multivitamin, potassium chloride sa, and benzonatate.  Ms. Katrina Carrillo is allergic to peanut-containing drug products; shellfish allergy; and iodine. She  reports that she quit smoking about 25 years ago. Her smoking use included Cigarettes. She smoked 0.00 packs per day for 2 years. She has never used smokeless tobacco. She reports that she drinks alcohol. She reports that she does not use illicit drugs. She  has no sexual activity history on file.  The patient  has past surgical history that includes Tubal ligation.  Her family history includes Cancer in her father and paternal grandmother; Colon cancer (age of onset: 91) in her father; Coronary artery  disease in her father; Diabetes in her maternal grandmother and mother; Hypertension in her other; Mental retardation in her son. There is no history of Stomach cancer.  Review of Systems  Constitutional: Negative for fever, chills and diaphoresis.  HENT: Positive for congestion. Negative for sore throat.   Respiratory: Positive for cough (mild). Negative for sputum production and wheezing.   Cardiovascular: Negative for chest pain.  Gastrointestinal: Negative.   Genitourinary: Negative.   Musculoskeletal: Positive for myalgias.  Skin: Negative for itching and rash.  Neurological: Negative for dizziness, weakness and headaches.    OBJECTIVE  Blood pressure 112/70, pulse 80, temperature 98.4 F (36.9 C), temperature source Oral, resp. rate 16, height 5\' 3"  (1.6 m), weight 196 lb 6 oz (89.075 kg), SpO2 99 %. The patient's body mass index is 34.79 kg/(m^2).  Physical Exam  Constitutional: She is oriented to person, place, and time. She appears well-developed and well-nourished. No distress.  HENT:  Nose: Mucosal edema and rhinorrhea (clear) present. No sinus tenderness. Right sinus exhibits no maxillary sinus tenderness and no frontal sinus tenderness. Left sinus exhibits no maxillary sinus tenderness and no frontal sinus tenderness.  Mouth/Throat: Uvula is midline, oropharynx is clear and moist and mucous membranes are normal.  Neck: Normal range of motion.  Cardiovascular: Normal rate and regular rhythm.   Respiratory: Effort normal and breath sounds normal.  Musculoskeletal: Normal range of motion.  Neurological: She is alert and oriented to person, place, and time.  Skin: Skin is warm and dry. She is not diaphoretic.  Psychiatric: She has a normal mood and affect.    No results found for this or any previous visit (from the past 24 hour(s)).  ASSESSMENT & PLAN  Katrina Carrillo was seen today for nasal congestion.  Diagnoses  and associated orders for this visit:  Viral URI with  cough - Guaifenesin (MUCINEX MAXIMUM STRENGTH) 1200 MG TB12; Take 1 tablet (1,200 mg total) by mouth every 12 (twelve) hours as needed. - acetaminophen (TYLENOL) 500 MG tablet; Take 2 tablets (1,000 mg total) by mouth every 8 (eight) hours as needed. Take 2 tabs every 8 hours. -     Patient advised to avoid NSAIDS and decongestants given hx of CHF. -     Patient advised to use a netty pot three times daily for nasal congestion, to continue her Zyrtec daily, and to continue her nose spray daily.    The patient was instructed to to call or comeback to clinic as needed, or should symptoms warrant.  Katrina Carrillo, MHS, PA-C [facility] 03/12/2014 5:17 PM

## 2014-03-12 NOTE — Patient Instructions (Signed)
Use the [facility] three times daily with saline packets and bottled water.  Look up you tube video to see how its done. Continue your Zyrtec and steroid nose spray.    Exercise improves every system in the body.  It lowers the risk of heart disease, decreases blood pressure, reduces the symptoms of depression and anxiety, and lowers blood sugar. To receive these benefits, try to get 150 minutes of planned exercise each week.  You can break this 150 minutes up however you like.  For instance, you can perform 30 minutes of brisk walking 5 days a week, or perform 50 minutes 3 days a week.  If you don't like walking, or can't find a safe place to walk, find another way to move that you can enjoy.  Exercise tapes, cycling, stair climbing, swimming, or a combination will be just as good as a walking program. To ensure the proper intensity, you can use the talk test. Essentially, you should be able to carry on a conversation, but you should have to take short breaks from the conversation in order catch your breath.

## 2014-03-25 ENCOUNTER — Other Ambulatory Visit: Payer: Self-pay | Admitting: Internal Medicine

## 2014-03-29 ENCOUNTER — Ambulatory Visit
Admission: RE | Admit: 2014-03-29 | Discharge: 2014-03-29 | Disposition: A | Discharge status: Home / Self Care | Payer: BC Managed Care – PPO | Source: Ambulatory Visit

## 2014-03-29 DIAGNOSIS — Z1231 Encounter for screening mammogram for malignant neoplasm of breast: Secondary | ICD-10-CM

## 2014-04-05 ENCOUNTER — Encounter: Payer: Self-pay | Admitting: Internal Medicine

## 2014-04-16 ENCOUNTER — Other Ambulatory Visit: Payer: Self-pay | Admitting: Internal Medicine

## 2014-04-24 ENCOUNTER — Other Ambulatory Visit: Payer: Self-pay | Admitting: Internal Medicine

## 2014-05-18 NOTE — Progress Notes (Signed)
Cardiology Office Note   Date:  05/19/2014   ID:  Charma, Mocarski 1965/10/19, MRN [id number]  PCP:  Sharene Butters, MD  Cardiologist:   Dorris Carnes, MD   Chief Complaint  Patient presents with  . Weight Gain      History of Present Illness: Katrina Carrillo is a 49 y.o. female with a history of NICM, HTN, GERD. LHC 01/2009: Normal coronary arteries, EF 15%. Last Echo was done LVEF 40 to 45%  I saw hier back last summer Since then, breathing has good.  Had URI this winter NO edema  Takes Lasix usually 1x per day  2 if puffy         Current Outpatient Prescriptions  Medication Sig Dispense Refill  . carvedilol (COREG) 6.25 MG tablet take 2 tablets by mouth every 12 hours 360 tablet 0  . cetirizine (ZYRTEC) 10 MG tablet Take 10 mg by mouth daily.    Marland Kitchen DIGITEK 125 MCG tablet take 1 tablet by mouth once daily 30 tablet 0  . enalapril (VASOTEC) 5 MG tablet take 1 tablet by mouth twice a day 180 tablet 0  . furosemide (LASIX) 40 MG tablet take 1 tablet by mouth twice a day 180 tablet 0  . hydrALAZINE (APRESOLINE) 25 MG tablet take 1 tablet by mouth twice a day 180 tablet 0  . isosorbide mononitrate (IMDUR) 30 MG 24 hr tablet take 1 tablet by mouth once daily 90 tablet 3  . Multiple Vitamin (MULTIVITAMIN) tablet Take 1 tablet by mouth daily.      Marland Kitchen NASONEX 50 MCG/ACT nasal spray Place 2 sprays into both nostrils daily.   0  . potassium chloride SA (K-DUR,KLOR-CON) 20 MEQ tablet take 2 tablets by mouth twice a day 120 tablet 3  . QVAR 80 MCG/ACT inhaler Inhale 2 puffs into the lungs 2 (two) times daily.  0   No current facility-administered medications for this visit.    Allergies:   Peanut-containing drug products; Shellfish allergy; and Iodine   Past Medical History  Diagnosis Date  . Hypertension   . GERD (gastroesophageal reflux disease)   . Seasonal allergies   . Nonischemic cardiomyopathy     LHC 01/2009: Normal coronary arteries, EF 15%.  Last echo 09/2010: EF 31-51%, grade 2 diastolic dysfunction, trivial MR, mild LAE.8/14 EF 40-45%   . Diverticulosis   . GASTROESOPHAGEAL REFLUX DISEASE 02/22/2009    Qualifier: Diagnosis of  By: Ronnald Ramp, CNA, Deborah    . Family history of malignant neoplasm of gastrointestinal tract 02/17/2012    Father with colon cancer age 30     Past Surgical History  Procedure Laterality Date  . Tubal ligation      S/P     Social History:  The patient  reports that she quit smoking about 26 years ago. Her smoking use included Cigarettes. She quit after 2 years of use. She has never used smokeless tobacco. She reports that she drinks alcohol. She reports that she does not use illicit drugs.   Family History:  The patient's family history includes Cancer in her father and paternal grandmother; Colon cancer (age of onset: 90) in her father; Coronary artery disease in her father; Diabetes in her maternal grandmother and mother; Hypertension in her other; Mental retardation in her son. There is no history of Stomach cancer.    ROS:  Please see the history of present illness. All other systems are reviewed and  Negative to the above problem except  as noted.    PHYSICAL EXAM: VS:  BP 114/68 mmHg  Pulse 61  Ht 5\' 3"  (1.6 m)  Wt 198 lb 6.4 oz (89.994 kg)  BMI 35.15 kg/m2  SpO2 98%  GEN: Well nourished, well developed, in no acute distress HEENT: normal Neck: no JVD, carotid bruits, or masses Cardiac: RRR; no murmurs, rubs, or gallops,no edema  Respiratory:  clear to auscultation bilaterally, normal work of breathing GI: soft, nontender, nondistended, + BS  No hepatomegaly  MS: no deformity Moving all extremities   Skin: warm and dry, no rash Neuro:  Strength and sensation are intact Psych: euthymic mood, full affect   EKG:  EKG is  Not ordered today.   Lipid Panel    Component Value Date/Time   CHOL 189 09/06/2013 1014   TRIG 85.0 09/06/2013 1014   HDL 59.40 09/06/2013 1014   CHOLHDL 3  09/06/2013 1014   VLDL 17.0 09/06/2013 1014   LDLCALC 113* 09/06/2013 1014   LDLDIRECT 147.2 03/04/2013 1004      Wt Readings from Last 3 Encounters:  05/19/14 198 lb 6.4 oz (89.994 kg)  03/12/14 196 lb 6 oz (89.075 kg)  11/19/13 196 lb (88.905 kg)      ASSESSMENT AND PLAN: 1.  NICM  Continue meds Volume status looks good  I think weight gain is fat, not fluid   Stop Dig.    2  HTn  Good contrl  Stay active   Watch po intake   Current medicines are reviewed at length with the patient today.  The patient does not have concerns regarding medicines.  The following changes have been made: Stop Dig  Labs/ tests ordered today include:  CBC and BMET   No orders of the defined types were placed in this encounter.     Disposition:   FU with  in   Signed, Dorris Carnes, MD  05/19/2014 10:40 AM    [name], [place], [place]  [address] Phone: [phone]; Fax: [phone]

## 2014-05-19 ENCOUNTER — Encounter: Payer: Self-pay | Admitting: Internal Medicine

## 2014-05-19 ENCOUNTER — Ambulatory Visit (INDEPENDENT_AMBULATORY_CARE_PROVIDER_SITE_OTHER): Payer: BC Managed Care – PPO | Admitting: Internal Medicine

## 2014-05-19 VITALS — BP 114/68 | HR 61 | Ht 63.0 in | Wt 198.4 lb

## 2014-05-19 DIAGNOSIS — I10 Essential (primary) hypertension: Secondary | ICD-10-CM

## 2014-05-19 DIAGNOSIS — I429 Cardiomyopathy, unspecified: Secondary | ICD-10-CM

## 2014-05-19 DIAGNOSIS — I5022 Chronic systolic (congestive) heart failure: Secondary | ICD-10-CM

## 2014-05-19 DIAGNOSIS — I428 Other cardiomyopathies: Secondary | ICD-10-CM

## 2014-05-19 LAB — BASIC METABOLIC PANEL
BUN: 11 mg/dL (ref 6–23)
CHLORIDE: 105 meq/L (ref 96–112)
CO2: 29 mEq/L (ref 19–32)
Calcium: 9.5 mg/dL (ref 8.4–10.5)
Creatinine, Ser: 0.68 mg/dL (ref 0.40–1.20)
GFR: 118.1 mL/min (ref >60.00)
GLUCOSE: 90 mg/dL (ref 70–99)
POTASSIUM: 3.9 meq/L (ref 3.5–5.1)
Sodium: 139 mEq/L (ref 135–145)

## 2014-05-19 LAB — CBC
HCT: 40.5 % (ref 36.0–46.0)
Hemoglobin: 13.5 g/dL (ref 12.0–15.0)
MCHC: 33.3 g/dL (ref 30.0–36.0)
MCV: 79.2 fl (ref 78.0–100.0)
PLATELETS: 254 10*3/uL (ref 150.0–400.0)
RBC: 5.12 Mil/uL — AB (ref 3.87–5.11)
RDW: 15.3 % (ref 11.5–15.5)
WBC: 9.2 10*3/uL (ref 4.0–10.5)

## 2014-05-19 NOTE — Patient Instructions (Addendum)
Your physician recommends that you continue on your current medications as directed. Please refer to the Current Medication list given to you today. Your physician recommends that you return for lab work in: ([place], bmet) Your physician wants you to follow-up in: November 2016 with Dr. Harrington Challenger.  You will receive a reminder letter in the mail two months in advance. If you don't receive a letter, please call our office to schedule the follow-up appointment.

## 2014-06-06 ENCOUNTER — Other Ambulatory Visit: Payer: Self-pay | Admitting: Internal Medicine

## 2014-07-28 ENCOUNTER — Other Ambulatory Visit: Payer: Self-pay | Admitting: Internal Medicine

## 2014-08-27 ENCOUNTER — Other Ambulatory Visit: Payer: Self-pay | Admitting: Internal Medicine

## 2014-08-29 ENCOUNTER — Other Ambulatory Visit: Payer: Self-pay | Admitting: Internal Medicine

## 2014-08-30 NOTE — Telephone Encounter (Signed)
Per note 2.18.16

## 2014-09-16 ENCOUNTER — Other Ambulatory Visit: Payer: Self-pay | Admitting: Internal Medicine

## 2015-01-20 ENCOUNTER — Telehealth: Payer: Self-pay | Admitting: Internal Medicine

## 2015-01-20 NOTE — Telephone Encounter (Signed)
I left a message for the patient to call back 

## 2015-01-20 NOTE — Telephone Encounter (Signed)
New Message   Pt c/o of Chest Pain: 1. Are you having CP right now? No not really. A little more severe early in the week. Pt states she feels a pull in her chest. She felt like it was a little acid reflux which is not normal for her.  2. Are you experiencing any other symptoms (ex. SOB, nausea, vomiting, sweating)? No.  3. How long have you been experiencing CP? For 3-4 days  4. Is your CP continuous or coming and going? Coming and going  5. Have you taken Nitroglycerin? No

## 2015-01-23 ENCOUNTER — Encounter: Payer: Self-pay | Admitting: Physician Assistant

## 2015-01-23 DIAGNOSIS — IMO0002 Reserved for concepts with insufficient information to code with codable children: Secondary | ICD-10-CM

## 2015-01-27 NOTE — Telephone Encounter (Signed)
Left a message for the patient to call back if needing to talk to a nurse.

## 2015-01-29 ENCOUNTER — Other Ambulatory Visit: Payer: Self-pay | Admitting: Internal Medicine

## 2015-02-02 ENCOUNTER — Other Ambulatory Visit: Payer: Self-pay | Admitting: Family Medicine

## 2015-02-02 DIAGNOSIS — R222 Localized swelling, mass and lump, trunk: Secondary | ICD-10-CM

## 2015-02-03 ENCOUNTER — Ambulatory Visit
Admission: RE | Admit: 2015-02-03 | Discharge: 2015-02-03 | Disposition: A | Discharge status: Home / Self Care | Payer: BC Managed Care – PPO | Source: Ambulatory Visit | Attending: Family Medicine | Admitting: Family Medicine

## 2015-02-03 DIAGNOSIS — R222 Localized swelling, mass and lump, trunk: Secondary | ICD-10-CM

## 2015-02-04 NOTE — Progress Notes (Signed)
This encounter was created in error - please disregard.

## 2015-02-05 ENCOUNTER — Telehealth: Payer: Self-pay | Admitting: Physician Assistant

## 2015-02-05 ENCOUNTER — Other Ambulatory Visit: Payer: Self-pay | Admitting: Internal Medicine

## 2015-02-05 MED ORDER — POTASSIUM CHLORIDE CRYS ER 20 MEQ PO TBCR: 40.0000 meq | EXTENDED_RELEASE_TABLET | Freq: Two times a day (BID) | ORAL | Status: DC

## 2015-02-05 MED ORDER — CARVEDILOL 6.25 MG PO TABS: 12.5000 mg | ORAL_TABLET | Freq: Two times a day (BID) | ORAL | Status: DC

## 2015-02-05 NOTE — Telephone Encounter (Signed)
Error  See other note

## 2015-02-05 NOTE — Telephone Encounter (Signed)
Paged regarding patient medications by answering service. It took 5 return call to reach patient after being paged. She run out of Coreg and [place] last Friday 10/30 per patient and since then she has not taken this medication. She checked to pharmacy afterwards but no refilled was given. Yesterday 02/04/15  she had mild chest discomfort described as "someting is pulling/indigestion" that was resolved by itself in few hours. She also had some fluttering while drinking coffee for few minutes. No further episode. She is feeling great today. She also missed few dose of Imdur last week. She said she is taking other medications. The patient denies nausea, vomiting, fever, shortness of breath, orthopnea, PND, dizziness, syncope, cough, congestion, abdominal pain, hematochezia, melena, lower extremity edema.  Seems patient is non-compliant and poor historian. Rx given for coreg 6.25mg , take 2 tablet (12.5mg ) BID and Kdur 20 meq, take 2 table (35meq) BID. No refill given. The patient wanted to keep rx same as previously because it easy to swallow. Last K was 3.9 05/19/14. The patient has outpatient appointment 02/07/15. Strongly encouraged to go to appointment. She will need BMET during office visit to check potassium.   Lorita Forinash, [place]

## 2015-02-07 ENCOUNTER — Encounter: Payer: BC Managed Care – PPO | Admitting: Physician Assistant

## 2015-02-07 DIAGNOSIS — R0989 Other specified symptoms and signs involving the circulatory and respiratory systems: Secondary | ICD-10-CM

## 2015-02-08 ENCOUNTER — Encounter: Payer: Self-pay | Admitting: Physician Assistant

## 2015-03-01 ENCOUNTER — Other Ambulatory Visit: Payer: Self-pay | Admitting: General Surgery

## 2015-03-01 DIAGNOSIS — R221 Localized swelling, mass and lump, neck: Secondary | ICD-10-CM

## 2015-03-08 ENCOUNTER — Other Ambulatory Visit: Payer: Self-pay | Admitting: Internal Medicine

## 2015-03-10 ENCOUNTER — Ambulatory Visit
Admission: RE | Admit: 2015-03-10 | Discharge: 2015-03-10 | Disposition: A | Discharge status: Home / Self Care | Payer: BC Managed Care – PPO | Source: Ambulatory Visit | Attending: General Surgery | Admitting: General Surgery

## 2015-03-10 DIAGNOSIS — R221 Localized swelling, mass and lump, neck: Secondary | ICD-10-CM

## 2015-03-10 MED ORDER — GADOBENATE DIMEGLUMINE 529 MG/ML IV SOLN
19.0000 mL | Freq: Once | INTRAVENOUS | Status: AC | PRN
  Administered 2015-03-10: 19 mL via INTRAVENOUS

## 2015-03-13 ENCOUNTER — Encounter: Payer: Self-pay | Admitting: Internal Medicine

## 2015-03-13 ENCOUNTER — Ambulatory Visit (INDEPENDENT_AMBULATORY_CARE_PROVIDER_SITE_OTHER): Payer: BC Managed Care – PPO | Admitting: Internal Medicine

## 2015-03-13 VITALS — BP 124/68 | HR 83 | Ht 63.0 in | Wt 201.8 lb

## 2015-03-13 DIAGNOSIS — I5022 Chronic systolic (congestive) heart failure: Secondary | ICD-10-CM | POA: Diagnosis not present

## 2015-03-13 DIAGNOSIS — I428 Other cardiomyopathies: Secondary | ICD-10-CM

## 2015-03-13 DIAGNOSIS — I429 Cardiomyopathy, unspecified: Secondary | ICD-10-CM

## 2015-03-13 LAB — BASIC METABOLIC PANEL
BUN: 14 mg/dL (ref 7–25)
CALCIUM: 9.4 mg/dL (ref 8.6–10.4)
CO2: 28 mmol/L (ref 20–31)
Chloride: 102 mmol/L (ref 98–110)
Creat: 0.63 mg/dL (ref 0.50–1.05)
GLUCOSE: 86 mg/dL (ref 65–99)
Potassium: 4 mmol/L (ref 3.5–5.3)
SODIUM: 138 mmol/L (ref 135–146)

## 2015-03-13 LAB — LIPID PANEL
CHOL/HDL RATIO: 3.2 ratio (ref <=5.0)
CHOLESTEROL: 186 mg/dL (ref 125–200)
HDL: 59 mg/dL (ref >=46)
LDL Cholesterol: 115 mg/dL (ref <130)
TRIGLYCERIDES: 61 mg/dL (ref <150)
VLDL: 12 mg/dL (ref <30)

## 2015-03-13 LAB — CBC
HCT: 40.8 % (ref 36.0–46.0)
HEMOGLOBIN: 13.5 g/dL (ref 12.0–15.0)
MCH: 26.6 pg (ref 26.0–34.0)
MCHC: 33.1 g/dL (ref 30.0–36.0)
MCV: 80.3 fL (ref 78.0–100.0)
MPV: 9.7 fL (ref 8.6–12.4)
PLATELETS: 272 10*3/uL (ref 150–400)
RBC: 5.08 MIL/uL (ref 3.87–5.11)
RDW: 14.9 % (ref 11.5–15.5)
WBC: 10 10*3/uL (ref 4.0–10.5)

## 2015-03-13 MED ORDER — ISOSORBIDE MONONITRATE ER 30 MG PO TB24: 30.0000 mg | ORAL_TABLET | Freq: Every day | ORAL | Status: DC

## 2015-03-13 MED ORDER — FUROSEMIDE 40 MG PO TABS: 40.0000 mg | ORAL_TABLET | Freq: Two times a day (BID) | ORAL | Status: DC

## 2015-03-13 MED ORDER — HYDRALAZINE HCL 25 MG PO TABS: 25.0000 mg | ORAL_TABLET | Freq: Two times a day (BID) | ORAL | Status: DC

## 2015-03-13 NOTE — Progress Notes (Signed)
Cardiology Office Note   Date:  03/13/2015   ID:  Katrina Carrillo, DOB 24-Oct-1965, MRN [id number]  PCP:  Reginia Naas, MD  Cardiologist:   Dorris Carnes, MD   No chief complaint on file.     History of Present Illness: Katrina Carrillo is a 49 y.o. female with a history of NICM, GERD, HTN.  Normal coronary arteries at cath in 2010  LVEF 15% at time  Last echo 40 to 45%   SInce she was in clinic she has done fairly well  No CP  Breathing is OK  No PND  No signif edema        Current Outpatient Prescriptions  Medication Sig Dispense Refill  . carvedilol (COREG) 6.25 MG tablet Take 2 tablets (12.5 mg total) by mouth every 12 (twelve) hours. 120 tablet 0  . cetirizine (ZYRTEC) 10 MG tablet Take 10 mg by mouth daily.    . enalapril (VASOTEC) 5 MG tablet take 1 tablet by mouth twice a day 180 tablet 1  . furosemide (LASIX) 40 MG tablet take 1 tablet by mouth twice a day 60 tablet 5  . hydrALAZINE (APRESOLINE) 25 MG tablet take 1 tablet by mouth twice a day 180 tablet 1  . isosorbide mononitrate (IMDUR) 30 MG 24 hr tablet take 1 tablet by mouth once daily 90 tablet 0  . Multiple Vitamin (MULTIVITAMIN) tablet Take 1 tablet by mouth daily.      Marland Kitchen NASONEX 50 MCG/ACT nasal spray Place 2 sprays into both nostrils daily.   0  . potassium chloride SA (K-DUR,KLOR-CON) 20 MEQ tablet Take 2 tablets (40 mEq total) by mouth 2 (two) times daily. 120 tablet 0  . QVAR 80 MCG/ACT inhaler Inhale 2 puffs into the lungs 2 (two) times daily.  0   No current facility-administered medications for this visit.    Allergies:   Peanut-containing drug products; Shellfish allergy; and Iodine   Past Medical History  Diagnosis Date  . Hypertension   . GERD (gastroesophageal reflux disease)   . Seasonal allergies   . Nonischemic cardiomyopathy ([place])     [place] 01/2009: Normal coronary arteries, EF 15%. Last echo 09/2010: EF 123456, grade 2 diastolic dysfunction, trivial MR, mild LAE.8/14  EF 40-45%   . Diverticulosis   . GASTROESOPHAGEAL REFLUX DISEASE 02/22/2009    Qualifier: Diagnosis of  By: Ronnald Ramp, CNA, Deborah    . Family history of malignant neoplasm of gastrointestinal tract 02/17/2012    Father with colon cancer age 30     Past Surgical History  Procedure Laterality Date  . Tubal ligation      S/P     Social History:  The patient  reports that she quit smoking about 26 years ago. Her smoking use included Cigarettes. She quit after 2 years of use. She has never used smokeless tobacco. She reports that she drinks alcohol. She reports that she does not use illicit drugs.   Family History:  The patient's family history includes Cancer in her father and paternal grandmother; Colon cancer (age of onset: 47) in her father; Coronary artery disease in her father; Diabetes in her maternal grandmother and mother; Hypertension in her other; Mental retardation in her son. There is no history of Stomach cancer.    ROS:  Please see the history of present illness. All other systems are reviewed and  Negative to the above problem except as noted.    PHYSICAL EXAM: VS:  BP 124/68 mmHg  Pulse  83  Ht 5\' 3"  (1.6 m)  Wt 91.536 kg (201 lb 12.8 oz)  BMI 35.76 kg/m2  GEN: Well nourished, well developed, in no acute distress HEENT: normal Neck: no JVD, carotid bruits, or masses Cardiac: RRR; no murmurs, rubs, or gallops,no edema  Respiratory:  clear to auscultation bilaterally, normal work of breathing GI: soft, nontender, nondistended, + BS  No hepatomegaly  MS: no deformity Moving all extremities   Skin: warm and dry, no rash Neuro:  Strength and sensation are intact Psych: euthymic mood, full affect   EKG:  EKG is  ordered today.  SR 83     Lipid Panel    Component Value Date/Time   CHOL 189 09/06/2013 1014   TRIG 85.0 09/06/2013 1014   HDL 59.40 09/06/2013 1014   CHOLHDL 3 09/06/2013 1014   VLDL 17.0 09/06/2013 1014   LDLCALC 113* 09/06/2013 1014   LDLDIRECT  147.2 03/04/2013 1004      Wt Readings from Last 3 Encounters:  03/13/15 91.536 kg (201 lb 12.8 oz)  05/19/14 89.994 kg (198 lb 6.4 oz)  03/12/14 89.075 kg (196 lb 6 oz)      ASSESSMENT AND PLAN:  1.  NICM  Volume statu looks good  Keep on same regimen  She does not want to switch to aldactone  Wants to stay on Hydralzine / imdur  2  HTN  Adequate control  3  Sleep  Pt concerned she may have sleep apnea.  Daytime somnolence.  WIll set up for sleep study after tge holidays    Check BMET, CBC and lipids today   F/u in Late July August     Signed, Dorris Carnes, MD  03/13/2015 8:35 AM    [address], [place], [place]  [address] Phone: [phone]; Fax: [phone]

## 2015-03-13 NOTE — Patient Instructions (Signed)
Your physician recommends that you continue on your current medications as directed. Please refer to the Current Medication list given to you today. Your physician recommends that you return for lab work in: TODAY (BMET, CBC, LIPIDS)  Your physician wants you to follow-up in: END OF July 2017 WITH DR. Harrington Challenger.  You will receive a reminder letter in the mail two months in advance. If you don't receive a letter, please call our office to schedule the follow-up appointment.

## 2015-03-25 ENCOUNTER — Other Ambulatory Visit: Payer: Self-pay | Admitting: Physician Assistant

## 2015-04-11 ENCOUNTER — Other Ambulatory Visit: Payer: Self-pay | Admitting: *Deleted

## 2015-04-11 DIAGNOSIS — R400 Somnolence: Secondary | ICD-10-CM

## 2015-04-18 ENCOUNTER — Other Ambulatory Visit: Payer: Self-pay

## 2015-04-18 DIAGNOSIS — Z1231 Encounter for screening mammogram for malignant neoplasm of breast: Secondary | ICD-10-CM

## 2015-05-04 ENCOUNTER — Ambulatory Visit
Admission: RE | Admit: 2015-05-04 | Discharge: 2015-05-04 | Disposition: A | Discharge status: Home / Self Care | Payer: BC Managed Care – PPO | Source: Ambulatory Visit

## 2015-05-04 DIAGNOSIS — Z1231 Encounter for screening mammogram for malignant neoplasm of breast: Secondary | ICD-10-CM

## 2015-06-11 ENCOUNTER — Ambulatory Visit (HOSPITAL_BASED_OUTPATIENT_CLINIC_OR_DEPARTMENT_OTHER): Payer: BC Managed Care – PPO | Attending: Internal Medicine | Admitting: Radiology

## 2015-06-11 VITALS — Ht 63.0 in | Wt 198.0 lb

## 2015-06-11 DIAGNOSIS — R0683 Snoring: Secondary | ICD-10-CM | POA: Diagnosis not present

## 2015-06-11 DIAGNOSIS — G4719 Other hypersomnia: Secondary | ICD-10-CM | POA: Diagnosis not present

## 2015-06-11 DIAGNOSIS — G4733 Obstructive sleep apnea (adult) (pediatric): Secondary | ICD-10-CM | POA: Insufficient documentation

## 2015-06-11 DIAGNOSIS — R400 Somnolence: Secondary | ICD-10-CM

## 2015-06-11 DIAGNOSIS — R5383 Other fatigue: Secondary | ICD-10-CM | POA: Diagnosis not present

## 2015-06-11 DIAGNOSIS — Z79899 Other long term (current) drug therapy: Secondary | ICD-10-CM | POA: Diagnosis not present

## 2015-06-12 ENCOUNTER — Encounter: Payer: Self-pay | Admitting: Cardiology

## 2015-06-12 ENCOUNTER — Telehealth: Payer: Self-pay | Admitting: Cardiology

## 2015-06-12 DIAGNOSIS — G4733 Obstructive sleep apnea (adult) (pediatric): Secondary | ICD-10-CM

## 2015-06-12 HISTORY — DX: Obstructive sleep apnea (adult) (pediatric): G47.33

## 2015-06-12 NOTE — Sleep Study (Signed)
   Patient Name: Katrina Carrillo, Forni MRN: [id number] Study Date: 06/11/2015 Gender: Female D.O.B: 1965-12-01 Age (years): 43 Referring Provider: Dorris Carnes Interpreting Physician: Fransico Him MD, ABSM RPSGT: Carolin Coy  Height (inches): 63 Weight (lbs): 198 BMI: 35 Neck Size: 12.50  CLINICAL INFORMATION Sleep Study Type: NPSG Indication for sleep study: Excessive Daytime Sleepiness, Fatigue, Snoring Epworth Sleepiness Score: 9  SLEEP STUDY TECHNIQUE As per the AASM Manual for the Scoring of Sleep and Associated Events v2.3 (April 2016) with a hypopnea requiring 4% desaturations. The channels recorded and monitored were frontal, central and occipital EEG, electrooculogram (EOG), submentalis EMG (chin), nasal and oral airflow, thoracic and abdominal wall motion, anterior tibialis EMG, snore microphone, electrocardiogram, and pulse oximetry.  MEDICATIONS Patient's medications include: Coreg, Zyrtec, Vasotec, Lasix, Hydralazine, Imdur, Nasonex, Kdur, Qvar.. Medications self-administered by patient during sleep study : No sleep medicine administered.  SLEEP ARCHITECTURE The study was initiated at 11:07:26 PM and ended at 5:07:43 AM. Sleep onset time was 6.2 minutes and the sleep efficiency was 93.7%. The total sleep time was 337.5 minutes. Stage REM latency was 51.0 minutes. The patient spent 4.74% of the night in stage N1 sleep, 71.26% in stage N2 sleep, 3.85% in stage N3 and 20.15% in REM. Alpha intrusion was absent. Supine sleep was 65.45%.  RESPIRATORY PARAMETERS The overall apnea/hypopnea index (AHI) was 8.0 per hour. There were 2 total apneas, including 2 obstructive, 0 central and 0 mixed apneas. There were 43 hypopneas and 14 RERAs. The AHI during Stage REM sleep was 26.5 per hour. AHI while supine was 12.0 per hour. The mean oxygen saturation was 97.08%. The minimum SpO2 during sleep was 70.00%. Loud snoring was noted during this study.  CARDIAC DATA The 2  lead EKG demonstrated sinus rhythm. The mean heart rate was 70.81 beats per minute. Other EKG findings include: None.  LEG MOVEMENT DATA The total PLMS were 4 with a resulting PLMS index of 0.71. Associated arousal with leg movement index was 0.7 .  IMPRESSIONS - Mild obstructive sleep apnea occurred during this study (AHI = 8.0/h). - No significant central sleep apnea occurred during this study (CAI = 0.0/h). - Severe oxygen desaturation was noted during this study (Min O2 = 70.00%). - The patient snored with Loud snoring volume. - No cardiac abnormalities were noted during this study. - Clinically significant periodic limb movements did not occur during sleep. No significant associated arousals.  DIAGNOSIS - Obstructive Sleep Apnea (327.23 [G47.33 ICD-10])  RECOMMENDATIONS - Therapeutic CPAP titration to determine optimal pressure required to alleviate sleep disordered breathing. - Positional therapy avoiding supine position during sleep. - Very mild obstructive sleep apnea. Return to discuss treatment options. - Avoid alcohol, sedatives and other CNS depressants that may worsen sleep apnea and disrupt normal sleep architecture. - Sleep hygiene should be reviewed to assess factors that may improve sleep quality. - Weight management and regular exercise should be initiated or continued if appropriate.   Sueanne Margarita Diplomate, American Board of Sleep Medicine  ELECTRONICALLY SIGNED ON:  06/12/2015, 10:03 PM [place] PH: [phone]   FX: [phone] [place]

## 2015-06-12 NOTE — Telephone Encounter (Signed)
Please let patient know that they have sleep apnea and recommend CPAP titration. Please set up titration in the sleep lab. 

## 2015-06-16 ENCOUNTER — Other Ambulatory Visit: Payer: Self-pay | Admitting: *Deleted

## 2015-06-16 ENCOUNTER — Telehealth: Payer: Self-pay | Admitting: *Deleted

## 2015-06-16 DIAGNOSIS — G4733 Obstructive sleep apnea (adult) (pediatric): Secondary | ICD-10-CM

## 2015-06-16 NOTE — Telephone Encounter (Signed)
Left message for patient to call back  

## 2015-06-16 NOTE — Telephone Encounter (Signed)
Left a message to call back.

## 2015-06-16 NOTE — Telephone Encounter (Signed)
Patient informed of information.  Stated verbal understanding.      Patient is ready to proceed with Titration. Will call over to the sleep lab to schedule.  Patient will receive a letter in the mail letting her know when that is scheduled.

## 2015-06-16 NOTE — Telephone Encounter (Signed)
Patient is traveling in April and she is wanting to make sure that from a cardiac standpoint that she will be okay to fly and travel.  Will route to the nurse to call her to discuss.

## 2015-06-19 ENCOUNTER — Encounter: Payer: Self-pay | Admitting: *Deleted

## 2015-07-07 ENCOUNTER — Other Ambulatory Visit: Payer: Self-pay | Admitting: Internal Medicine

## 2015-08-03 ENCOUNTER — Ambulatory Visit (HOSPITAL_BASED_OUTPATIENT_CLINIC_OR_DEPARTMENT_OTHER): Payer: BC Managed Care – PPO | Attending: Cardiology | Admitting: Cardiology

## 2015-08-03 VITALS — Ht 63.0 in | Wt 198.0 lb

## 2015-08-03 DIAGNOSIS — G4733 Obstructive sleep apnea (adult) (pediatric): Secondary | ICD-10-CM | POA: Diagnosis not present

## 2015-08-03 HISTORY — PX: POLYSOMNOGRAPHY 4 OR MORE PARAMETERS: SLE3

## 2015-08-05 NOTE — Progress Notes (Deleted)
Subjective:     Patient ID: Katrina Carrillo, female   DOB: 06/03/1965, 50 y.o.   MRN: [id number]  HPI   Review of Systems     Objective:   Physical Exam     Assessment:     ***    Plan:     ***

## 2015-08-11 ENCOUNTER — Telehealth: Payer: Self-pay | Admitting: Internal Medicine

## 2015-08-11 NOTE — Telephone Encounter (Signed)
Left message for patient to call back on Monday AM

## 2015-08-11 NOTE — Telephone Encounter (Signed)
New MEssage  Pt c/o indigestion- pt stated "she does not feel 100%"- wanted to be seen today. Please call back and discuss.

## 2015-08-11 NOTE — Telephone Encounter (Signed)
Pt also is having right knee pain and was afraid this may be a blood clot.  The pain is isolated to her knee, "inside".  It started hurting on Sunday. She traveled from [place] on April 16.  I adv a blood clot will more likely be painful at other parts of her leg, not isolated to her knee.  She verbalizes understanding.

## 2015-08-11 NOTE — Telephone Encounter (Signed)
Called patient back. She has been belching a lot last several days Tried some ginger ale this afternoon.  Thinks it helped some. Denies nausea, or sweating with this.  Has not been ill recently. Was in [place] last month and has not gotten back to her usual diet of "juicing" since.  She is concerned that she may be retaining fluid that she does not know about. Advised she would become SOB, fatigued, and her weight would increase.  She thinks her weight has been between 201-204 which is stable for her. I suggested she could try OTC antacids, she is going to get back to her usual diet and see if this helps.  She is due to see Dr. Harrington Challenger at the end of July but is going out of town then and hopes to come in sooner. I will call her if a sooner appointment becomes available.

## 2015-08-11 NOTE — Telephone Encounter (Signed)
Have her come in for labs on Monday I can see her while getting done

## 2015-08-12 ENCOUNTER — Telehealth: Payer: Self-pay | Admitting: Cardiology

## 2015-08-12 ENCOUNTER — Encounter (HOSPITAL_BASED_OUTPATIENT_CLINIC_OR_DEPARTMENT_OTHER): Payer: Self-pay | Admitting: Cardiology

## 2015-08-12 NOTE — Procedures (Deleted)
   Patient Name: Katrina Carrillo, Katrina Carrillo MRN: [id number] Study Date: 08/03/2015 Gender: Female D.O.B: 04/02/1936 Age (years): 79 Referring Provider: Steven Klein Interpreting Physician: Nikkol Pai MD, ABSM RPSGT: Dubili, Fred  BMI: 32 Height (inches): 70 Neck Size: 20.00 Weight (lbs): 222  CLINICAL INFORMATION Sleep Study Type: NPSG  Indication for sleep study: Fatigue, Restless Sleep with Limb Movments, Snoring, Witnesses Apnea / Gasping During Sleep  SLEEP STUDY TECHNIQUE As per the AASM Manual for the Scoring of Sleep and Associated Events v2.3 (April 2016) with a hypopnea requiring 4% desaturations. The channels recorded and monitored were frontal, central and occipital EEG, electrooculogram (EOG), submentalis EMG (chin), nasal and oral airflow, thoracic and abdominal wall motion, anterior tibialis EMG, snore microphone, electrocardiogram, and pulse oximetry.  MEDICATIONS Patient's medications include: Reviewed in the chart. Medications self-administered by patient during sleep study : Sleep medicine administered - Ambien 10 mg at 09:30:41 PM  SLEEP ARCHITECTURE The study was initiated at 10:56:40 PM and ended at 4:58:56 AM. Sleep onset time was 2.4 minutes and the sleep efficiency was reduced at 58.5%. The total sleep time was 212.0 minutes. Stage REM latency was prolonged at 267.0 minutes. The patient spent 19.81% of the night in stage N1 sleep, 70.52% in stage N2 sleep, 0.00% in stage N3 and 9.67% in REM. Alpha intrusion was absent. Supine sleep was 34.72%.  RESPIRATORY PARAMETERS The overall apnea/hypopnea index (AHI) was 51.5 per hour. There were 92 total apneas, including 84 obstructive, 8 central and 0 mixed apneas. There were 90 hypopneas and 0 RERAs. The AHI during Stage REM sleep was 52.7 per hour. AHI while supine was 89.7 per hour. The mean oxygen saturation was 91.94%. The minimum SpO2 during sleep was 76.00%. Loud  snoring was noted during this study.  CARDIAC DATA The 2 lead EKG demonstrated sinus rhythm. The mean heart rate was 52.91 beats per minute. Other EKG findings include: PVCs  LEG MOVEMENT DATA The total PLMS were 168 with a resulting PLMS index of 47.55. Associated arousal with leg movement index was 1.1 .  IMPRESSIONS - Severe obstructive sleep apnea occurred during this study (AHI = 51.5/h). - No significant central sleep apnea occurred during this study (CAI = 2.3/h). - Moderate oxygen desaturation was noted during this study (Min O2 = 76.00%). - The patient snored with Loud snoring volume. - PVCs were noted during this study. 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 No significant associated arousals.  DIAGNOSIS - Obstructive Sleep Apnea (327.23 [G47.33 ICD-10]) - Nocturnal Hypoxemia (327.26 [G47.36 ICD-10])  RECOMMENDATIONS - Therapeutic CPAP titration to determine optimal pressure required to alleviate sleep disordered breathing. - Positional therapy avoiding supine position during sleep. - Avoid alcohol, sedatives and other CNS depressants that may worsen sleep apnea and disrupt normal sleep architecture. - Sleep hygiene should be reviewed to assess factors that may improve sleep quality. - Weight management and regular exercise should be initiated or continued if appropriate.   [name] Diplomate, American Board of Sleep Medicine  ELECTRONICALLY SIGNED ON: 08/12/2015, 7:07 PM [facility] PH: [phone] FX: [phone] ACCREDITED BY THE AMERICAN ACADEMY OF SLEEP MEDICINE           

## 2015-08-12 NOTE — Procedures (Signed)
   Patient Name: Katrina Carrillo, Katrina Carrillo Date: 08/03/2015 MRN: [id number] Gender: Female D.O.B: 1965-11-16 Age (years): 84 Referring Provider: Dorris Carnes Interpreting Physician: Fransico Him MD, ABSM RPSGT: Baxter Flattery  Weight (lbs): 198 BMI: 35 Height (inches): 63 Neck Size: 12.50  CLINICAL INFORMATION The patient is referred for a CPAP titration to treat sleep apnea.  SLEEP STUDY TECHNIQUE As per the AASM Manual for the Scoring of Sleep and Associated Events v2.3 (April 2016) with a hypopnea requiring 4% desaturations. The channels recorded and monitored were frontal, central and occipital EEG, electrooculogram (EOG), submentalis EMG (chin), nasal and oral airflow, thoracic and abdominal wall motion, anterior tibialis EMG, snore microphone, electrocardiogram, and pulse oximetry. Continuous positive airway pressure (CPAP) was initiated at the beginning of the study and titrated to treat sleep-disordered breathing.  MEDICATIONS Medications taken by the patient : Reviewed in the chart Medications administered by patient during sleep study : No sleep medicine administered.  TECHNICIAN COMMENTS Comments added by technician: Patient tolerated the cpap mask good.  Comments added by scorer: N/A  RESPIRATORY PARAMETERS Optimal PAP Pressure (cm): 12  AHI at Optimal Pressure (/hr):1.9 Overall Minimal O2 (%):87.00 Supine % at Optimal Pressure (%):0 Minimal O2 at Optimal Pressure (%):92.0    SLEEP ARCHITECTURE The study was initiated at 11:01:13 PM and ended at 5:23:45 AM. Sleep onset time was 12.0 minutes and the sleep efficiency was 91.0%. The total sleep time was 348.0 minutes. The patient spent 2.16% of the night in stage N1 sleep, 37.07% in stage N2 sleep, 44.83% in stage N3 and 15.95% in REM.Stage REM latency was 65.5 minutes Wake after sleep onset was 22.5. Alpha intrusion was absent. Supine sleep was 48.68%.  CARDIAC DATA The 2 lead EKG demonstrated sinus rhythm. The  mean heart rate was 74.91 beats per minute. Other EKG findings include: None.  LEG MOVEMENT DATA The total Periodic Limb Movements of Sleep (PLMS) were 16. The PLMS index was 2.76. A PLMS index of <15 is considered normal in adults.  IMPRESSIONS - The optimal PAP pressure was 12 cm of water. - Central sleep apnea was not noted during this titration (CAI = 0.0/h). - Mild oxygen desaturations were observed during this titration (min O2 = 87.00%). - No snoring was audible during this study. - No cardiac abnormalities were observed during this study. - Clinically significant periodic limb movements were not noted during this study. Arousals associated with PLMs were rare.  DIAGNOSIS - Obstructive Sleep Apnea (327.23 [G47.33 ICD-10])  RECOMMENDATIONS - Trial of CPAP therapy on 12 cm H2O with a Small size Resmed Full Face Mask Quattro FX mask and heated humidification. - Avoid alcohol, sedatives and other CNS depressants that may worsen sleep apnea and disrupt normal sleep architecture. - Sleep hygiene should be reviewed to assess factors that may improve sleep quality. - Weight management and regular exercise should be initiated or continued. - Return to Sleep Center for re-evaluation after 10 weeks of therapy  Nunez, [facility] of Sleep Medicine  ELECTRONICALLY SIGNED ON:  08/12/2015, 7:56 PM [place] PH: [phone]   FX: [phone] [place]

## 2015-08-12 NOTE — Telephone Encounter (Signed)
Please disregard prior note on sleep study results.   Pt had successful PAP titration. Please setup appointment in 10 weeks. Please let [facility] know that order for PAP is in EPIC.

## 2015-08-12 NOTE — Telephone Encounter (Signed)
Please let patient know that they have sleep apnea and recommend CPAP titration. Please set up titration in the sleep lab. 

## 2015-08-12 NOTE — Addendum Note (Signed)
Addended by: Fransico Him R on: 08/12/2015 08:04 PM   Modules accepted: Orders

## 2015-08-14 ENCOUNTER — Telehealth: Payer: Self-pay | Admitting: Internal Medicine

## 2015-08-14 DIAGNOSIS — M79661 Pain in right lower leg: Secondary | ICD-10-CM

## 2015-08-14 NOTE — Telephone Encounter (Signed)
Mrs. Katrina Carrillo is returning your call from 05-12/17..  Thanks

## 2015-08-14 NOTE — Telephone Encounter (Signed)
Attempted to contact patient, VM was full so I could not leave a message. [place] notified of orders.  Once patient has started CPAP Therapy, I will schedule 10 week follow-up.

## 2015-08-15 ENCOUNTER — Other Ambulatory Visit (INDEPENDENT_AMBULATORY_CARE_PROVIDER_SITE_OTHER): Payer: BC Managed Care – PPO | Admitting: *Deleted

## 2015-08-15 DIAGNOSIS — M79661 Pain in right lower leg: Secondary | ICD-10-CM

## 2015-08-15 LAB — CBC
HEMATOCRIT: 38 % (ref 35.0–45.0)
HEMOGLOBIN: 12.3 g/dL (ref 11.7–15.5)
MCH: 25.8 pg — AB (ref 27.0–33.0)
MCHC: 32.4 g/dL (ref 32.0–36.0)
MCV: 79.8 fL — ABNORMAL LOW (ref 80.0–100.0)
MPV: 10.5 fL (ref 7.5–12.5)
Platelets: 278 10*3/uL (ref 140–400)
RBC: 4.76 MIL/uL (ref 3.80–5.10)
RDW: 14.7 % (ref 11.0–15.0)
WBC: 11.3 10*3/uL — ABNORMAL HIGH (ref 3.8–10.8)

## 2015-08-15 LAB — BASIC METABOLIC PANEL
BUN: 18 mg/dL (ref 7–25)
CALCIUM: 8.8 mg/dL (ref 8.6–10.4)
CO2: 25 mmol/L (ref 20–31)
Chloride: 105 mmol/L (ref 98–110)
Creat: 0.86 mg/dL (ref 0.50–1.05)
GLUCOSE: 89 mg/dL (ref 65–99)
Potassium: 4.1 mmol/L (ref 3.5–5.3)
SODIUM: 139 mmol/L (ref 135–146)

## 2015-08-15 LAB — D-DIMER, QUANTITATIVE: D-Dimer, Quant: 0.37 ug/mL-FEU (ref 0.00–0.48)

## 2015-08-15 NOTE — Telephone Encounter (Signed)
Follow up  Patient waiting on the nurse to call her back regarding setting up labs.

## 2015-08-15 NOTE — Telephone Encounter (Signed)
Called patient. She got the message to come for lab work but states she could come today for blood work. Her indigestion has improved--she started using OTC Nexium.  Her knee is tight and tender and pain is in calf as well.   She is concerned that she may have a clot.  Spoke with Dr. Marlou Porch, (DOD). BLE doppler ordered.  She will come today for labs. (D Dimer, CBC, BMET)

## 2015-08-15 NOTE — Telephone Encounter (Signed)
Doppler 08/16/15 at 8:00 am.

## 2015-08-16 ENCOUNTER — Ambulatory Visit (HOSPITAL_COMMUNITY)
Admission: RE | Admit: 2015-08-16 | Discharge: 2015-08-16 | Disposition: A | Discharge status: Home / Self Care | Payer: BC Managed Care – PPO | Source: Ambulatory Visit | Attending: Cardiovascular Disease | Admitting: Cardiovascular Disease

## 2015-08-16 DIAGNOSIS — I10 Essential (primary) hypertension: Secondary | ICD-10-CM | POA: Insufficient documentation

## 2015-08-16 DIAGNOSIS — M79661 Pain in right lower leg: Secondary | ICD-10-CM

## 2015-08-18 ENCOUNTER — Other Ambulatory Visit: Payer: Self-pay | Admitting: Orthopedic Surgery

## 2015-08-18 DIAGNOSIS — M25561 Pain in right knee: Secondary | ICD-10-CM

## 2015-08-25 ENCOUNTER — Ambulatory Visit
Admission: RE | Admit: 2015-08-25 | Discharge: 2015-08-25 | Disposition: A | Discharge status: Home / Self Care | Payer: BC Managed Care – PPO | Source: Ambulatory Visit | Attending: Orthopedic Surgery | Admitting: Orthopedic Surgery

## 2015-08-25 DIAGNOSIS — M25561 Pain in right knee: Secondary | ICD-10-CM

## 2015-09-13 ENCOUNTER — Encounter (HOSPITAL_BASED_OUTPATIENT_CLINIC_OR_DEPARTMENT_OTHER): Payer: Self-pay | Admitting: *Deleted

## 2015-09-14 NOTE — H&P (Signed)
This is a pleasant 50 year-old female who presents to our clinic today with right knee pain.  She states this began about a week ago with no known injury or change in activity.  Of note, she does state that she went to [place] about a month ago and did a lot of walking.  Over the past few days her pain has improved just a bit.  All of her pain is posteromedial.  She describe sharp shooting pains in the medial aspect with occasional locking and catching with pivoting and squatting.  She does note quite a bit of startup stiffness, as well as limping.  She has tried Biofreeze with minimal relief of symptoms.  Some numbness to the big toe, otherwise neurovascularly intact distally.  She did see her primary care physician soon after return from [place] due to worsening pain to the right calf.  Doppler ultrasound was negative for DVT.  No history of right knee surgical intervention or Cortisone injection in the past. Another issue she brings up today is left lower back pain.  This occasionally radiates down the back of her leg and catches at the top of her pelvis.  She describes this as intermittent which started about one year ago after falling on ice.  No radicular symptoms to the left foot.   Past medical history: Significant for congestive heart failure and hypertension. Allergies: Iodine.  Current medications: Lasix, Hydralazine, potassium, Enalapril and Carvedilol.  Family history: Significant for diabetes, hypertension, heart disease and arthritis.   Social history: Does not smoke or drink.  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      EXAMINATION: Well-developed, well-nourished female in no acute distress.  Alert and oriented x 3.  Height: 5?3.  Weight: 196 pounds.  Blood pressure: 116/66.  Pulse: 80.   Lungs clear to auscultation bilaterally.  Heart sounds normal.  Examination of her right knee reveals no effusion.  Range of motion 0-100 degrees.  Marked medial joint line tenderness just over the  medial meniscus.  Positive medial McMurray.  Minimal patellofemoral crepitus.  Ligaments are stable.  Increased Q angle with tracking.  No tethering.  She is neurovascularly intact distally.   Examination of her back reveals minimal paraspinous tenderness to palpation of the lumbar spine.  Pain with extension.  Tenderness over the SI joint on the left.   X-RAYS: X-rays of her knee reveal 50% medial joint line narrowing.  Minimal DJD of the patellofemoral and lateral compartments.   X-rays of her lumbar spine reveal no actual curvature of the lumbar spine.  Anterior spurring L3-4.  IMPRESSION: 1. Right knee degenerative medial meniscus tear. 2. 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   PLAN: In regards to her knee, we are going to proceed with an MRI to assess her medial meniscus.  We are going to go ahead and fill out paperwork to proceed with right knee arthroscopic debridement of medial meniscus, but we will be in touch over the phone once her MRI is completed.  Risks, benefits and possible complications of surgery have been reviewed.  Rehab and recovery time discussed. All questions answered.  Paperwork complete.    [name], M.D.  Addendum: MRI right knee reveals medial meniscus tear.  We will proceed with the above plan and patient agrees.

## 2015-09-15 ENCOUNTER — Other Ambulatory Visit: Payer: Self-pay | Admitting: Physician Assistant

## 2015-09-21 ENCOUNTER — Ambulatory Visit (HOSPITAL_BASED_OUTPATIENT_CLINIC_OR_DEPARTMENT_OTHER)
Admission: RE | Admit: 2015-09-21 | Payer: BC Managed Care – PPO | Source: Ambulatory Visit | Admitting: Orthopedic Surgery

## 2015-09-21 ENCOUNTER — Encounter (HOSPITAL_BASED_OUTPATIENT_CLINIC_OR_DEPARTMENT_OTHER): Payer: Self-pay | Admitting: Anesthesiology

## 2015-09-21 HISTORY — DX: Heart failure, unspecified: I50.9

## 2015-09-21 SURGERY — ARTHROSCOPY, KNEE, WITH MEDIAL MENISCECTOMY
Anesthesia: General | Site: Knee | Laterality: Right

## 2015-09-21 MED ORDER — METHYLPREDNISOLONE ACETATE 40 MG/ML IJ SUSP
INTRAMUSCULAR | Status: AC
  Filled 2015-09-21: qty 1

## 2015-09-21 MED ORDER — METHYLPREDNISOLONE ACETATE 80 MG/ML IJ SUSP
INTRAMUSCULAR | Status: AC
  Filled 2015-09-21: qty 1

## 2015-09-21 MED ORDER — BUPIVACAINE HCL (PF) 0.25 % IJ SOLN
INTRAMUSCULAR | Status: AC
  Filled 2015-09-21: qty 60

## 2015-09-21 MED ORDER — BETAMETHASONE SOD PHOS & ACET 6 (3-3) MG/ML IJ SUSP
INTRAMUSCULAR | Status: AC
  Filled 2015-09-21: qty 1

## 2015-09-21 MED ORDER — BUPIVACAINE HCL (PF) 0.5 % IJ SOLN
INTRAMUSCULAR | Status: AC
  Filled 2015-09-21: qty 120

## 2015-09-21 NOTE — Interval H&P Note (Signed)
History and Physical Interval Note:  09/21/2015 7:34 AM  Katrina Carrillo  has presented today for surgery, with the diagnosis of CHONDROMALACIA PATELLAE RIGHT KNEE OTHER TEAR OF MEDIAL MENISCUS CURRENT INJURY UNSPECIFIED KNEE INITIAL ENCOUNTER RIGHT KNEE   The various methods of treatment have been discussed with the patient and family. After consideration of risks, benefits and other options for treatment, the patient has consented to  Procedure(s): RIGHT KNEE ARTHROSCOPY CHONDROPLASTY WITH MEDIAL MENISCECTOMY (Right) as a surgical intervention .  The patient's history has been reviewed, patient examined, no change in status, stable for surgery.  I have reviewed the patient's chart and labs.  Questions were answered to the patient's satisfaction.     [name]

## 2015-10-24 ENCOUNTER — Encounter: Payer: Self-pay | Admitting: Cardiology

## 2015-11-24 ENCOUNTER — Encounter: Payer: Self-pay | Admitting: Cardiology

## 2015-11-30 NOTE — H&P (Signed)
This is a pleasant 50 year-old female who presents to our clinic today with right knee pain.  She states this began about a week ago with no known injury or change in activity.  Of note, she does state that she went to [place] about a month ago and did a lot of walking.  Over the past few days her pain has improved just a bit.  All of her pain is posteromedial.  She describe sharp shooting pains in the medial aspect with occasional locking and catching with pivoting and squatting.  She does note quite a bit of startup stiffness, as well as limping.  She has tried Biofreeze with minimal relief of symptoms.  Some numbness to the big toe, otherwise neurovascularly intact distally.  She did see her primary care physician soon after return from [place] due to worsening pain to the right calf.  Doppler ultrasound was negative for DVT.  No history of right knee surgical intervention or Cortisone injection in the past. Another issue she brings up today is left lower back pain.  This occasionally radiates down the back of her leg and catches at the top of her pelvis.  She describes this as intermittent which started about one year ago after falling on ice.  No radicular symptoms to the left foot.   Past medical history: Significant for congestive heart failure and hypertension. Allergies: Iodine.  Current medications: Lasix, Hydralazine, potassium, Enalapril and Carvedilol.  Family history: Significant for diabetes, hypertension, heart disease and arthritis.   Social history: Does not smoke or drink.  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      EXAMINATION: Well-developed, well-nourished female in no acute distress.  Alert and oriented x 3.  Height: 5?3.  Weight: 196 pounds.  Blood pressure: 116/66.  Pulse: 80.   Examination of her right knee reveals no effusion.  Range of motion 0-100 degrees.  Marked medial joint line tenderness just over the medial meniscus.  Positive medial McMurray.  Minimal patellofemoral  crepitus.  Ligaments are stable.  Increased Q angle with tracking.  No tethering.  She is neurovascularly intact distally.   Examination of her back reveals minimal paraspinous tenderness to palpation of the lumbar spine.  Pain with extension.  Tenderness over the SI joint on the left.   X-RAYS: X-rays of her knee reveal 50% medial joint line narrowing.  Minimal DJD of the patellofemoral and lateral compartments.   X-rays of her lumbar spine reveal no actual curvature of the lumbar spine.  Anterior spurring L3-4.  IMPRESSION: 1. Right knee degenerative medial meniscus tear. 2. 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   PLAN: In regards to her knee, we are going to proceed with an MRI to assess her medial meniscus.  We are going to go ahead and fill out paperwork to proceed with right knee arthroscopic debridement of medial meniscus, but we will be in touch over the phone once her MRI is completed.  Risks, benefits and possible complications of surgery have been reviewed.  Rehab and recovery time discussed. All questions answered.  Paperwork complete.    Addendum:  This is a pleasant 50 year-old female who presents to our clinic today with concerns about her upcoming right knee surgery.  Irie has had a recent MRI of her right knee which showed Grade II MCL sprain, as well as a medial meniscus tear and moderate changes in the medial compartment.  This all started after a trip to [place].  No specific injury,  just a lot of walking and activity.  Her pain has gotten a little better, but she is still unable to exercise as she would like.  She has researched knee arthroscopy, as well as stem cell injections, and has decided to go ahead and proceed with right knee arthroscopic debridement.    PLAN:  Today I spent 25 minutes discussing and answering all of Stellar's questions in regards to her right knee arthroscopy.  Because this pain is limiting her day to day activities she has elected to proceed with right  knee arthroscopic debridement.  Risks, benefits and possible complications of surgery have been reviewed.  Rehab and recovery time discussed.  All questions answered.  Paperwork complete.  We will see Rhaegan at the time of operative intervention.

## 2015-12-07 ENCOUNTER — Encounter (HOSPITAL_BASED_OUTPATIENT_CLINIC_OR_DEPARTMENT_OTHER): Admission: RE | Payer: Self-pay | Source: Ambulatory Visit

## 2015-12-07 ENCOUNTER — Ambulatory Visit (HOSPITAL_BASED_OUTPATIENT_CLINIC_OR_DEPARTMENT_OTHER)
Admission: RE | Admit: 2015-12-07 | Payer: BC Managed Care – PPO | Source: Ambulatory Visit | Admitting: Orthopedic Surgery

## 2015-12-07 ENCOUNTER — Encounter: Payer: Self-pay | Admitting: Cardiology

## 2015-12-07 DIAGNOSIS — E669 Obesity, unspecified: Secondary | ICD-10-CM

## 2015-12-07 HISTORY — DX: Obesity, unspecified: E66.9

## 2015-12-07 SURGERY — ARTHROSCOPY, KNEE, WITH MEDIAL MENISCECTOMY
Anesthesia: General | Laterality: Right

## 2015-12-07 NOTE — Progress Notes (Signed)
      ROS  This encounter was created in error - please disregard. 

## 2015-12-08 ENCOUNTER — Encounter: Payer: BC Managed Care – PPO | Admitting: Cardiology

## 2015-12-14 ENCOUNTER — Encounter: Payer: Self-pay | Admitting: Cardiology

## 2016-01-31 ENCOUNTER — Encounter (INDEPENDENT_AMBULATORY_CARE_PROVIDER_SITE_OTHER): Payer: BC Managed Care – PPO | Admitting: Cardiology

## 2016-01-31 NOTE — Progress Notes (Signed)
  ROS  This encounter was created in error - please disregard. 

## 2016-02-05 ENCOUNTER — Encounter: Payer: Self-pay | Admitting: Cardiology

## 2016-02-27 ENCOUNTER — Encounter: Payer: Self-pay | Admitting: Internal Medicine

## 2016-03-03 ENCOUNTER — Other Ambulatory Visit: Payer: Self-pay | Admitting: Physician Assistant

## 2016-03-04 ENCOUNTER — Ambulatory Visit (INDEPENDENT_AMBULATORY_CARE_PROVIDER_SITE_OTHER): Payer: BC Managed Care – PPO | Admitting: Internal Medicine

## 2016-03-04 ENCOUNTER — Encounter: Payer: Self-pay | Admitting: Internal Medicine

## 2016-03-04 VITALS — BP 124/68 | HR 78 | Ht 63.0 in | Wt 197.8 lb

## 2016-03-04 DIAGNOSIS — I5022 Chronic systolic (congestive) heart failure: Secondary | ICD-10-CM | POA: Diagnosis not present

## 2016-03-04 LAB — BASIC METABOLIC PANEL
BUN: 11 mg/dL (ref 7–25)
CALCIUM: 9.5 mg/dL (ref 8.6–10.4)
CHLORIDE: 101 mmol/L (ref 98–110)
CO2: 33 mmol/L — AB (ref 20–31)
CREATININE: 0.67 mg/dL (ref 0.50–1.05)
GLUCOSE: 97 mg/dL (ref 65–99)
POTASSIUM: 3.9 mmol/L (ref 3.5–5.3)
SODIUM: 138 mmol/L (ref 135–146)

## 2016-03-04 MED ORDER — POTASSIUM CHLORIDE CRYS ER 20 MEQ PO TBCR: 40.0000 meq | EXTENDED_RELEASE_TABLET | Freq: Two times a day (BID) | ORAL | 11 refills | Status: DC

## 2016-03-04 MED ORDER — FUROSEMIDE 40 MG PO TABS: 40.0000 mg | ORAL_TABLET | Freq: Two times a day (BID) | ORAL | 3 refills | Status: DC

## 2016-03-04 MED ORDER — ISOSORBIDE MONONITRATE ER 30 MG PO TB24: 30.0000 mg | ORAL_TABLET | Freq: Every day | ORAL | 3 refills | Status: DC

## 2016-03-04 MED ORDER — CARVEDILOL 6.25 MG PO TABS: 12.5000 mg | ORAL_TABLET | Freq: Two times a day (BID) | ORAL | 11 refills | Status: DC

## 2016-03-04 MED ORDER — ENALAPRIL MALEATE 5 MG PO TABS: 5.0000 mg | ORAL_TABLET | Freq: Two times a day (BID) | ORAL | 3 refills | Status: DC

## 2016-03-04 NOTE — Progress Notes (Signed)
Cardiology Office Note   Date:  03/04/2016   ID:  [name], DOB 1965/09/23, MRN [id number]  PCP:  Reginia Naas, MD  Cardiologist:   Dorris Carnes, MD   F/U of mild LV dysfunction and HTN      History of Present Illness: Katrina Carrillo is a 50 y.o. female with a history of NICM, HTN.  Normal coronary arteries at cath in 2010  LVEF 15%  Last echo 40 to 45% Also a history of HTN  I saw her in Dec 2016 She says she is feeling good  Breathing is good  No CP  No edema  No dizziness       Outpatient Medications Prior to Visit  Medication Sig Dispense Refill  . carvedilol (COREG) 6.25 MG tablet Take 2 tablets (12.5 mg total) by mouth every 12 (twelve) hours. 120 tablet 0  . cetirizine (ZYRTEC) 10 MG tablet Take 10 mg by mouth daily.    . enalapril (VASOTEC) 5 MG tablet take 1 tablet by mouth twice a day 180 tablet 2  . furosemide (LASIX) 40 MG tablet Take 1 tablet (40 mg total) by mouth 2 (two) times daily. 60 tablet 11  . hydrALAZINE (APRESOLINE) 25 MG tablet Take 1 tablet (25 mg total) by mouth 2 (two) times daily. 180 tablet 3  . isosorbide mononitrate (IMDUR) 30 MG 24 hr tablet Take 1 tablet (30 mg total) by mouth daily. 90 tablet 3  . Multiple Vitamin (MULTIVITAMIN) tablet Take 1 tablet by mouth daily.      Marland Kitchen NASONEX 50 MCG/ACT nasal spray Place 2 sprays into both nostrils daily.   0  . potassium chloride SA (K-DUR,KLOR-CON) 20 MEQ tablet Take 2 tablets (40 mEq total) by mouth 2 (two) times daily. For additional refills please call the office. [phone]. 120 tablet 0  . QVAR 80 MCG/ACT inhaler Inhale 2 puffs into the lungs 2 (two) times daily.  0   No facility-administered medications prior to visit.      Allergies:   Peanut-containing drug products; Shellfish allergy; and Iodine   Past Medical History:  Diagnosis Date  . CHF (congestive heart failure) (HCC)    cardiomyopathy  . Diverticulosis   . Family history of malignant neoplasm of  gastrointestinal tract 02/17/2012   Father with colon cancer age 43   . GASTROESOPHAGEAL REFLUX DISEASE 02/22/2009   Qualifier: Diagnosis of  By: Ronnald Ramp, CNA, Deborah    . GERD (gastroesophageal reflux disease)   . Hypertension   . Nonischemic cardiomyopathy ([place])    [place] 01/2009: Normal coronary arteries, EF 15%. Last echo 09/2010: EF 123456, grade 2 diastolic dysfunction, trivial MR, mild LAE.8/14 EF 40-45%   . Obesity (BMI 30-39.9) 12/07/2015  . OSA (obstructive sleep apnea) 06/12/2015   Mild OSA with total AHI 8/hr and in REM sleep 26/hr with oxygen desaturations as low as 70% with respiratory events.    . OSA (obstructive sleep apnea)    use CPAP nightly  . Seasonal allergies     Past Surgical History:  Procedure Laterality Date  . POLYSOMNOGRAPHY 4 OR MORE PARAMETERS  08/03/2015  . TUBAL LIGATION     S/P     Social History:  The patient  reports that she quit smoking about 27 years ago. Her smoking use included Cigarettes. She quit after 2.00 years of use. She has never used smokeless tobacco. She reports that she drinks alcohol. She reports that she does not use drugs.   Family  History:  The patient's family history includes Cancer in her father and paternal grandmother; Colon cancer (age of onset: 79) in her father; Coronary artery disease in her father; Diabetes in her maternal grandmother and mother; Hypertension in her other; Mental retardation in her son.    ROS:  Please see the history of present illness. All other systems are reviewed and  Negative to the above problem except as noted.    PHYSICAL EXAM: VS:  BP 124/68   Pulse 78   Ht 5\' 3"  (1.6 m)   Wt 197 lb 12.8 oz (89.7 kg)   BMI 35.04 kg/m   GEN: Well nourished, well developed, in no acute distress  HEENT: normal  Neck: no JVD, carotid bruits, or masses Cardiac: RRR; no murmurs, rubs, or gallops,no edema  Respiratory:  clear to auscultation bilaterally, normal work of breathing GI: soft, nontender, nondistended,  + BS  No hepatomegaly  MS: no deformity Moving all extremities   Skin: warm and dry, no rash Neuro:  Strength and sensation are intact Psych: euthymic mood, full affect   EKG:  EKG is ordered today.SR 78 bpm  Nonspecific ST changes     Lipid Panel    Component Value Date/Time   CHOL 186 03/13/2015 0915   TRIG 61 03/13/2015 0915   HDL 59 03/13/2015 0915   CHOLHDL 3.2 03/13/2015 0915   VLDL 12 03/13/2015 0915   LDLCALC 115 03/13/2015 0915   LDLDIRECT 147.2 03/04/2013 1004      Wt Readings from Last 3 Encounters:  03/04/16 197 lb 12.8 oz (89.7 kg)  08/03/15 198 lb (89.8 kg)  06/11/15 198 lb (89.8 kg)      ASSESSMENT AND PLAN:  1  Chronic systolic CHF  Volume status is good  No changes to recomm    2  HTN  BP is good    Encouraged her to stay active   Will check BMET today     F/U in 1 year     Current medicines are reviewed at length with the patient today.  The patient does not have concerns regarding medicines.  Signed, Dorris Carnes, MD  03/04/2016 4:30 PM    [address], [place], [place]  [address] Phone: [phone]; Fax: [phone]

## 2016-03-04 NOTE — Patient Instructions (Signed)
Your physician recommends that you continue on your current medications as directed. Please refer to the Current Medication list given to you today. Your physician recommends that you return for lab work in: today Artist) Your physician wants you to follow-up in: 1 year with Dr. Harrington Challenger.  You will receive a reminder letter in the mail two months in advance. If you don't receive a letter, please call our office to schedule the follow-up appointment.

## 2016-04-09 ENCOUNTER — Encounter: Payer: Self-pay | Admitting: Cardiology

## 2016-04-11 ENCOUNTER — Encounter: Payer: Self-pay | Admitting: Cardiology

## 2016-04-11 ENCOUNTER — Ambulatory Visit: Payer: BC Managed Care – PPO | Admitting: Cardiology

## 2016-04-11 ENCOUNTER — Ambulatory Visit (INDEPENDENT_AMBULATORY_CARE_PROVIDER_SITE_OTHER): Payer: BC Managed Care – PPO | Admitting: Cardiology

## 2016-04-11 VITALS — BP 90/64 | HR 80 | Ht 63.0 in | Wt 200.4 lb

## 2016-04-11 DIAGNOSIS — E669 Obesity, unspecified: Secondary | ICD-10-CM

## 2016-04-11 DIAGNOSIS — G4733 Obstructive sleep apnea (adult) (pediatric): Secondary | ICD-10-CM | POA: Diagnosis not present

## 2016-04-11 DIAGNOSIS — I10 Essential (primary) hypertension: Secondary | ICD-10-CM

## 2016-04-11 NOTE — Patient Instructions (Signed)

## 2016-04-11 NOTE — Progress Notes (Signed)
Cardiology Office Note    Date:  04/11/2016   ID:  Lenisha Kobes, DOB 1965/10/17, MRN [id number]  PCP:  Reginia Naas, MD  Cardiologist:  Fransico Him, MD   No chief complaint on file.   History of Present Illness:  Katrina Carrillo is a 51 y.o. female with a history of OSA on CPAP who present today for evaluation of OSA. Dr. Harrington Challenger ordered a PSG because she complained of waking up a lot at night and had excessive daytime sleepiness.  She also had problems with snoring.   She underwent PSG which showed severe OSA with an AHI of 51/hr and underwent CPAP titration to 12cm H2O.  She is tolerating her CPAP device.  She is doing well with the nasal mask and feels the pressure is adequate.  Since starting CPAP, she feels more rested in the am and has less daytime sleepiness when she uses it.  She does know if she is continuing to snore when she uses it.  She has  mouth dryness in the am and breaths out her mouth some but no significant nasal congestion. She had a cold back in November and had problems tolerating her CPAP and so has not been using it much.  She is now back on it some.      Past Medical History:  Diagnosis Date  . CHF (congestive heart failure) (HCC)    cardiomyopathy  . Diverticulosis   . Family history of malignant neoplasm of gastrointestinal tract 02/17/2012   Father with colon cancer age 46   . GASTROESOPHAGEAL REFLUX DISEASE 02/22/2009   Qualifier: Diagnosis of  By: Ronnald Ramp, CNA, Deborah    . GERD (gastroesophageal reflux disease)   . Hypertension   . Nonischemic cardiomyopathy ([place])    [place] 01/2009: Normal coronary arteries, EF 15%. Last echo 09/2010: EF 123456, grade 2 diastolic dysfunction, trivial MR, mild LAE.8/14 EF 40-45%   . Obesity (BMI 30-39.9) 12/07/2015  . OSA (obstructive sleep apnea) 06/12/2015   Mild OSA with total AHI 8/hr and in REM sleep 26/hr with oxygen desaturations as low as 70% with respiratory events.    . OSA (obstructive sleep  apnea)    use CPAP nightly  . Seasonal allergies     Past Surgical History:  Procedure Laterality Date  . POLYSOMNOGRAPHY 4 OR MORE PARAMETERS  08/03/2015  . TUBAL LIGATION     S/P    Current Medications: Outpatient Medications Prior to Visit  Medication Sig Dispense Refill  . carvedilol (COREG) 6.25 MG tablet Take 2 tablets (12.5 mg total) by mouth every 12 (twelve) hours. 120 tablet 11  . cetirizine (ZYRTEC) 10 MG tablet Take 10 mg by mouth daily.    . enalapril (VASOTEC) 5 MG tablet Take 1 tablet (5 mg total) by mouth 2 (two) times daily. 180 tablet 3  . furosemide (LASIX) 40 MG tablet Take 1 tablet (40 mg total) by mouth 2 (two) times daily. 180 tablet 3  . hydrALAZINE (APRESOLINE) 25 MG tablet Take 1 tablet (25 mg total) by mouth 2 (two) times daily. 180 tablet 3  . isosorbide mononitrate (IMDUR) 30 MG 24 hr tablet Take 1 tablet (30 mg total) by mouth daily. 90 tablet 3  . Multiple Vitamin (MULTIVITAMIN) tablet Take 1 tablet by mouth daily.      Marland Kitchen NASONEX 50 MCG/ACT nasal spray Place 2 sprays into both nostrils daily.   0  . potassium chloride SA (K-DUR,KLOR-CON) 20 MEQ tablet Take 2 tablets (40  mEq total) by mouth 2 (two) times daily. 120 tablet 11  . QVAR 80 MCG/ACT inhaler Inhale 2 puffs into the lungs 2 (two) times daily.  0  . TURMERIC PO Take by mouth 2 (two) times daily.     No facility-administered medications prior to visit.      Allergies:   Peanut-containing drug products; Shellfish allergy; and Iodine   Social History   Social History  . Marital status: Single    Spouse name: n/a  . Number of children: 1  . Years of education: N/A   Occupational History  . 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 Smoking status: Former Smoker    Years: 2.00    Types: Cigarettes    Quit date: 04/01/1988  . Smokeless tobacco: Never Used  . Alcohol use Yes     Comment: social  . Drug use: No   . Sexual activity: Not Asked   Other Topics Concern  . None   Social History Narrative   Lives with her son, Donovon, who has special needs.   Divorced.     Family History:  The patient's family history includes Cancer in her father and paternal grandmother; Colon cancer (age of onset: 64) in her father; Coronary artery disease in her father; Diabetes in her maternal grandmother and mother; Hypertension in her other; Mental retardation in her son.   ROS:   Please see the history of present illness.    ROS All other systems reviewed and are negative.  No flowsheet data found.     PHYSICAL EXAM:   VS:  BP 90/64 (BP Location: Left Arm, Patient Position: Sitting, Cuff Size: Normal)   Pulse 80   Ht 5\' 3"  (1.6 m)   Wt 200 lb 6.4 oz (90.9 kg)   BMI 35.50 kg/m    GEN: Well nourished, well developed, in no acute distress  HEENT: normal  Neck: no JVD, carotid bruits, or masses Cardiac: RRR; no murmurs, rubs, or gallops,no edema.  Intact distal pulses bilaterally.  Respiratory:  clear to auscultation bilaterally, normal work of breathing GI: soft, nontender, nondistended, + BS MS: no deformity or atrophy  Skin: warm and dry, no rash Neuro:  Alert and Oriented x 3, Strength and sensation are intact Psych: euthymic mood, full affect  Wt Readings from Last 3 Encounters:  04/11/16 200 lb 6.4 oz (90.9 kg)  03/04/16 197 lb 12.8 oz (89.7 kg)  08/03/15 198 lb (89.8 kg)      Studies/Labs Reviewed:   EKG:  EKG is not ordered today.    Recent Labs: 08/15/2015: Hemoglobin 12.3; Platelets 278 03/04/2016: BUN 11; Creat 0.67; Potassium 3.9; Sodium 138   Lipid Panel    Component Value Date/Time   CHOL 186 03/13/2015 0915   TRIG 61 03/13/2015 0915   HDL 59 03/13/2015 0915   CHOLHDL 3.2 03/13/2015 0915   VLDL 12 03/13/2015 0915   LDLCALC 115 03/13/2015 0915   LDLDIRECT 147.2 03/04/2013 1004    Additional studies/ records that were reviewed today include:  CPAP  download    ASSESSMENT:    1. OSA (obstructive sleep apnea)   2. Essential hypertension   3. Obesity (BMI 30-39.9)      PLAN:  In order of problems listed above:  OSA - the patient is tolerating PAP therapy well without any problems. The PAP download was reviewed today and showed an AHI of  2.5/hr on 12 cm H2O with 20% compliance in using more than 4 hours nightly.  The patient has been using and benefiting from CPAP use and will continue to benefit from therapy. I have encouraged her to be more compliant with her device.  I reviewed with her what the insurance company expects in regard to compliance to assure that they keep covering the device.  She understands and will try to be more compliant. HTN - BP controlled on current meds. Obesity - I have encouraged her to get into a routine exercise program and cut back on carbs and portions.      Medication Adjustments/Labs and Tests Ordered: Current medicines are reviewed at length with the patient today.  Concerns regarding medicines are outlined above.  Medication changes, Labs and Tests ordered today are listed in the Patient Instructions below.  There are no Patient Instructions on file for this visit.   Signed, Fransico Him, MD  04/11/2016 2:26 PM    [address], [place], [place]  [address] Phone: [phone]; Fax: [phone]

## 2016-04-27 ENCOUNTER — Other Ambulatory Visit: Payer: Self-pay | Admitting: Internal Medicine

## 2016-05-27 ENCOUNTER — Other Ambulatory Visit: Payer: Self-pay | Admitting: Obstetrics & Gynecology

## 2016-05-27 ENCOUNTER — Other Ambulatory Visit: Payer: Self-pay | Admitting: Family Medicine

## 2016-05-27 DIAGNOSIS — Z1231 Encounter for screening mammogram for malignant neoplasm of breast: Secondary | ICD-10-CM

## 2016-06-06 ENCOUNTER — Telehealth: Payer: Self-pay | Admitting: Internal Medicine

## 2016-06-06 NOTE — Telephone Encounter (Signed)
New message     Pt is having problem with dizziness and pressure in her face   Thinks she is having sinus issue

## 2016-06-06 NOTE — Telephone Encounter (Signed)
Received incoming call. Pt stated she started having lightheadedness and pressure in her face and ears 3-4 days ago. Pt stated pressure to touch under right eye. Pt denied CP and SOB. Pt taking all medications as prescribed. Pt stated she arrived at her PCP at 11:13 AM for 11:15 AM appt. Pt informed staff she could not wait over 2 hours to be seen. Pt stated "take me off the list" and without being seen. Pt called our office. Pt does not take BP/HR at home. Pt took BP and HR 2 days ago at her pharmacy - BP 117/77 and HR 80s (pt does not remember exact number). Pt's weight today was 200.7 lbs. Pt informed she was sick in November and December with cold symptoms and laryngitis. Pt sufferers from seasonal allergies. Informed to contact PCP to reschedule appt. Informed to call our office with further questions or concerns.

## 2016-06-06 NOTE — Telephone Encounter (Signed)
Please see other phone note from today - this is a duplicate message

## 2016-06-06 NOTE — Telephone Encounter (Signed)
Pt c/o Syncope: STAT if syncope occurred within 30 minutes and pt complains of lightheadedness High Priority if episode of passing out, completely, today or in last 24 hours   Did you pass out today? no 1. When is the last time you passed out? no  2. Has this occurred multiple times? no  3. Did you have any symptoms prior to passing out? None just very dizzy

## 2016-06-12 ENCOUNTER — Ambulatory Visit: Payer: BC Managed Care – PPO

## 2016-06-17 ENCOUNTER — Ambulatory Visit
Admission: RE | Admit: 2016-06-17 | Discharge: 2016-06-17 | Disposition: A | Discharge status: Home / Self Care | Payer: BC Managed Care – PPO | Source: Ambulatory Visit | Attending: Obstetrics & Gynecology | Admitting: Obstetrics & Gynecology

## 2016-06-17 DIAGNOSIS — Z1231 Encounter for screening mammogram for malignant neoplasm of breast: Secondary | ICD-10-CM

## 2017-01-16 ENCOUNTER — Telehealth: Payer: Self-pay | Admitting: Internal Medicine

## 2017-01-16 NOTE — Telephone Encounter (Signed)
New message     Pt would like you to type her up a hardship letter for her, needs letter by October 31

## 2017-01-20 NOTE — Telephone Encounter (Signed)
Spoke with patient.  Would like a letter from Dr. 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   She needs to get more hours and is asking for a 12 month extension, since because of her heart condition she doesn't always operate at 100 %.  I advised that when the letter is ready to be picked up we will call her.

## 2017-01-23 ENCOUNTER — Encounter: Payer: Self-pay | Admitting: Internal Medicine

## 2017-03-13 ENCOUNTER — Other Ambulatory Visit: Payer: Self-pay | Admitting: Internal Medicine

## 2017-03-13 MED ORDER — ENALAPRIL MALEATE 5 MG PO TABS: 5.0000 mg | ORAL_TABLET | Freq: Two times a day (BID) | ORAL | 0 refills | Status: DC

## 2017-03-13 MED ORDER — ISOSORBIDE MONONITRATE ER 30 MG PO TB24: 30.0000 mg | ORAL_TABLET | Freq: Every day | ORAL | 0 refills | Status: DC

## 2017-03-13 MED ORDER — POTASSIUM CHLORIDE CRYS ER 20 MEQ PO TBCR: 40.0000 meq | EXTENDED_RELEASE_TABLET | Freq: Two times a day (BID) | ORAL | 0 refills | Status: DC

## 2017-03-13 MED ORDER — CARVEDILOL 6.25 MG PO TABS: 12.5000 mg | ORAL_TABLET | Freq: Two times a day (BID) | ORAL | 0 refills | Status: DC

## 2017-03-13 MED ORDER — FUROSEMIDE 40 MG PO TABS: 40.0000 mg | ORAL_TABLET | Freq: Two times a day (BID) | ORAL | 0 refills | Status: DC

## 2017-03-13 MED ORDER — HYDRALAZINE HCL 25 MG PO TABS: 25.0000 mg | ORAL_TABLET | Freq: Two times a day (BID) | ORAL | 0 refills | Status: DC

## 2017-03-13 NOTE — Telephone Encounter (Signed)
Pt's medications were sent to pt's pharmacy as requested. Confirmation received.  

## 2017-04-03 ENCOUNTER — Encounter: Payer: Self-pay | Admitting: Gastroenterology

## 2017-04-14 ENCOUNTER — Other Ambulatory Visit: Payer: Self-pay | Admitting: Internal Medicine

## 2017-04-14 MED ORDER — ISOSORBIDE MONONITRATE ER 30 MG PO TB24: 30.0000 mg | ORAL_TABLET | Freq: Every day | ORAL | 0 refills | Status: DC

## 2017-04-24 ENCOUNTER — Ambulatory Visit: Payer: BC Managed Care – PPO | Admitting: Internal Medicine

## 2017-04-24 ENCOUNTER — Encounter: Payer: Self-pay | Admitting: Internal Medicine

## 2017-04-24 VITALS — BP 112/72 | HR 73 | Ht 63.0 in | Wt 197.0 lb

## 2017-04-24 DIAGNOSIS — I5022 Chronic systolic (congestive) heart failure: Secondary | ICD-10-CM

## 2017-04-24 DIAGNOSIS — I10 Essential (primary) hypertension: Secondary | ICD-10-CM

## 2017-04-24 DIAGNOSIS — R5383 Other fatigue: Secondary | ICD-10-CM

## 2017-04-24 NOTE — Patient Instructions (Signed)
Medication Instructions:  Your physician recommends that you continue on your current medications as directed. Please refer to the Current Medication list given to you today.   Labwork: Lab work to be done today--CBC, BMP, BNP, TSH, Lipid profile  Testing/Procedures: none  Follow-Up: Your physician wants you to follow-up in: July or August 2019 You will receive a reminder letter in the mail two months in advance. If you don't receive a letter, please call our office to schedule the follow-up appointment.   Any Other Special Instructions Will Be Listed Below (If Applicable).     If you need a refill on your cardiac medications before your next appointment, please call your pharmacy.

## 2017-04-24 NOTE — Progress Notes (Signed)
Cardiology Office Note   Date:  04/24/2017   ID:  Katrina Carrillo, Katrina Carrillo 1965-09-20, MRN [id number]  PCP:  Carol Ada, MD  Cardiologist:   Dorris Carnes, MD   F/U of mild LV dysfunction and HTN      History of Present Illness: Katrina Carrillo is a 52 y.o. female with a history of NICM, HTN.  Normal coronary arteries at cath in 2010  LVEF 15%  Last echo 40 to 45% Also a history of HTN  I saw her in Dec 2017  She says her breathing is OK  Denies LE edema  Today upset that her wt is up   Denies CP   Does have R back pain  Has appt with Z Tamala Julian in sports med .     Outpatient Medications Prior to Visit  Medication Sig Dispense Refill  . carvedilol (COREG) 6.25 MG tablet Take 2 tablets (12.5 mg total) by mouth every 12 (twelve) hours. Please make appt with Dr. Harrington Challenger before anymore refills. 1st attempt 120 tablet 0  . enalapril (VASOTEC) 5 MG tablet Take 1 tablet (5 mg total) by mouth 2 (two) times daily. Please make appt with Dr. Harrington Challenger before anymore refills. 1st attempt 60 tablet 0  . furosemide (LASIX) 40 MG tablet Take 1 tablet (40 mg total) by mouth 2 (two) times daily. Please make appt with Dr. Harrington Challenger before anymore refills. 1st attempt 60 tablet 0  . hydrALAZINE (APRESOLINE) 25 MG tablet Take 1 tablet (25 mg total) by mouth 2 (two) times daily. Please make appt with Dr. Harrington Challenger before anymore refills. 1st attempt 60 tablet 0  . isosorbide mononitrate (IMDUR) 30 MG 24 hr tablet Take 1 tablet (30 mg total) by mouth daily. Please keep upcoming appt for future refills. Thank you 30 tablet 0  . Multiple Vitamin (MULTIVITAMIN) tablet Take 1 tablet by mouth daily.      Marland Kitchen NASONEX 50 MCG/ACT nasal spray Place 2 sprays into both nostrils daily.   0  . potassium chloride SA (K-DUR,KLOR-CON) 20 MEQ tablet Take 2 tablets (40 mEq total) by mouth 2 (two) times daily. Please make appt with Dr. Harrington Challenger before anymore refills. 1st attempt 120 tablet 0  . QVAR 80 MCG/ACT inhaler Inhale 2 puffs  into the lungs 2 (two) times daily.  0  . TURMERIC PO Take by mouth 2 (two) times daily.    . cetirizine (ZYRTEC) 10 MG tablet Take 10 mg by mouth daily.     No facility-administered medications prior to visit.      Allergies:   Peanut-containing drug products; Shellfish allergy; and Iodine   Past Medical History:  Diagnosis Date  . CHF (congestive heart failure) (HCC)    cardiomyopathy  . Diverticulosis   . Family history of malignant neoplasm of gastrointestinal tract 02/17/2012   Father with colon cancer age 54   . GASTROESOPHAGEAL REFLUX DISEASE 02/22/2009   Qualifier: Diagnosis of  By: Ronnald Ramp, CNA, Deborah    . GERD (gastroesophageal reflux disease)   . Hypertension   . Nonischemic cardiomyopathy ([place])    [place] 01/2009: Normal coronary arteries, EF 15%. Last echo 09/2010: EF 85-63%, grade 2 diastolic dysfunction, trivial MR, mild LAE.8/14 EF 40-45%   . Obesity (BMI 30-39.9) 12/07/2015  . OSA (obstructive sleep apnea) 06/12/2015   Mild OSA with total AHI 8/hr and in REM sleep 26/hr with oxygen desaturations as low as 70% with respiratory events.    . OSA (obstructive sleep apnea)  use CPAP nightly  . Seasonal allergies     Past Surgical History:  Procedure Laterality Date  . POLYSOMNOGRAPHY 4 OR MORE PARAMETERS  08/03/2015  . TUBAL LIGATION     S/P     Social History:  The patient  reports that she quit smoking about 29 years ago. Her smoking use included cigarettes. She quit after 2.00 years of use. she has never used smokeless tobacco. She reports that she drinks alcohol. She reports that she does not use drugs.   Family History:  The patient's family history includes Cancer in her father and paternal grandmother; Colon cancer (age of onset: 35) in her father; Coronary artery disease in her father; Diabetes in her maternal grandmother and mother; Hypertension in her other; Mental retardation in her son.    ROS:  Please see the history of present illness. All other systems  are reviewed and  Negative to the above problem except as noted.    PHYSICAL EXAM: VS:  BP 112/72   Pulse 73   Ht 5\' 3"  (1.6 m)   Wt 197 lb (89.4 kg)   SpO2 98%   BMI 34.90 kg/m   GEN: Obese 52 yo , in no acute distress  HEENT: normal  Neck: JVP is nromal  , carotid bruits, or masses Cardiac: RRR; no murmurs, rubs, or gallops,no edema  Respiratory:  clear to auscultation bilaterally, normal work of breathing GI: soft, nontender, nondistended, + BS  No hepatomegaly  MS: no deformity Moving all extremities   Skin: warm and dry, no rash Neuro:  Strength and sensation are intact Psych: euthymic mood, full affect   EKG:  EKG is not ordered today  Lipid Panel    Component Value Date/Time   CHOL 186 03/13/2015 0915   TRIG 61 03/13/2015 0915   HDL 59 03/13/2015 0915   CHOLHDL 3.2 03/13/2015 0915   VLDL 12 03/13/2015 0915   LDLCALC 115 03/13/2015 0915   LDLDIRECT 147.2 03/04/2013 1004      Wt Readings from Last 3 Encounters:  04/24/17 197 lb (89.4 kg)  04/11/16 200 lb 6.4 oz (90.9 kg)  03/04/16 197 lb 12.8 oz (89.7 kg)      ASSESSMENT AND PLAN:  1  Chronic systolic CHF Volume status is OK  Would check labs  I would not change until labs seen  Consider Aldosterone    2  HTN  BP is good    3  HCM  Check lipids     WIll check BMET, CBC, BNP, TSH and lipid  Tentative f/u in July/Aug unless change meds   Current medicines are reviewed at length with the patient today.  The patient does not have concerns regarding medicines.  Signed, Dorris Carnes, MD  04/24/2017 11:49 AM    [place] [address] [place], [place], [place]  [address] Phone: [phone]; Fax: [phone]

## 2017-04-25 LAB — LIPID PANEL
Chol/HDL Ratio: 3.1 ratio (ref 0.0–4.4)
Cholesterol, Total: 190 mg/dL (ref 100–199)
HDL: 62 mg/dL (ref >39)
LDL Calculated: 113 mg/dL — ABNORMAL HIGH (ref 0–99)
Triglycerides: 75 mg/dL (ref 0–149)
VLDL Cholesterol Cal: 15 mg/dL (ref 5–40)

## 2017-04-25 LAB — CBC
HEMATOCRIT: 39.5 % (ref 34.0–46.6)
Hemoglobin: 12.8 g/dL (ref 11.1–15.9)
MCH: 26.1 pg — ABNORMAL LOW (ref 26.6–33.0)
MCHC: 32.4 g/dL (ref 31.5–35.7)
MCV: 80 fL (ref 79–97)
Platelets: 277 10*3/uL (ref 150–379)
RBC: 4.91 x10E6/uL (ref 3.77–5.28)
RDW: 15.2 % (ref 12.3–15.4)
WBC: 9.9 10*3/uL (ref 3.4–10.8)

## 2017-04-25 LAB — PRO B NATRIURETIC PEPTIDE: NT-Pro BNP: 51 pg/mL (ref 0–249)

## 2017-04-25 LAB — BASIC METABOLIC PANEL
BUN/Creatinine Ratio: 18 (ref 9–23)
BUN: 13 mg/dL (ref 6–24)
CALCIUM: 9.1 mg/dL (ref 8.7–10.2)
CO2: 24 mmol/L (ref 20–29)
Chloride: 99 mmol/L (ref 96–106)
Creatinine, Ser: 0.71 mg/dL (ref 0.57–1.00)
GFR calc non Af Amer: 98 mL/min/{1.73_m2} (ref >59)
GFR, EST AFRICAN AMERICAN: 113 mL/min/{1.73_m2} (ref >59)
Glucose: 96 mg/dL (ref 65–99)
Potassium: 3.9 mmol/L (ref 3.5–5.2)
Sodium: 141 mmol/L (ref 134–144)

## 2017-04-25 LAB — TSH: TSH: 1.05 u[IU]/mL (ref 0.450–4.500)

## 2017-05-05 NOTE — Progress Notes (Signed)
[name] [address] [place], [place] [address] Phone: [phone] Subjective:       CC: Back pain  Katrina Carrillo  S[place] Brick is a 52 y.o. female coming in with complaint of lower back pain on the right side. She has been having pain for the past 4 weeks. No known mechanism of injury. She notices pain in the morning when she sits up from being supine. Patient does take turmeric.  Patient has history of achilles tendonitis and does note that she has heel pain when she wakes up in the morning as well.  Patient states that it seems to be worsening overall.  Not responding to over-the-counter medications.       Past Medical History:  Diagnosis Date  . CHF (congestive heart failure) (HCC)    cardiomyopathy  . Diverticulosis   . Family history of malignant neoplasm of gastrointestinal tract 02/17/2012   Father with colon cancer age 51   . GASTROESOPHAGEAL REFLUX DISEASE 02/22/2009   Qualifier: Diagnosis of  By: Ronnald Ramp, CNA, Deborah    . GERD (gastroesophageal reflux disease)   . Hypertension   . Nonischemic cardiomyopathy ([place])    [place] 01/2009: Normal coronary arteries, EF 15%. Last echo 09/2010: EF 51-70%, grade 2 diastolic dysfunction, trivial MR, mild LAE.8/14 EF 40-45%   . Obesity (BMI 30-39.9) 12/07/2015  . OSA (obstructive sleep apnea) 06/12/2015   Mild OSA with total AHI 8/hr and in REM sleep 26/hr with oxygen desaturations as low as 70% with respiratory events.    . OSA (obstructive sleep apnea)    use CPAP nightly  . Seasonal allergies    Past Surgical History:  Procedure Laterality Date  . POLYSOMNOGRAPHY 4 OR MORE PARAMETERS  08/03/2015  . TUBAL LIGATION     S/P   Social History   Socioeconomic History  . Marital status: Single    Spouse name: n/a  . Number of children: 1  . Years of education: None  . Highest education level: None  Social Needs  . Financial resource strain: None  . Food insecurity - worry: None  .  Food insecurity - inability: None  . Transportation needs - medical: None  . Transportation needs - non-medical: None  Occupational History  . 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 Occupation: Facilities manager: [employer]  Tobacco Use  . Smoking status: Former Smoker    Years: 2.00    Types: Cigarettes    Last attempt to quit: 04/01/1988    Years since quitting: 29.1  . Smokeless tobacco: Never Used  Substance and Sexual Activity  . Alcohol use: Yes    Comment: social  . Drug use: No  . Sexual activity: None  Other Topics Concern  . None  Social History Narrative   Lives with her son, Donovon, who has special needs.   Divorced.   Allergies  Allergen Reactions  . Peanut-Containing Drug Products Anaphylaxis  . Shellfish Allergy Anaphylaxis  . Iodine Swelling and Rash   Family History  Problem Relation Age of Onset  . Coronary artery disease Father   . Colon cancer Father 53  . Cancer Father   . Diabetes Mother   . Diabetes Maternal Grandmother   . Cancer Paternal Grandmother   . Mental retardation Son        TBI during birth  . Hypertension Other   . Stomach cancer  Neg Hx      Past medical history, social, surgical and family history all reviewed in electronic medical record.  No pertanent information unless stated regarding to the chief complaint.   Review of Systems:Review of systems updated and as accurate as of 05/06/17  No headache, visual changes, nausea, vomiting, diarrhea, constipation, dizziness, abdominal pain, skin rash, fevers, chills, night sweats, weight loss, swollen lymph nodes, body aches, joint swelling,  chest pain, shortness of breath, mood changes.  Positive muscle aches  Objective  Blood pressure 118/78, pulse 81, height 5\' 3"  (1.6 m), weight 196 lb (88.9 kg), SpO2 98 %. Systems examined below as of 05/06/17   General: No apparent distress alert and oriented x3 mood and  affect normal, dressed appropriately.  HEENT: Pupils equal, extraocular movements intact mild goiter Respiratory: Patient's speak in full sentences and does not appear short of breath  Cardiovascular: 1+lower extremity edema, non tender, no erythema  Skin: Warm dry intact with no signs of infection or rash on extremities or on axial skeleton.  Abdomen: Soft nontender  Neuro: Cranial nerves II through XII are intact, neurovascularly intact in all extremities with 2+ DTRs and 2+ pulses.  Lymph: No lymphadenopathy of posterior or anterior cervical chain or axillae bilaterally.  Gait normal with good balance and coordination.  MSK:  Non tender with full range of motion and good stability and symmetric strength and tone of shoulders, elbows, wrist, hip, knee and ankles bilaterally.  Back Exam:  Inspection: Mild loss of lordosis Motion: Flexion 45 deg, Extension 45 deg, Side Bending to 45 deg bilaterally,  Rotation to 45 deg bilaterally  SLR laying: Negative but severe pain over the right sacroiliac joint XSLR laying: Negative  Palpable tenderness: Tender over the right sacroiliac joint and L4-L5 areas of the paraspinal musculature lumbar spine. FABER: Severely positive on the right. Sensory change: Gross sensation intact to all lumbar and sacral dermatomes.  Reflexes: 2+ at both patellar tendons, 2+ at achilles tendons, Babinski's downgoing.  Strength at foot  Plantar-flexion: 5/5 Dorsi-flexion: 5/5 Eversion: 5/5 Inversion: 5/5  Leg strength  Quad: 5/5 Hamstring: 5/5 Hip flexor: 5/5 Hip abductors: 4/5  Gait unremarkable.  Procedure 30092; 15 additional minutes spent for Therapeutic exercises as stated in above notes.  This included exercises focusing on stretching, strengthening, with significant focus on eccentric aspects.   Long term goals include an improvement in range of motion, strength, endurance as well as avoiding reinjury. Patient's frequency would include in 1-2 times a day, 3-5  times a week for a duration of 6-12 weeks.Low back exercises that included:  Pelvic tilt/bracing instruction to focus on control of the pelvic girdle and lower abdominal muscles  Glute strengthening exercises, focusing on proper firing of the glutes without engaging the low back muscles Proper stretching techniques for maximum relief for the hamstrings, hip flexors, low back and some rotation where tolerated theraband given    Proper technique shown and discussed handout in great detail with ATC.  All questions were discussed and answered.      Impression and Recommendations:     This case required medical decision making of moderate complexity.      Note: This dictation was prepared with Dragon dictation along with smaller phrase technology. Any transcriptional errors that result from this process are unintentional.

## 2017-05-06 ENCOUNTER — Encounter: Payer: Self-pay | Admitting: Family Medicine

## 2017-05-06 ENCOUNTER — Ambulatory Visit: Payer: BC Managed Care – PPO | Admitting: Family Medicine

## 2017-05-06 ENCOUNTER — Ambulatory Visit (INDEPENDENT_AMBULATORY_CARE_PROVIDER_SITE_OTHER)
Admission: RE | Admit: 2017-05-06 | Discharge: 2017-05-06 | Disposition: A | Discharge status: Home / Self Care | Payer: BC Managed Care – PPO | Source: Ambulatory Visit | Attending: Family Medicine | Admitting: Family Medicine

## 2017-05-06 VITALS — BP 118/78 | HR 81 | Ht 63.0 in | Wt 196.0 lb

## 2017-05-06 DIAGNOSIS — M533 Sacrococcygeal disorders, not elsewhere classified: Secondary | ICD-10-CM | POA: Diagnosis not present

## 2017-05-06 DIAGNOSIS — M545 Low back pain, unspecified: Secondary | ICD-10-CM

## 2017-05-06 DIAGNOSIS — M5416 Radiculopathy, lumbar region: Secondary | ICD-10-CM

## 2017-05-06 MED ORDER — GABAPENTIN 100 MG PO CAPS
200.0000 mg | ORAL_CAPSULE | Freq: Every day | ORAL | 3 refills | Status: DC
Start: 2017-05-06 — End: 2017-05-20

## 2017-05-06 MED ORDER — VITAMIN D (ERGOCALCIFEROL) 1.25 MG (50000 UNIT) PO CAPS: 50000.0000 [IU] | ORAL_CAPSULE | ORAL | 0 refills | Status: DC

## 2017-05-06 NOTE — Patient Instructions (Signed)
Good to see you  Alvera Singh is your friend. Ice 20 minutes 2 times daily. Usually after activity and before bed. Exercises 3 times a week.  pennsaid pinkie amount topically 2 times daily as needed.  Gabapentin 200mg  at night for a week then as needed Once weekly vitamin D for 12 weeks Tart cherry extract any dose at night Continue the turmeric  Xrays downstairs See me again in 2-3 weeks

## 2017-05-06 NOTE — Assessment & Plan Note (Signed)
Patient does have some signs and symptoms consistent with lumbar radiculopathy.  Patient has had many health issues over the course the last several years now.  X-rays ordered today.  Gabapentin given at night, once weekly vitamin D.  Home exercises for sacroiliac joint.  If improvement will consider formal physical therapy and osteopathic manipulation.  Worsening or any signs of weakness or constant numbness will need to consider advanced imaging.  Patient is up again in 4 weeks

## 2017-05-07 ENCOUNTER — Other Ambulatory Visit: Payer: Self-pay | Admitting: Internal Medicine

## 2017-05-07 MED ORDER — FUROSEMIDE 40 MG PO TABS: 40.0000 mg | ORAL_TABLET | Freq: Two times a day (BID) | ORAL | 11 refills | Status: DC

## 2017-05-07 MED ORDER — POTASSIUM CHLORIDE CRYS ER 20 MEQ PO TBCR: 40.0000 meq | EXTENDED_RELEASE_TABLET | Freq: Two times a day (BID) | ORAL | 11 refills | Status: DC

## 2017-05-07 MED ORDER — CARVEDILOL 6.25 MG PO TABS: 12.5000 mg | ORAL_TABLET | Freq: Two times a day (BID) | ORAL | 11 refills | Status: DC

## 2017-05-09 ENCOUNTER — Other Ambulatory Visit: Payer: Self-pay

## 2017-05-09 MED ORDER — HYDRALAZINE HCL 25 MG PO TABS: 25.0000 mg | ORAL_TABLET | Freq: Two times a day (BID) | ORAL | 3 refills | Status: DC

## 2017-05-09 MED ORDER — ENALAPRIL MALEATE 5 MG PO TABS: 5.0000 mg | ORAL_TABLET | Freq: Two times a day (BID) | ORAL | 0 refills | Status: DC

## 2017-05-15 ENCOUNTER — Other Ambulatory Visit: Payer: Self-pay | Admitting: *Deleted

## 2017-05-15 MED ORDER — ISOSORBIDE MONONITRATE ER 30 MG PO TB24: 30.0000 mg | ORAL_TABLET | Freq: Every day | ORAL | 11 refills | Status: DC

## 2017-05-19 NOTE — Progress Notes (Signed)
Cardiology Office Note:    Date:  05/20/2017   ID:  Katrina Carrillo, DOB 12/26/1965, MRN [id number]  PCP:  Carol Ada, MD  Cardiologist:  Dorris Carnes, MD    Referring MD: Carol Ada, MD   Chief Complaint  Patient presents with  . Sleep Apnea    History of Present Illness:    Katrina Carrillo is a 52 y.o. female with a hx of severe OSA with an AHI of 51/hr by PSG and is on CPAP at 12cm H2O.  She is doing well with her CPAP device and thinks that she has gotten used to it.  She tolerates the mask and feels the pressure is adequate.  Since going on CPAP she feels rested in the am when she uses her device and has started noticing some daytime sleepiness when she is not using her device at night.  She has some  mouth dryness.  She does not think that he snores.     Past Medical History:  Diagnosis Date  . CHF (congestive heart failure) (HCC)    cardiomyopathy  . Diverticulosis   . Family history of malignant neoplasm of gastrointestinal tract 02/17/2012   Father with colon cancer age 64   . GASTROESOPHAGEAL REFLUX DISEASE 02/22/2009   Qualifier: Diagnosis of  By: Ronnald Ramp, CNA, Deborah    . GERD (gastroesophageal reflux disease)   . Hypertension   . Nonischemic cardiomyopathy ([place])    [place] 01/2009: Normal coronary arteries, EF 15%. Last echo 09/2010: EF 43-32%, grade 2 diastolic dysfunction, trivial MR, mild LAE.8/14 EF 40-45%   . Obesity (BMI 30-39.9) 12/07/2015  . OSA (obstructive sleep apnea) 06/12/2015   Mild OSA with total AHI 8/hr and in REM sleep 26/hr with oxygen desaturations as low as 70% with respiratory events.    . OSA (obstructive sleep apnea)    use CPAP nightly  . Seasonal allergies     Past Surgical History:  Procedure Laterality Date  . POLYSOMNOGRAPHY 4 OR MORE PARAMETERS  08/03/2015  . TUBAL LIGATION     S/P    Current Medications: Current Meds  Medication Sig  . carvedilol (COREG) 6.25 MG tablet Take 2 tablets (12.5 mg total) by mouth  every 12 (twelve) hours.  . enalapril (VASOTEC) 5 MG tablet Take 1 tablet (5 mg total) by mouth 2 (two) times daily. Please make appt with Dr. Harrington Challenger before anymore refills. 1st attempt  . furosemide (LASIX) 40 MG tablet Take 1 tablet (40 mg total) by mouth 2 (two) times daily.  . hydrALAZINE (APRESOLINE) 25 MG tablet Take 1 tablet (25 mg total) by mouth 2 (two) times daily. Please make appt with Dr. Harrington Challenger before anymore refills. 1st attempt  . isosorbide mononitrate (IMDUR) 30 MG 24 hr tablet Take 1 tablet (30 mg total) by mouth daily.  . Multiple Vitamin (MULTIVITAMIN) tablet Take 1 tablet by mouth daily.    Marland Kitchen NASONEX 50 MCG/ACT nasal spray Place 2 sprays into both nostrils daily.   . potassium chloride SA (K-DUR,KLOR-CON) 20 MEQ tablet Take 2 tablets (40 mEq total) by mouth 2 (two) times daily.  Marland Kitchen QVAR 80 MCG/ACT inhaler Inhale 2 puffs into the lungs 2 (two) times daily.  . TURMERIC PO Take by mouth 2 (two) times daily.  . Vitamin D, Ergocalciferol, (DRISDOL) 50000 units CAPS capsule Take 1 capsule (50,000 Units total) by mouth every 7 (seven) days.     Allergies:   Peanut-containing drug products; Shellfish allergy; and Iodine   Social  History   Socioeconomic History  . Marital status: Single    Spouse name: n/a  . Number of children: 1  . Years of education: Not on file  . Highest education level: Not on file  Social Needs  . Financial resource strain: Not on file  . Food insecurity - worry: Not on file  . Food insecurity - inability: Not on file  . Transportation needs - medical: Not on file  . Transportation needs - non-medical: Not on file  Occupational History  . 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 Occupation: Facilities manager: [employer]  Tobacco Use  . Smoking status: Former Smoker    Years: 2.00    Types: Cigarettes    Last attempt to quit: 04/01/1988    Years since quitting: 29.1  . Smokeless  tobacco: Never Used  Substance and Sexual Activity  . Alcohol use: Yes    Comment: social  . Drug use: No  . Sexual activity: Not on file  Other Topics Concern  . Not on file  Social History Narrative   Lives with her son, Shanda Bumps, who has special needs.   Divorced.     Family History: The patient's  family history includes Cancer in her father and paternal grandmother; Colon cancer (age of onset: 52) in her father; Coronary artery disease in her father; Diabetes in her maternal grandmother and mother; Hypertension in her other; Mental retardation in her son. There is no history of Stomach cancer.  ROS:   Please see the history of present illness.    ROS  All other systems reviewed and negative.   EKGs/Labs/Other Studies Reviewed:    The following studies were reviewed today: PAP download  EKG:  EKG is not ordered today.   Recent Labs: 04/24/2017: BUN 13; Creatinine, Ser 0.71; Hemoglobin 12.8; NT-Pro BNP 51; Platelets 277; Potassium 3.9; Sodium 141; TSH 1.050   Recent Lipid Panel    Component Value Date/Time   CHOL 190 04/24/2017 1210   TRIG 75 04/24/2017 1210   HDL 62 04/24/2017 1210   CHOLHDL 3.1 04/24/2017 1210   CHOLHDL 3.2 03/13/2015 0915   VLDL 12 03/13/2015 0915   LDLCALC 113 (H) 04/24/2017 1210   LDLDIRECT 147.2 03/04/2013 1004    Physical Exam:    VS:  BP 110/66   Pulse 76   Ht 5\' 3"  (1.6 m)   Wt 198 lb 12.8 oz (90.2 kg)   BMI 35.22 kg/m     Wt Readings from Last 3 Encounters:  05/20/17 198 lb 12.8 oz (90.2 kg)  05/06/17 196 lb (88.9 kg)  04/24/17 197 lb (89.4 kg)     GEN:  Well nourished, well developed in no acute distress HEENT: Normal NECK: No JVD; No carotid bruits LYMPHATICS: No lymphadenopathy CARDIAC: RRR, no murmurs, rubs, gallops RESPIRATORY:  Clear to auscultation without rales, wheezing or rhonchi  ABDOMEN: Soft, non-tender, non-distended MUSCULOSKELETAL:  No edema; No deformity  SKIN: Warm and dry NEUROLOGIC:  Alert and oriented  x 3 PSYCHIATRIC:  Normal affect   ASSESSMENT:    1. OSA (obstructive sleep apnea)   2. Essential hypertension   3. Obesity (BMI 30-39.9)    PLAN:    In order of problems listed above:  1.  OSA - the patient is tolerating PAP therapy well without any problems. The PAP download was reviewed today and showed an AHI of 2.9/hr on 12 cm H2O  with 23% compliance in using more than 4 hours nightly.  The patient has been using and benefiting from PAP use and will continue to benefit from therapy. She has been falling asleep on the couch at night and then sleeps there most of the night.  I have encouraged her to try to be more compliant with her device.   2.  HTN - BP is well controlled on exam today.  She will continue on Carevdilol 6.25mg  BID, Hydralazine 25mg  BID and Vasotec 5mg  BID.   3.  Obesity - I have encouraged her to get into a routine exercise program and cut back on carbs and portions.       Medication Adjustments/Labs and Tests Ordered: Current medicines are reviewed at length with the patient today.  Concerns regarding medicines are outlined above.  No orders of the defined types were placed in this encounter.  No orders of the defined types were placed in this encounter.   Signed, Fransico Him, MD  05/20/2017 8:31 AM    [place]

## 2017-05-20 ENCOUNTER — Ambulatory Visit: Payer: BC Managed Care – PPO | Admitting: Cardiology

## 2017-05-20 VITALS — BP 110/66 | HR 76 | Ht 63.0 in | Wt 198.8 lb

## 2017-05-20 DIAGNOSIS — I10 Essential (primary) hypertension: Secondary | ICD-10-CM | POA: Diagnosis not present

## 2017-05-20 DIAGNOSIS — E669 Obesity, unspecified: Secondary | ICD-10-CM | POA: Diagnosis not present

## 2017-05-20 DIAGNOSIS — G4733 Obstructive sleep apnea (adult) (pediatric): Secondary | ICD-10-CM | POA: Diagnosis not present

## 2017-05-20 NOTE — Patient Instructions (Signed)

## 2017-05-20 NOTE — Addendum Note (Signed)
Addended by: Harland German A on: 05/20/2017 09:16 AM   Modules accepted: Orders

## 2017-05-28 ENCOUNTER — Ambulatory Visit: Payer: BC Managed Care – PPO | Admitting: Family Medicine

## 2017-06-09 ENCOUNTER — Encounter: Payer: Self-pay | Admitting: Gastroenterology

## 2017-06-09 ENCOUNTER — Ambulatory Visit: Payer: BC Managed Care – PPO | Admitting: Gastroenterology

## 2017-06-09 VITALS — BP 118/78 | HR 75 | Ht 63.0 in | Wt 200.4 lb

## 2017-06-09 DIAGNOSIS — Z800 Family history of malignant neoplasm of digestive organs: Secondary | ICD-10-CM | POA: Diagnosis not present

## 2017-06-09 DIAGNOSIS — Z8679 Personal history of other diseases of the circulatory system: Secondary | ICD-10-CM

## 2017-06-09 DIAGNOSIS — Z8669 Personal history of other diseases of the nervous system and sense organs: Secondary | ICD-10-CM

## 2017-06-09 NOTE — Progress Notes (Signed)
Katrina Carrillo    [id number]    1966/02/01  Primary Care Physician:Smith, Hal Hope, MD  Referring Physician: Carol Ada, [address] [place], [place] [address]  Chief complaint: Family history of colon cancer  HPI: 52 year old female with history of nonischemic cardiomyopathy, hypertension, congestive heart failure with EF 40-45% based on recent echo cardiogram in 2014 is here to discuss his screening colonoscopy.  She was previously followed by Dr. Deatra Ina and was last seen in December 2013. She has family history of colon cancer in her father at age 6 Last colonoscopy was 5 years ago, March 19, 2012 was normal.  Prior to that July 15, 2005 showed scattered diverticula in the ascending colon, otherwise was a normal exam. Denies any nausea, vomiting, abdominal pain, melena or bright red blood per rectum. She is doing well overall from cardiac standpoint, denies any chest pain, palpitations, shortness of breath or ankle edema.     Outpatient Encounter Medications as of 06/09/2017  Medication Sig  . carvedilol (COREG) 6.25 MG tablet Take 2 tablets (12.5 mg total) by mouth every 12 (twelve) hours.  . enalapril (VASOTEC) 5 MG tablet Take 1 tablet (5 mg total) by mouth 2 (two) times daily. Please make appt with Dr. Harrington Challenger before anymore refills. 1st attempt  . furosemide (LASIX) 40 MG tablet Take 1 tablet (40 mg total) by mouth 2 (two) times daily.  . hydrALAZINE (APRESOLINE) 25 MG tablet Take 1 tablet (25 mg total) by mouth 2 (two) times daily. Please make appt with Dr. Harrington Challenger before anymore refills. 1st attempt  . isosorbide mononitrate (IMDUR) 30 MG 24 hr tablet Take 1 tablet (30 mg total) by mouth daily.  . Multiple Vitamin (MULTIVITAMIN) tablet Take 1 tablet by mouth daily.    Marland Kitchen NASONEX 50 MCG/ACT nasal spray Place 2 sprays into both nostrils daily.   . potassium chloride SA (K-DUR,KLOR-CON) 20 MEQ tablet Take 2 tablets (40 mEq total) by mouth  2 (two) times daily.  Marland Kitchen QVAR 80 MCG/ACT inhaler Inhale 2 puffs into the lungs 2 (two) times daily.  . TURMERIC PO Take by mouth 2 (two) times daily.  . Vitamin D, Ergocalciferol, (DRISDOL) 50000 units CAPS capsule Take 1 capsule (50,000 Units total) by mouth every 7 (seven) days.   No facility-administered encounter medications on file as of 06/09/2017.     Allergies as of 06/09/2017 - Review Complete 06/09/2017  Allergen Reaction Noted  . Peanut-containing drug products Anaphylaxis 02/27/2009  . Shellfish allergy Anaphylaxis 01/02/2012  . Iodine Swelling and Rash 02/27/2009    Past Medical History:  Diagnosis Date  . CHF (congestive heart failure) (HCC)    cardiomyopathy  . Diverticulosis   . Family history of malignant neoplasm of gastrointestinal tract 02/17/2012   Father with colon cancer age 21   . GASTROESOPHAGEAL REFLUX DISEASE 02/22/2009   Qualifier: Diagnosis of  By: Ronnald Ramp, CNA, Deborah    . GERD (gastroesophageal reflux disease)   . Hypertension   . Nonischemic cardiomyopathy ([place])    [place] 01/2009: Normal coronary arteries, EF 15%. Last echo 09/2010: EF 46-50%, grade 2 diastolic dysfunction, trivial MR, mild LAE.8/14 EF 40-45%   . Obesity (BMI 30-39.9) 12/07/2015  . OSA (obstructive sleep apnea) 06/12/2015   Mild OSA with total AHI 8/hr and in REM sleep 26/hr with oxygen desaturations as low as 70% with respiratory events.    . OSA (obstructive sleep apnea)    use CPAP  nightly  . Seasonal allergies     Past Surgical History:  Procedure Laterality Date  . POLYSOMNOGRAPHY 4 OR MORE PARAMETERS  08/03/2015  . TUBAL LIGATION     S/P    Family History  Problem Relation Age of Onset  . Coronary artery disease Father   . Colon cancer Father 56  . Cancer Father   . Diabetes Mother   . Diabetes Maternal Grandmother   . Cancer Paternal Grandmother   . Mental retardation Son        TBI during birth  . Hypertension Other   . Stomach cancer Neg Hx     Social History    Socioeconomic History  . Marital status: Single    Spouse name: n/a  . Number of children: 1  . Years of education: Not on file  . Highest education level: Not on file  Social Needs  . Financial resource strain: Not on file  . Food insecurity - worry: Not on file  . Food insecurity - inability: Not on file  . Transportation needs - medical: Not on file  . Transportation needs - non-medical: Not on file  Occupational History  . 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 Occupation: Facilities manager: [employer]  Tobacco Use  . Smoking status: Former Smoker    Years: 2.00    Types: Cigarettes    Last attempt to quit: 04/01/1988    Years since quitting: 29.2  . Smokeless tobacco: Never Used  Substance and Sexual Activity  . Alcohol use: Yes    Comment: social  . Drug use: No  . Sexual activity: Not on file  Other Topics Concern  . Not on file  Social History Narrative   Lives with her son, Shanda Bumps, who has special needs.   Divorced.      Review of systems: Review of Systems  Constitutional: Negative for fever and chills.  HENT: Negative.   Eyes: Negative for blurred vision.  Respiratory: Negative for cough, shortness of breath and wheezing.   Cardiovascular: Negative for chest pain and palpitations.  Gastrointestinal: as per HPI Genitourinary: Negative for dysuria, urgency, frequency and hematuria.  Musculoskeletal: Negative for myalgias, back pain and joint pain.  Skin: Negative for itching and rash.  Neurological: Negative for dizziness, tremors, focal weakness, seizures and loss of consciousness.  Endo/Heme/Allergies: Positive for seasonal allergies.  Psychiatric/Behavioral: Negative for depression, suicidal ideas and hallucinations.  All other systems reviewed and are negative.   Physical Exam: Vitals:   06/09/17 0843  BP: 118/78  Pulse: 75  SpO2: 98%   Body mass index is 35.5  kg/m. Gen:      No acute distress HEENT:  EOMI, sclera anicteric Neck:     No masses; no thyromegaly Lungs:    Clear to auscultation bilaterally; normal respiratory effort CV:         Regular rate and rhythm; no murmurs Abd:      + bowel sounds; soft, non-tender; no palpable masses, no distension Ext:    No edema; adequate peripheral perfusion Skin:      Warm and dry; no rash Neuro: alert and oriented x 3 Psych: normal mood and affect  Data Reviewed:  Reviewed labs, radiology imaging, old records and pertinent past GI work up   Assessment and Plan/Recommendations:  52 year old female with history of nonischemic cardiomyopathy, EF 40-45%, obesity, obstructive sleep apnea, family history of colon cancer in  her father at age 21 here to discuss screening colonoscopy Last colonoscopy was over 5 years ago, she is due for colorectal cancer screening We will schedule for colonoscopy during this visit The risks and benefits as well as alternatives of endoscopic procedure(s) have been discussed and reviewed. All questions answered. The patient agrees to proceed. Return after the procedure as needed    K. Denzil Magnuson , MD [phone] Mon-Fri 8a-5p [phone] after 5p, weekends, holidays  CC: Carol Ada, MD

## 2017-06-09 NOTE — Patient Instructions (Addendum)
It has been recommended to you by your physician that you have a(n) Colonoscopy completed. Per your request, we did not schedule the procedure(s) today. Please contact our office at [phone] should you decide to have the procedure completed.  If you are age 53 or older, your body mass index should be between 23-30. Your Body mass index is 35.5 kg/m. If this is out of the aforementioned range listed, please consider follow up with your Primary Care Provider.  If you are age 22 or younger, your body mass index should be between 19-25. Your Body mass index is 35.5 kg/m. If this is out of the aformentioned range listed, please consider follow up with your Primary Care Provider.

## 2017-06-11 ENCOUNTER — Encounter: Payer: Self-pay | Admitting: Family Medicine

## 2017-06-11 ENCOUNTER — Ambulatory Visit: Payer: BC Managed Care – PPO | Admitting: Family Medicine

## 2017-06-11 VITALS — BP 110/68 | HR 84 | Ht 63.0 in | Wt 203.0 lb

## 2017-06-11 DIAGNOSIS — M533 Sacrococcygeal disorders, not elsewhere classified: Secondary | ICD-10-CM | POA: Diagnosis not present

## 2017-06-11 DIAGNOSIS — M545 Low back pain: Secondary | ICD-10-CM | POA: Diagnosis not present

## 2017-06-11 NOTE — Patient Instructions (Signed)
Good to see you  Katrina Carrillo is your friend.  Stay active.   Continue the exercises Physical therapy will be calling you  Continue the vitamins See me again in 6-8 weeks

## 2017-06-11 NOTE — Progress Notes (Signed)
[facility] [address] [place], [place] [address] Phone: [phone] Subjective:     CC: Back pain follow-up  PIR:[id number]  Katrina Carrillo is a 52 y.o. female coming in with complaint of back pain follow-up.  Was having more right lumbar radiculopathy. 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  Otherwise fairly unremarkable patient was to start home exercise, icing regimen, topical anti-inflammatories and over-the-counter medications.  Patient was also started on gabapentin as well as once weekly vitamin D.  Patient states that she has been doing the exercises. She started wearing shoes with a heel and that has helped alleviate her pain.     Past Medical History:  Diagnosis Date  . CHF (congestive heart failure) (HCC)    cardiomyopathy  . Diverticulosis   . Family history of malignant neoplasm of gastrointestinal tract 02/17/2012   Father with colon cancer age 56   . GASTROESOPHAGEAL REFLUX DISEASE 02/22/2009   Qualifier: Diagnosis of  By: Ronnald Ramp, CNA, Deborah    . GERD (gastroesophageal reflux disease)   . Hypertension   . Nonischemic cardiomyopathy ([place])    [place] 01/2009: Normal coronary arteries, EF 15%. Last echo 09/2010: EF 30-16%, grade 2 diastolic dysfunction, trivial MR, mild LAE.8/14 EF 40-45%   . Obesity (BMI 30-39.9) 12/07/2015  . OSA (obstructive sleep apnea) 06/12/2015   Mild OSA with total AHI 8/hr and in REM sleep 26/hr with oxygen desaturations as low as 70% with respiratory events.    . OSA (obstructive sleep apnea)    use CPAP nightly  . Seasonal allergies    Past Surgical History:  Procedure Laterality Date  . POLYSOMNOGRAPHY 4 OR MORE PARAMETERS  08/03/2015  . TUBAL LIGATION     S/P   Social History   Socioeconomic History  . Marital status: Single    Spouse name: n/a  . Number of children: 1  . Years of education: None  . Highest education  level: None  Social Needs  . Financial resource strain: None  . Food insecurity - worry: None  . Food insecurity - inability: None  . Transportation needs - medical: None  . Transportation needs - non-medical: None  Occupational History  . 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 Occupation: Facilities manager: [employer]  Tobacco Use  . Smoking status: Former Smoker    Years: 2.00    Types: Cigarettes    Last attempt to quit: 04/01/1988    Years since quitting: 29.2  . Smokeless tobacco: Never Used  Substance and Sexual Activity  . Alcohol use: Yes    Comment: social  . Drug use: No  . Sexual activity: None  Other Topics Concern  . None  Social History Narrative   Lives with her son, Donovon, who has special needs.   Divorced.   Allergies  Allergen Reactions  . Peanut-Containing Drug Products Anaphylaxis  . Shellfish Allergy Anaphylaxis  . Iodine Swelling and Rash   Family History  Problem Relation Age of Onset  . Coronary artery disease Father   . Colon cancer Father 26  . Cancer Father   . Diabetes Mother   . Diabetes Maternal Grandmother   . Cancer Paternal Grandmother   . Mental retardation Son        TBI during birth  . Hypertension Other   . Stomach  cancer Neg Hx      Past medical history, social, surgical and family history all reviewed in electronic medical record.  No pertanent information unless stated regarding to the chief complaint.   Review of Systems:Review of systems updated and as accurate as of 06/11/17  No headache, visual changes, nausea, vomiting, diarrhea, constipation, dizziness, abdominal pain, skin rash, fevers, chills, night sweats, weight loss, swollen lymph nodes, body aches, joint swelling, muscle aches, chest pain, shortness of breath, mood changes.   Objective  Blood pressure 110/68, pulse 84, height 5\' 3"  (1.6 m), weight 203 lb (92.1 kg), SpO2 98 %. Systems  examined below as of 06/11/17   General: No apparent distress alert and oriented x3 mood and affect normal, dressed appropriately.  HEENT: Pupils equal, extraocular movements intact  Respiratory: Patient's speak in full sentences and does not appear short of breath  Cardiovascular: No lower extremity edema, non tender, no erythema  Skin: Warm dry intact with no signs of infection or rash on extremities or on axial skeleton.  Abdomen: Soft nontender  Neuro: Cranial nerves II through XII are intact, neurovascularly intact in all extremities with 2+ DTRs and 2+ pulses.  Lymph: No lymphadenopathy of posterior or anterior cervical chain or axillae bilaterally.  Gait normal with good balance and coordination.  MSK:  Non tender with full range of motion and good stability and symmetric strength and tone of shoulders, elbows, wrist, hip, knee and ankles bilaterally.  Back Exam:  Inspection: Unremarkable  Motion: Flexion 45 deg, Extension 15 deg still has some pain, Side Bending to 45 deg bilaterally,  Rotation to 45 deg bilaterally  SLR laying: Negative  XSLR laying: Negative  Palpable tenderness: Tender to palpation the paraspinal musculature lumbar spine right greater than left. FABER: Mild positive right. Sensory change: Gross sensation intact to all lumbar and sacral dermatomes.  Reflexes: 2+ at both patellar tendons, 2+ at achilles tendons, Babinski's downgoing.  Strength at foot  Plantar-flexion: 5/5 Dorsi-flexion: 5/5 Eversion: 5/5 Inversion: 5/5  Leg strength  Quad: 5/5 Hamstring: 5/5 Hip flexor: 5/5 Hip abductors: 5/5  Gait unremarkable.     Impression and Recommendations:     This case required medical decision making of moderate complexity.      Note: This dictation was prepared with Dragon dictation along with smaller phrase technology. Any transcriptional errors that result from this process are unintentional.

## 2017-06-11 NOTE — Assessment & Plan Note (Signed)
Patient has made some improvement.  Discussed icing regimen and home exercises.  We discussed which activities to do which wants to avoid.  Patient is to increase activity slowly over the course the next several days.  Patient will be sent to formal physical therapy.  I expect patient to do well with conservative therapy.

## 2017-06-18 ENCOUNTER — Encounter: Payer: Self-pay | Admitting: Gastroenterology

## 2017-06-26 ENCOUNTER — Ambulatory Visit: Payer: BC Managed Care – PPO | Admitting: Internal Medicine

## 2017-07-07 ENCOUNTER — Ambulatory Visit: Payer: BC Managed Care – PPO | Attending: Family Medicine | Admitting: Physical Therapy

## 2017-07-11 ENCOUNTER — Ambulatory Visit (AMBULATORY_SURGERY_CENTER): Payer: Self-pay | Admitting: *Deleted

## 2017-07-11 ENCOUNTER — Other Ambulatory Visit: Payer: Self-pay

## 2017-07-11 ENCOUNTER — Encounter: Payer: Self-pay | Admitting: Gastroenterology

## 2017-07-11 VITALS — Ht 63.0 in | Wt 197.0 lb

## 2017-07-11 DIAGNOSIS — Z800 Family history of malignant neoplasm of digestive organs: Secondary | ICD-10-CM

## 2017-07-11 MED ORDER — NA SULFATE-K SULFATE-MG SULF 17.5-3.13-1.6 GM/177ML PO SOLN: 1.0000 | Freq: Once | ORAL | 0 refills | Status: AC

## 2017-07-11 NOTE — Progress Notes (Signed)
No egg or soy allergy known to patient  No issues with past sedation with any surgeries  or procedures, no intubation problems  No diet pills per patient No home 02 use per patient  No blood thinners per patient  Pt denies issues with constipation  No A fib or A flutter  EMMI video sent to pt's e mail - pt declined  Pt given $15 coupon for  suprep

## 2017-07-24 ENCOUNTER — Encounter: Payer: Self-pay | Admitting: Gastroenterology

## 2017-07-24 ENCOUNTER — Ambulatory Visit (AMBULATORY_SURGERY_CENTER): Payer: BC Managed Care – PPO | Admitting: Gastroenterology

## 2017-07-24 ENCOUNTER — Other Ambulatory Visit: Payer: Self-pay

## 2017-07-24 VITALS — BP 101/58 | HR 73 | Temp 98.4°F | Resp 13 | Ht 63.0 in | Wt 197.0 lb

## 2017-07-24 DIAGNOSIS — Z800 Family history of malignant neoplasm of digestive organs: Secondary | ICD-10-CM | POA: Diagnosis not present

## 2017-07-24 DIAGNOSIS — Z1211 Encounter for screening for malignant neoplasm of colon: Secondary | ICD-10-CM | POA: Diagnosis not present

## 2017-07-24 DIAGNOSIS — D120 Benign neoplasm of cecum: Secondary | ICD-10-CM | POA: Diagnosis not present

## 2017-07-24 MED ORDER — SODIUM CHLORIDE 0.9 % IV SOLN: 500.0000 mL | Freq: Once | INTRAVENOUS | Status: DC

## 2017-07-24 NOTE — Progress Notes (Signed)
Pt's states no medical or surgical changes since previsit or office visit. 

## 2017-07-24 NOTE — Progress Notes (Signed)
Called to room to assist during endoscopic procedure.  Patient ID and intended procedure confirmed with present staff. Received instructions for my participation in the procedure from the performing physician.  

## 2017-07-24 NOTE — Patient Instructions (Signed)
YOU HAD AN ENDOSCOPIC PROCEDURE TODAY AT THE  ENDOSCOPY CENTER:   Refer to the procedure report that was given to you for any specific questions about what was found during the examination.  If the procedure report does not answer your questions, please call your gastroenterologist to clarify.  If you requested that your care partner not be given the details of your procedure findings, then the procedure report has been included in a sealed envelope for you to review at your convenience later.  YOU SHOULD EXPECT: Some feelings of bloating in the abdomen. Passage of more gas than usual.  Walking can help get rid of the air that was put into your GI tract during the procedure and reduce the bloating. If you had a lower endoscopy (such as a colonoscopy or flexible sigmoidoscopy) you may notice spotting of blood in your stool or on the toilet paper. If you underwent a bowel prep for your procedure, you may not have a normal bowel movement for a few days.  Please Note:  You might notice some irritation and congestion in your nose or some drainage.  This is from the oxygen used during your procedure.  There is no need for concern and it should clear up in a day or so.  SYMPTOMS TO REPORT IMMEDIATELY:   Following lower endoscopy (colonoscopy or flexible sigmoidoscopy):  Excessive amounts of blood in the stool  Significant tenderness or worsening of abdominal pains  Swelling of the abdomen that is new, acute  Fever of 100F or higher    For urgent or emergent issues, a gastroenterologist can be reached at any hour by calling [phone].   DIET:  We do recommend a small meal at first, but then you may proceed to your regular diet.  Drink plenty of fluids but you should avoid alcoholic beverages for 24 hours.  ACTIVITY:  You should plan to take it easy for the rest of today and you should NOT DRIVE or use heavy machinery until tomorrow (because of the sedation medicines used during the test).     FOLLOW UP: Our staff will call the number listed on your records the next business day following your procedure to check on you and address any questions or concerns that you may have regarding the information given to you following your procedure. If we do not reach you, we will leave a message.  However, if you are feeling well and you are not experiencing any problems, there is no need to return our call.  We will assume that you have returned to your regular daily activities without incident.  If any biopsies were taken you will be contacted by phone or by letter within the next 1-3 weeks.  Please call us at [phone] if you have not heard about the biopsies in 3 weeks.    SIGNATURES/CONFIDENTIALITY: You and/or your care partner have signed paperwork which will be entered into your electronic medical record.  These signatures attest to the fact that that the information above on your After Visit Summary has been reviewed and is understood.  Full responsibility of the confidentiality of this discharge information lies with you and/or your care-partner  Polyp and hemorrhoid information given.. 

## 2017-07-24 NOTE — Op Note (Signed)
[facility] Patient Name: Katrina Carrillo [facility] Procedure Date: 07/24/2017 10:45 AM MRN: [id number] Endoscopist: Mauri Pole , MD Age: 52 Referring MD:  Date of Birth: Nov 01, 1965 Gender: Female Account #: [id number] Procedure:                Colonoscopy Indications:              Screening in patient at increased risk: Family                            history of 1st-degree relative with colorectal                            cancer before age 28 years Medicines:                Monitored Anesthesia Care Procedure:                Pre-Anesthesia Assessment:                           - Prior to the procedure, a History and Physical                            was performed, and patient medications and                            allergies were reviewed. The patient's tolerance of                            previous anesthesia was also reviewed. The risks                            and benefits of the procedure and the sedation                            options and risks were discussed with the patient.                            All questions were answered, and informed consent                            was obtained. Prior Anticoagulants: The patient has                            taken no previous anticoagulant or antiplatelet                            agents. ASA Grade Assessment: II - A patient with                            mild systemic disease. After reviewing the risks                            and benefits, the patient was deemed in  satisfactory condition to undergo the procedure.                           After obtaining informed consent, the colonoscope                            was passed under direct vision. Throughout the                            procedure, the patient's blood pressure, pulse, and                            oxygen saturations were monitored continuously. The                            Colonoscope was  introduced through the anus and                            advanced to the the cecum, identified by                            appendiceal orifice and ileocecal valve. The                            colonoscopy was performed without difficulty. The                            patient tolerated the procedure well. The quality                            of the bowel preparation was excellent. The                            ileocecal valve, appendiceal orifice, and rectum                            were photographed. Scope In: 10:49:59 AM Scope Out: 11:02:48 AM Scope Withdrawal Time: 0 hours 9 minutes 16 seconds  Total Procedure Duration: 0 hours 12 minutes 49 seconds  Findings:                 The perianal and digital rectal examinations were                            normal.                           A 8 mm polyp was found in the cecum. The polyp was                            sessile. The polyp was removed with a cold snare.                            Resection and retrieval were complete.  A few small and large-mouthed diverticula were                            found in the sigmoid colon, descending colon,                            transverse colon and ascending colon. There was                            evidence of diverticular spasm. Peri-diverticular                            erythema was seen. There was evidence of an                            impacted diverticulum.                           Non-bleeding internal hemorrhoids were found during                            retroflexion. The hemorrhoids were small. Complications:            No immediate complications. Estimated Blood Loss:     Estimated blood loss was minimal. Impression:               - One 8 mm polyp in the cecum, removed with a cold                            snare. Resected and retrieved.                           - Mild diverticulosis in the sigmoid colon, in the                             descending colon, in the transverse colon and in                            the ascending colon. There was evidence of                            diverticular spasm. Peri-diverticular erythema was                            seen. There was evidence of an impacted                            diverticulum.                           - Non-bleeding internal hemorrhoids. Recommendation:           - Patient has a contact number available for                            emergencies. The signs and symptoms  of potential                            delayed complications were discussed with the                            patient. Return to normal activities tomorrow.                            Written discharge instructions were provided to the                            patient.                           - Resume previous diet.                           - Continue present medications.                           - Await pathology results.                           - Repeat colonoscopy in 5 years for surveillance                            based on pathology results. Mauri Pole, MD 07/24/2017 11:06:58 AM This report has been signed electronically.

## 2017-07-24 NOTE — Progress Notes (Signed)
Report to PACU, RN, vss, BBS= Clear.  

## 2017-07-25 ENCOUNTER — Telehealth: Payer: Self-pay | Admitting: *Deleted

## 2017-07-25 NOTE — Telephone Encounter (Signed)
  Follow up Call-  Call back number 07/24/2017  Post procedure Call Back phone  # [phone]  Permission to leave phone message Yes  Some recent data might be hidden    Mailbox is full; unable to leave message

## 2017-07-28 ENCOUNTER — Ambulatory Visit: Payer: BC Managed Care – PPO | Admitting: Family Medicine

## 2017-08-01 ENCOUNTER — Encounter: Payer: Self-pay | Admitting: Gastroenterology

## 2017-08-07 ENCOUNTER — Other Ambulatory Visit: Payer: Self-pay | Admitting: Internal Medicine

## 2017-09-17 ENCOUNTER — Other Ambulatory Visit: Payer: Self-pay | Admitting: Obstetrics & Gynecology

## 2017-09-17 DIAGNOSIS — Z1231 Encounter for screening mammogram for malignant neoplasm of breast: Secondary | ICD-10-CM

## 2017-09-18 ENCOUNTER — Ambulatory Visit
Admission: RE | Admit: 2017-09-18 | Discharge: 2017-09-18 | Disposition: A | Discharge status: Home / Self Care | Payer: BC Managed Care – PPO | Source: Ambulatory Visit | Attending: Obstetrics & Gynecology | Admitting: Obstetrics & Gynecology

## 2017-09-18 DIAGNOSIS — Z1231 Encounter for screening mammogram for malignant neoplasm of breast: Secondary | ICD-10-CM

## 2017-12-09 ENCOUNTER — Ambulatory Visit: Payer: BC Managed Care – PPO | Admitting: Sports Medicine

## 2017-12-09 ENCOUNTER — Ambulatory Visit (INDEPENDENT_AMBULATORY_CARE_PROVIDER_SITE_OTHER): Payer: BC Managed Care – PPO

## 2017-12-09 ENCOUNTER — Encounter: Payer: Self-pay | Admitting: Sports Medicine

## 2017-12-09 VITALS — BP 115/61 | HR 83

## 2017-12-09 DIAGNOSIS — M7730 Calcaneal spur, unspecified foot: Secondary | ICD-10-CM | POA: Diagnosis not present

## 2017-12-09 DIAGNOSIS — M79672 Pain in left foot: Secondary | ICD-10-CM

## 2017-12-09 DIAGNOSIS — M2142 Flat foot [pes planus] (acquired), left foot: Secondary | ICD-10-CM

## 2017-12-09 DIAGNOSIS — M7662 Achilles tendinitis, left leg: Secondary | ICD-10-CM

## 2017-12-09 DIAGNOSIS — M2141 Flat foot [pes planus] (acquired), right foot: Secondary | ICD-10-CM | POA: Diagnosis not present

## 2017-12-09 DIAGNOSIS — M7661 Achilles tendinitis, right leg: Secondary | ICD-10-CM | POA: Diagnosis not present

## 2017-12-09 DIAGNOSIS — M722 Plantar fascial fibromatosis: Secondary | ICD-10-CM

## 2017-12-09 DIAGNOSIS — M79671 Pain in right foot: Secondary | ICD-10-CM

## 2017-12-09 MED ORDER — METHYLPREDNISOLONE 4 MG PO TBPK: ORAL_TABLET | ORAL | 0 refills | Status: DC

## 2017-12-09 NOTE — Progress Notes (Signed)
Subjective: Katrina Carrillo is a 52 y.o. female patient who presents to office for evaluation of Right=Left heel pain. Patient complains of progressive pain especially over the last 2-3 years in the Right=Left foot at the Achilles. Ranks pain as soreness at heels especially when pressed that comes and goes and is now interferring with daily activities. Patient has tried seeing ortho doctor with no relief in symptoms and reports that they did not recommend treatment. Patient denies any other pedal complaints.   Review of Systems  Musculoskeletal: Positive for joint pain.  All other systems reviewed and are negative.    Patient Active Problem List   Diagnosis Date Noted  . SI (sacroiliac) joint dysfunction 05/06/2017  . Lumbar radiculopathy 05/06/2017  . Obesity (BMI 30-39.9) 12/07/2015  . OSA (obstructive sleep apnea) 06/12/2015  . Persistent asthma 01/23/2015  . Allergic rhinoconjunctivitis, seasonal and perennial 01/29/2014  . Nonischemic cardiomyopathy ([place]) 08/12/2012  . 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 CHRONIC SYSTOLIC HEART FAILURE 31/54/0086  . Essential hypertension 02/22/2009  . LIVER HEMANGIOMA 02/15/2009    Current Outpatient Medications on File Prior to Visit  Medication Sig Dispense Refill  . albuterol (PROAIR HFA) 108 (90 Base) MCG/ACT inhaler ProAir HFA 90 mcg/actuation aerosol inhaler  inhale 2 puffs by mouth every 4 hours if needed for cough or whee...  (REFER TO PRESCRIPTION NOTES).    Marland Kitchen amoxicillin (AMOXIL) 500 MG capsule amoxicillin 500 mg capsule    . carvedilol (COREG) 6.25 MG tablet Take 2 tablets (12.5 mg total) by mouth every 12 (twelve) hours. 120 tablet 11  . digoxin (DIGITEK) 0.125 MG tablet Digitek 125 mcg tablet    . enalapril (VASOTEC) 5 MG tablet Take 1 tablet (5 mg total) by mouth 2 (two) times daily. 60 tablet 7  . EPINEPHrine (EPIPEN 2-PAK) 0.3 mg/0.3 mL IJ SOAJ injection EpiPen 2-Pak 0.3 mg/0.3 mL injection, auto-injector    . furosemide (LASIX) 40  MG tablet Take 1 tablet (40 mg total) by mouth 2 (two) times daily. 60 tablet 11  . gabapentin (NEURONTIN) 100 MG capsule gabapentin 100 mg capsule    . hydrALAZINE (APRESOLINE) 25 MG tablet Take 1 tablet (25 mg total) by mouth 2 (two) times daily. Please make appt with Dr. Harrington Challenger before anymore refills. 1st attempt 90 tablet 3  . Influenza vac split quadrivalent PF (FLUARIX QUADRIVALENT) 0.5 ML injection Fluarix Quad 2016-2017 (PF) 60 mcg (15 mcg x 4)    . isosorbide mononitrate (IMDUR) 30 MG 24 hr tablet Take 1 tablet (30 mg total) by mouth daily. 30 tablet 11  . Multiple Vitamin (MULTIVITAMIN) tablet Take 1 tablet by mouth daily.      Marland Kitchen NASONEX 50 MCG/ACT nasal spray Place 2 sprays into both nostrils daily.   0  . potassium chloride SA (K-DUR,KLOR-CON) 20 MEQ tablet Take 2 tablets (40 mEq total) by mouth 2 (two) times daily. 120 tablet 11  . TURMERIC PO Take by mouth 2 (two) times daily.    Marland Kitchen UNABLE TO FIND Qvar 80 mcg/actuation Metered Aerosol oral inhaler  inhale 2 puffs by mouth twice a day TO PREVENT COUGH OR WHEEZE...RINSE,GARGLE AND SPIT AFTER USE    . Vitamin D, Ergocalciferol, (DRISDOL) 50000 units CAPS capsule Take 1 capsule (50,000 Units total) by mouth every 7 (seven) days. 12 capsule 0   Current Facility-Administered Medications on File Prior to Visit  Medication Dose Route Frequency Provider Last Rate Last Dose  . 0.9 %  sodium chloride infusion  500 mL Intravenous Once Nandigam,  Venia Minks, MD        Allergies  Allergen Reactions  . Peanut-Containing Drug Products Anaphylaxis  . Shellfish Allergy Anaphylaxis  . Iodine Swelling and Rash    Objective:  General: Alert and oriented x3 in no acute distress  Dermatology: No open lesions bilateral lower extremities, no webspace macerations, no ecchymosis bilateral, all nails x 10 are well manicured.  Vascular: Dorsalis Pedis and Posterior Tibial pedal pulses 1/4, Capillary Fill Time 3 seconds, + pedal hair growth bilateral, no  edema bilateral lower extremities, Temperature gradient within normal limits.  Neurology: Johney Maine sensation intact via light touch bilateral.   Musculoskeletal: Mild tenderness with palpation at insertion of the Achilles on Right=Left, there is calcaneal exostosis with mild soft tissue present and decreased ankle rom with knee extending  vs flexed resembling gastroc equnius bilateral, The achilles tendon feels intact with no nodularity or palpable dell, Thompson sign negative, + pain to plantar fascia bilateral as well, + pes planus noted bilateral.   Gait: Antalgic gait with increased heel off bilateral.   Xrays  Right/Left Foot    Impression: Normal osseous mineralization. Joint spaces preserved except midtarsal joint. No fracture/dislocation/boney destruction. Calcaneal spur present posterior and inferior. Kager's triangle intact with no obliteration. No soft tissue abnormalities or radiopaque foreign bodies.   Assessment and Plan: Problem List Items Addressed This Visit    None    Visit Diagnoses    Plantar fasciitis, bilateral    -  Primary   Relevant Orders   DG Foot Complete Right   DG Foot Complete Left   Achilles tendonitis, bilateral       Relevant Medications   methylPREDNISolone (MEDROL DOSEPAK) 4 MG TBPK tablet   Heel spur, unspecified laterality       Pes planus of both feet       Foot pain, bilateral          -Complete examination performed -Xrays reviewed -Discussed treatement options for tendonitis and fasciitis  -Rx Medrol dose pack, heel lifts, gentle stretching and night splint -No improvement will consider MRI/PT/EPAT -Patient to return to office in 4 weeks if pain is still present or sooner if condition worsens.  Landis Martins, DPM

## 2017-12-19 ENCOUNTER — Ambulatory Visit: Payer: BC Managed Care – PPO | Admitting: Internal Medicine

## 2017-12-19 ENCOUNTER — Encounter: Payer: Self-pay | Admitting: Internal Medicine

## 2017-12-19 ENCOUNTER — Encounter

## 2017-12-19 VITALS — BP 112/80 | HR 58 | Ht 63.0 in | Wt 207.8 lb

## 2017-12-19 DIAGNOSIS — I5022 Chronic systolic (congestive) heart failure: Secondary | ICD-10-CM

## 2017-12-19 DIAGNOSIS — I10 Essential (primary) hypertension: Secondary | ICD-10-CM

## 2017-12-19 MED ORDER — HYDRALAZINE HCL 25 MG PO TABS: 25.0000 mg | ORAL_TABLET | Freq: Two times a day (BID) | ORAL | 3 refills | Status: DC

## 2017-12-19 MED ORDER — ISOSORBIDE MONONITRATE ER 30 MG PO TB24: 30.0000 mg | ORAL_TABLET | Freq: Every day | ORAL | 11 refills | Status: DC

## 2017-12-19 MED ORDER — CARVEDILOL 6.25 MG PO TABS: 12.5000 mg | ORAL_TABLET | Freq: Two times a day (BID) | ORAL | 11 refills | Status: DC

## 2017-12-19 MED ORDER — ENALAPRIL MALEATE 5 MG PO TABS: 5.0000 mg | ORAL_TABLET | Freq: Two times a day (BID) | ORAL | 11 refills | Status: DC

## 2017-12-19 MED ORDER — POTASSIUM CHLORIDE CRYS ER 20 MEQ PO TBCR: 40.0000 meq | EXTENDED_RELEASE_TABLET | Freq: Two times a day (BID) | ORAL | 11 refills | Status: DC

## 2017-12-19 MED ORDER — FUROSEMIDE 40 MG PO TABS: 40.0000 mg | ORAL_TABLET | Freq: Two times a day (BID) | ORAL | 11 refills | Status: DC

## 2017-12-19 NOTE — Progress Notes (Addendum)
Cardiology Office Note   Date:  12/19/2017   ID:  Katrina Carrillo, Katrina Carrillo Nov 03, 1965, MRN [id number]  PCP:  Carol Ada, MD  Cardiologist:   Dorris Carnes, MD   F/U of mild LV dysfunction and HTN      History of Present Illness: Katrina Carrillo is a 52 y.o. female with a history of NICM, HTN.  Normal coronary arteries at cath in 2010  LVEF 15%  Last echo 40 to 45% Also a history of HTN  I saw her in Jan 2018  Since seen her breathing has been good  She deneis CP   No edema    Walking     Outpatient Medications Prior to Visit  Medication Sig Dispense Refill  . albuterol (PROAIR HFA) 108 (90 Base) MCG/ACT inhaler ProAir HFA 90 mcg/actuation aerosol inhaler  inhale 2 puffs by mouth every 4 hours if needed for cough or whee...  (REFER TO PRESCRIPTION NOTES).    Marland Kitchen amoxicillin (AMOXIL) 500 MG capsule amoxicillin 500 mg capsule    . carvedilol (COREG) 6.25 MG tablet Take 2 tablets (12.5 mg total) by mouth every 12 (twelve) hours. 120 tablet 11  . enalapril (VASOTEC) 5 MG tablet Take 1 tablet (5 mg total) by mouth 2 (two) times daily. 60 tablet 7  . EPINEPHrine (EPIPEN 2-PAK) 0.3 mg/0.3 mL IJ SOAJ injection EpiPen 2-Pak 0.3 mg/0.3 mL injection, auto-injector    . furosemide (LASIX) 40 MG tablet Take 1 tablet (40 mg total) by mouth 2 (two) times daily. 60 tablet 11  . hydrALAZINE (APRESOLINE) 25 MG tablet Take 1 tablet (25 mg total) by mouth 2 (two) times daily. Please make appt with Dr. Harrington Challenger before anymore refills. 1st attempt 90 tablet 3  . Influenza vac split quadrivalent PF (FLUARIX QUADRIVALENT) 0.5 ML injection Fluarix Quad 2016-2017 (PF) 60 mcg (15 mcg x 4)    . isosorbide mononitrate (IMDUR) 30 MG 24 hr tablet Take 1 tablet (30 mg total) by mouth daily. 30 tablet 11  . methylPREDNISolone (MEDROL DOSEPAK) 4 MG TBPK tablet Take as directed 21 tablet 0  . Multiple Vitamin (MULTIVITAMIN) tablet Take 1 tablet by mouth daily.      Marland Kitchen NASONEX 50 MCG/ACT nasal spray Place 2  sprays into both nostrils daily.   0  . potassium chloride SA (K-DUR,KLOR-CON) 20 MEQ tablet Take 2 tablets (40 mEq total) by mouth 2 (two) times daily. 120 tablet 11  . TURMERIC PO Take by mouth 2 (two) times daily.    Marland Kitchen UNABLE TO FIND Qvar 80 mcg/actuation Metered Aerosol oral inhaler  inhale 2 puffs by mouth twice a day TO PREVENT COUGH OR WHEEZE...RINSE,GARGLE AND SPIT AFTER USE    . Vitamin D, Ergocalciferol, (DRISDOL) 50000 units CAPS capsule Take 1 capsule (50,000 Units total) by mouth every 7 (seven) days. 12 capsule 0  . digoxin (DIGITEK) 0.125 MG tablet Digitek 125 mcg tablet    . gabapentin (NEURONTIN) 100 MG capsule gabapentin 100 mg capsule     Facility-Administered Medications Prior to Visit  Medication Dose Route Frequency Provider Last Rate Last Dose  . 0.9 %  sodium chloride infusion  500 mL Intravenous Once Nandigam, Venia Minks, MD         Allergies:   Peanut-containing drug products; Shellfish allergy; and Iodine   Past Medical History:  Diagnosis Date  . Allergy   . Asthma    right knee   . CHF (congestive heart failure) ([facility])    cardiomyopathy  .  Diverticulosis   . Family history of malignant neoplasm of gastrointestinal tract 02/17/2012   Father with colon cancer age 31   . GASTROESOPHAGEAL REFLUX DISEASE 02/22/2009   Qualifier: Diagnosis of  By: Ronnald Ramp, CNA, Deborah    . GERD (gastroesophageal reflux disease)    pt states doesnt have recently   . Hypertension   . Nonischemic cardiomyopathy ([place])    [place] 01/2009: Normal coronary arteries, EF 15%. Last echo 09/2010: EF 45-80%, grade 2 diastolic dysfunction, trivial MR, mild LAE.8/14 EF 40-45%   . Obesity (BMI 30-39.9) 12/07/2015  . OSA (obstructive sleep apnea) 06/12/2015   Mild OSA with total AHI 8/hr and in REM sleep 26/hr with oxygen desaturations as low as 70% with respiratory events.    . OSA (obstructive sleep apnea)    use CPAP nightly  . Seasonal allergies   . Sleep apnea    wears cpap    Past  Surgical History:  Procedure Laterality Date  . COLONOSCOPY    . POLYSOMNOGRAPHY 4 OR MORE PARAMETERS  08/03/2015  . TUBAL LIGATION     S/P     Social History:  The patient  reports that she quit smoking about 29 years ago. Her smoking use included cigarettes. She quit after 2.00 years of use. She has never used smokeless tobacco. She reports that she drinks alcohol. She reports that she does not use drugs.   Family History:  The patient's family history includes Cancer in her father, mother, and paternal grandmother; Colon cancer (age of onset: 54) in her father; Coronary artery disease in her father; Diabetes in her maternal grandmother and mother; Hypertension in her other; Mental retardation in her son.    ROS:  Please see the history of present illness. All other systems are reviewed and  Negative to the above problem except as noted.    PHYSICAL EXAM: VS:  BP 112/80   Pulse (!) 58   Ht 5\' 3"  (1.6 m)   Wt 207 lb 12.8 oz (94.3 kg)   BMI 36.81 kg/m   GEN: Obese 52 yo , in no acute distress  HEENT: normal  Neck: JVP is not elevated   No , carotid bruits, or masses Cardiac: RRR; no murmurs, rubs, or gallops,no edema  Respiratory:  clear to auscultation bilaterally, normal work of breathing GI: soft, nontender, nondistended, + BS  No hepatomegaly  MS: no deformity Moving all extremities   Skin: warm and dry, no rash Neuro:  Strength and sensation are intact Psych: euthymic mood, full affect   EKG:  EKG is ordered today  SB 58 bpm   Lipid Panel    Component Value Date/Time   CHOL 190 04/24/2017 1210   TRIG 75 04/24/2017 1210   HDL 62 04/24/2017 1210   CHOLHDL 3.1 04/24/2017 1210   CHOLHDL 3.2 03/13/2015 0915   VLDL 12 03/13/2015 0915   LDLCALC 113 (H) 04/24/2017 1210   LDLDIRECT 147.2 03/04/2013 1004      Wt Readings from Last 3 Encounters:  12/19/17 207 lb 12.8 oz (94.3 kg)  07/24/17 197 lb (89.4 kg)  07/11/17 197 lb (89.4 kg)      ASSESSMENT AND PLAN:  1   Chronic systolic CHF Volume status is OK    Will check BMET    I would also reocmm that she have repeat echocardiogram to reeval LVEF     2  HTN  BP is well controlled   3  HCM  Check lipids     WIll  check BMET, CBC, BNP, TSH and lipid  Tentative f/u in July/Aug unless change meds   Current medicines are reviewed at length with the patient today.  The patient does not have concerns regarding medicines.  Signed, Dorris Carnes, MD  12/19/2017 12:05 PM    [address], [place], [place]  [address] Phone: [phone]; Fax: [phone]

## 2017-12-19 NOTE — Patient Instructions (Signed)
Your physician recommends that you continue on your current medications as directed. Please refer to the Current Medication list given to you today. Your physician recommends that you return for lab work today (BMET, CBC, LIPIDS, a1C) Your physician has requested that you have an echocardiogram. Echocardiography is a painless test that uses sound waves to create images of your heart. It provides your doctor with information about the size and shape of your heart and how well your heart's chambers and valves are working. This procedure takes approximately one hour. There are no restrictions for this procedure. Schedule for in about 3-4 weeks.  Your physician wants you to follow-up in: April, 2020 WITH DR. Harrington Challenger.  You will receive a reminder letter in the mail two months in advance. If you don't receive a letter, please call our office to schedule the follow-up appointment.

## 2017-12-20 LAB — LIPID PANEL
CHOL/HDL RATIO: 3.3 ratio (ref 0.0–4.4)
Cholesterol, Total: 194 mg/dL (ref 100–199)
HDL: 58 mg/dL (ref >39)
LDL Calculated: 121 mg/dL — ABNORMAL HIGH (ref 0–99)
TRIGLYCERIDES: 77 mg/dL (ref 0–149)
VLDL Cholesterol Cal: 15 mg/dL (ref 5–40)

## 2017-12-20 LAB — BASIC METABOLIC PANEL
BUN / CREAT RATIO: 23 (ref 9–23)
BUN: 14 mg/dL (ref 6–24)
CALCIUM: 9 mg/dL (ref 8.7–10.2)
CHLORIDE: 101 mmol/L (ref 96–106)
CO2: 24 mmol/L (ref 20–29)
CREATININE: 0.6 mg/dL (ref 0.57–1.00)
GFR calc non Af Amer: 105 mL/min/{1.73_m2} (ref >59)
GFR, EST AFRICAN AMERICAN: 121 mL/min/{1.73_m2} (ref >59)
GLUCOSE: 89 mg/dL (ref 65–99)
Potassium: 4 mmol/L (ref 3.5–5.2)
Sodium: 140 mmol/L (ref 134–144)

## 2017-12-20 LAB — CBC
HEMATOCRIT: 40.2 % (ref 34.0–46.6)
HEMOGLOBIN: 13 g/dL (ref 11.1–15.9)
MCH: 26.9 pg (ref 26.6–33.0)
MCHC: 32.3 g/dL (ref 31.5–35.7)
MCV: 83 fL (ref 79–97)
Platelets: 300 10*3/uL (ref 150–450)
RBC: 4.84 x10E6/uL (ref 3.77–5.28)
RDW: 15 % (ref 12.3–15.4)
WBC: 13.8 10*3/uL — ABNORMAL HIGH (ref 3.4–10.8)

## 2017-12-20 LAB — HEMOGLOBIN A1C
ESTIMATED AVERAGE GLUCOSE: 128 mg/dL
Hgb A1c MFr Bld: 6.1 % — ABNORMAL HIGH (ref 4.8–5.6)

## 2017-12-24 ENCOUNTER — Other Ambulatory Visit: Payer: Self-pay | Admitting: *Deleted

## 2017-12-24 DIAGNOSIS — D72829 Elevated white blood cell count, unspecified: Secondary | ICD-10-CM

## 2018-01-06 ENCOUNTER — Ambulatory Visit: Payer: BC Managed Care – PPO | Admitting: Sports Medicine

## 2018-01-20 ENCOUNTER — Ambulatory Visit (HOSPITAL_COMMUNITY): Payer: BC Managed Care – PPO | Attending: Cardiovascular Disease

## 2018-01-20 ENCOUNTER — Ambulatory Visit: Payer: BC Managed Care – PPO | Admitting: Sports Medicine

## 2018-01-20 ENCOUNTER — Other Ambulatory Visit: Payer: Self-pay

## 2018-01-20 ENCOUNTER — Encounter: Payer: Self-pay | Admitting: Sports Medicine

## 2018-01-20 DIAGNOSIS — I10 Essential (primary) hypertension: Secondary | ICD-10-CM | POA: Insufficient documentation

## 2018-01-20 DIAGNOSIS — M7661 Achilles tendinitis, right leg: Secondary | ICD-10-CM

## 2018-01-20 DIAGNOSIS — M79672 Pain in left foot: Secondary | ICD-10-CM

## 2018-01-20 DIAGNOSIS — M7730 Calcaneal spur, unspecified foot: Secondary | ICD-10-CM

## 2018-01-20 DIAGNOSIS — M79671 Pain in right foot: Secondary | ICD-10-CM

## 2018-01-20 DIAGNOSIS — M2141 Flat foot [pes planus] (acquired), right foot: Secondary | ICD-10-CM | POA: Diagnosis not present

## 2018-01-20 DIAGNOSIS — M7662 Achilles tendinitis, left leg: Secondary | ICD-10-CM

## 2018-01-20 DIAGNOSIS — I5022 Chronic systolic (congestive) heart failure: Secondary | ICD-10-CM | POA: Diagnosis present

## 2018-01-20 DIAGNOSIS — M722 Plantar fascial fibromatosis: Secondary | ICD-10-CM | POA: Diagnosis not present

## 2018-01-20 DIAGNOSIS — M2142 Flat foot [pes planus] (acquired), left foot: Secondary | ICD-10-CM

## 2018-01-20 DIAGNOSIS — Z780 Asymptomatic menopausal state: Secondary | ICD-10-CM | POA: Insufficient documentation

## 2018-01-20 NOTE — Progress Notes (Signed)
Subjective: Darletta Noblett is a 52 y.o. female patient who returns to office for follow up evaluation of Right=Left heel pain. Patient states that her heels feel better; there's a little soreness but much better. Reports that the medication and her night splint helps. Denies constitutional symptoms. Patient denies any other pedal complaints.     Patient Active Problem List   Diagnosis Date Noted  . Menopause present 01/20/2018  . SI (sacroiliac) joint dysfunction 05/06/2017  . Lumbar radiculopathy 05/06/2017  . Obesity (BMI 30-39.9) 12/07/2015  . OSA (obstructive sleep apnea) 06/12/2015  . Persistent asthma 01/23/2015  . Allergic rhinoconjunctivitis, seasonal and perennial 01/29/2014  . Nonischemic cardiomyopathy ([place]) 08/12/2012  . 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 CHRONIC SYSTOLIC HEART FAILURE 69/62/9528  . Essential hypertension 02/22/2009  . LIVER HEMANGIOMA 02/15/2009    Current Outpatient Medications on File Prior to Visit  Medication Sig Dispense Refill  . albuterol (PROAIR HFA) 108 (90 Base) MCG/ACT inhaler ProAir HFA 90 mcg/actuation aerosol inhaler  inhale 2 puffs by mouth every 4 hours if needed for cough or whee...  (REFER TO PRESCRIPTION NOTES).    Marland Kitchen amoxicillin (AMOXIL) 500 MG capsule amoxicillin 500 mg capsule    . carvedilol (COREG) 6.25 MG tablet Take 2 tablets (12.5 mg total) by mouth every 12 (twelve) hours. 120 tablet 11  . enalapril (VASOTEC) 5 MG tablet Take 1 tablet (5 mg total) by mouth 2 (two) times daily. 60 tablet 11  . EPINEPHrine (EPIPEN 2-PAK) 0.3 mg/0.3 mL IJ SOAJ injection EpiPen 2-Pak 0.3 mg/0.3 mL injection, auto-injector    . furosemide (LASIX) 40 MG tablet Take 1 tablet (40 mg total) by mouth 2 (two) times daily. 60 tablet 11  . hydrALAZINE (APRESOLINE) 25 MG tablet Take 1 tablet (25 mg total) by mouth 2 (two) times daily. Please make appt with Dr. Harrington Challenger before anymore refills. 1st attempt 90 tablet 3  . Influenza vac split quadrivalent PF (FLUARIX  QUADRIVALENT) 0.5 ML injection Fluarix Quad 2016-2017 (PF) 60 mcg (15 mcg x 4)    . isosorbide mononitrate (IMDUR) 30 MG 24 hr tablet Take 1 tablet (30 mg total) by mouth daily. 30 tablet 11  . methylPREDNISolone (MEDROL DOSEPAK) 4 MG TBPK tablet Take as directed 21 tablet 0  . Multiple Vitamin (MULTIVITAMIN) tablet Take 1 tablet by mouth daily.      Marland Kitchen NASONEX 50 MCG/ACT nasal spray Place 2 sprays into both nostrils daily.   0  . potassium chloride SA (K-DUR,KLOR-CON) 20 MEQ tablet Take 2 tablets (40 mEq total) by mouth 2 (two) times daily. 120 tablet 11  . TURMERIC PO Take by mouth 2 (two) times daily.    Marland Kitchen UNABLE TO FIND Qvar 80 mcg/actuation Metered Aerosol oral inhaler  inhale 2 puffs by mouth twice a day TO PREVENT COUGH OR WHEEZE...RINSE,GARGLE AND SPIT AFTER USE    . Vitamin D, Ergocalciferol, (DRISDOL) 50000 units CAPS capsule Take 1 capsule (50,000 Units total) by mouth every 7 (seven) days. 12 capsule 0   Current Facility-Administered Medications on File Prior to Visit  Medication Dose Route Frequency Provider Last Rate Last Dose  . 0.9 %  sodium chloride infusion  500 mL Intravenous Once Nandigam, Venia Minks, MD        Allergies  Allergen Reactions  . Peanut-Containing Drug Products Anaphylaxis  . Shellfish Allergy Anaphylaxis  . Iodine Swelling and Rash    Objective:  General: Alert and oriented x3 in no acute distress  Dermatology: No open lesions bilateral lower extremities,  no webspace macerations, no ecchymosis bilateral, all nails x 10 are well manicured.  Vascular: Dorsalis Pedis and Posterior Tibial pedal pulses 1/4, Capillary Fill Time 3 seconds, + pedal hair growth bilateral, no edema bilateral lower extremities, Temperature gradient within normal limits.  Neurology: Johney Maine sensation intact via light touch bilateral.   Musculoskeletal: Decreased tenderness with palpation at insertion of the Achilles on Right and Left, there is calcaneal exostosis with mild soft  tissue present and decreased ankle rom with knee extending  vs flexed resembling gastroc equnius bilateral, The achilles tendon feels intact with no nodularity or palpable dell, Thompson sign negative, + pain to plantar fascia bilateral as well that is much improved, + pes planus noted bilateral.   Assessment and Plan: Problem List Items Addressed This Visit    None    Visit Diagnoses    Plantar fasciitis, bilateral    -  Primary   Achilles tendonitis, bilateral       Heel spur, unspecified laterality       Pes planus of both feet       Foot pain, bilateral          -Complete examination performed -Discussed treatement options for tendonitis and fasciitis bilateral  -Gave achilles pads/sleeves -Recommend continue with icing, stretching, and Night splint -No improvement will consider MRI/PT/EPAT -Patient to return to office as needed or sooner if condition worsens.  Landis Martins, DPM

## 2018-08-18 ENCOUNTER — Telehealth: Payer: Self-pay | Admitting: *Deleted

## 2018-08-18 NOTE — Telephone Encounter (Signed)

## 2018-08-19 NOTE — Progress Notes (Signed)
Virtual Visit via Video Note   This visit type was conducted due to national recommendations for restrictions regarding the COVID-19 Pandemic (e.g. social distancing) in an effort to limit this patient's exposure and mitigate transmission in our community.  Due to her co-morbid illnesses, this patient is at least at moderate risk for complications without adequate follow up.  This format is felt to be most appropriate for this patient at this time.  All issues noted in this document were discussed and addressed.  A limited physical exam was performed with this format.  Please refer to the patient's chart for her consent to telehealth for [facility].   Date:  08/20/2018   ID:  Katrina Carrillo, DOB Dec 11, 1965, MRN [id number]  Patient Location: Home Provider Location: Home  PCP:  Carol Ada, MD  Cardiologist:  Dorris Carnes, MD  Electrophysiologist:  None   Evaluation Performed:  Follow-Up Visit  Chief Complaint:  6 months follow up  History of Present Illness:    Katrina Carrillo is a 53 y.o. female with hx of NICM with improved, Chronic combined CHF, HTN and OSA (non compliant) seem for follow up.  Normal coronary arteries at cath in 2010.  LVEF 15%. Last echo 12/2017 showed LVEF of 35-40% (Dr. Harrington Challenger felt 40-45%, similar to 2014).  Patient is doing well during this pandemic.  Following proper social distancing and wearing mask when goes outside.  She denies chest pain, shortness of breath, orthopnea, PND, syncope, lower extremity edema, palpitation, dizziness or melena.  She is compliant with her medication.  No regular exercise.  She is mostly working from home.  The patient does not have symptoms concerning for COVID-19 infection (fever, chills, cough, or new shortness of breath).    Past Medical History:  Diagnosis Date  . Allergy   . Asthma    right knee   . CHF (congestive heart failure) (HCC)    cardiomyopathy  . Diverticulosis   . Family history of  malignant neoplasm of gastrointestinal tract 02/17/2012   Father with colon cancer age 77   . GASTROESOPHAGEAL REFLUX DISEASE 02/22/2009   Qualifier: Diagnosis of  By: Ronnald Ramp, CNA, Deborah    . GERD (gastroesophageal reflux disease)    pt states doesnt have recently   . Hypertension   . Nonischemic cardiomyopathy ([place])    [place] 01/2009: Normal coronary arteries, EF 15%. Last echo 09/2010: EF 62-69%, grade 2 diastolic dysfunction, trivial MR, mild LAE.8/14 EF 40-45%   . Obesity (BMI 30-39.9) 12/07/2015  . OSA (obstructive sleep apnea) 06/12/2015   Mild OSA with total AHI 8/hr and in REM sleep 26/hr with oxygen desaturations as low as 70% with respiratory events.    . OSA (obstructive sleep apnea)    use CPAP nightly  . Seasonal allergies   . Sleep apnea    wears cpap   Past Surgical History:  Procedure Laterality Date  . COLONOSCOPY    . POLYSOMNOGRAPHY 4 OR MORE PARAMETERS  08/03/2015  . TUBAL LIGATION     S/P     Current Meds  Medication Sig  . albuterol (PROAIR HFA) 108 (90 Base) MCG/ACT inhaler ProAir HFA 90 mcg/actuation aerosol inhaler  inhale 2 puffs by mouth every 4 hours if needed for cough or whee...  (REFER TO PRESCRIPTION NOTES).  Marland Kitchen amoxicillin (AMOXIL) 500 MG capsule Take 500 mg by mouth as needed (for dental visits).   . carvedilol (COREG) 6.25 MG tablet Take 2 tablets (12.5 mg total) by mouth  every 12 (twelve) hours.  . enalapril (VASOTEC) 5 MG tablet Take 1 tablet (5 mg total) by mouth 2 (two) times daily.  Marland Kitchen EPINEPHrine (EPIPEN 2-PAK) 0.3 mg/0.3 mL IJ SOAJ injection EpiPen 2-Pak 0.3 mg/0.3 mL injection, auto-injector  . furosemide (LASIX) 40 MG tablet Take 1 tablet (40 mg total) by mouth 2 (two) times daily.  . hydrALAZINE (APRESOLINE) 25 MG tablet Take 1 tablet (25 mg total) by mouth 2 (two) times daily. Please make appt with Dr. Harrington Challenger before anymore refills. 1st attempt  . isosorbide mononitrate (IMDUR) 30 MG 24 hr tablet Take 1 tablet (30 mg total) by mouth daily.  Marland Kitchen  loratadine (CLARITIN) 10 MG tablet Take 10 mg by mouth daily.  . Multiple Vitamin (MULTIVITAMIN) tablet Take 1 tablet by mouth daily.    . Multiple Vitamins-Minerals (THRIVE FOR LIFE WOMENS) TABS Take 1 tablet by mouth daily.  Marland Kitchen NASONEX 50 MCG/ACT nasal spray Place 2 sprays into both nostrils as needed.   . potassium chloride SA (K-DUR) 20 MEQ tablet Take 2 tablets (40 mEq total) by mouth 2 (two) times daily.  . TURMERIC PO Take by mouth 2 (two) times daily.  Marland Kitchen UNABLE TO FIND Qvar 80 mcg/actuation Metered Aerosol oral inhaler  inhale 2 puffs by mouth twice a day TO PREVENT COUGH OR WHEEZE...RINSE,GARGLE AND SPIT AFTER USE  . VITAMIN D PO Take 1 tablet by mouth daily.  . [DISCONTINUED] furosemide (LASIX) 40 MG tablet Take 1 tablet (40 mg total) by mouth 2 (two) times daily.  . [DISCONTINUED] isosorbide mononitrate (IMDUR) 30 MG 24 hr tablet Take 1 tablet (30 mg total) by mouth daily.  . [DISCONTINUED] potassium chloride SA (K-DUR,KLOR-CON) 20 MEQ tablet Take 2 tablets (40 mEq total) by mouth 2 (two) times daily.   Current Facility-Administered Medications for the 08/20/18 encounter (Telemedicine) with Leanor Kail, PA  Medication  . 0.9 %  sodium chloride infusion     Allergies:   Peanut-containing drug products; Shellfish allergy; and Iodine   Social History   Tobacco Use  . Smoking status: Former Smoker    Years: 2.00    Types: Cigarettes    Last attempt to quit: 04/01/1988    Years since quitting: 30.4  . Smokeless tobacco: Never Used  Substance Use Topics  . Alcohol use: Yes    Comment: social  . Drug use: No     Family Hx: The patient's family history includes Cancer in her father, mother, and paternal grandmother; Colon cancer (age of onset: 31) in her father; Coronary artery disease in her father; Diabetes in her maternal grandmother and mother; Hypertension in an other family member; Mental retardation in her son. There is no history of Stomach cancer, Colon polyps, or  Rectal cancer.  ROS:   Please see the history of present illness.    All other systems reviewed and are negative.   Prior CV studies:   The following studies were reviewed today:  Echo 12/2017 Study Conclusions  - Left ventricle: The cavity size was normal. Wall thickness was   normal. Systolic function was moderately reduced. The estimated   ejection fraction was in the range of 35% to 40%. Diffuse   hypokinesis. Features are consistent with a pseudonormal left   ventricular filling pattern, with concomitant abnormal relaxation   and increased filling pressure (grade 2 diastolic dysfunction).  Labs/Other Tests and Data Reviewed:    EKG:  No ECG reviewed.  Recent Labs: 12/19/2017: BUN 14; Creatinine, Ser 0.60; Hemoglobin 13.0; Platelets 300;  Potassium 4.0; Sodium 140   Recent Lipid Panel Lab Results  Component Value Date/Time   CHOL 194 12/19/2017 12:30 PM   TRIG 77 12/19/2017 12:30 PM   HDL 58 12/19/2017 12:30 PM   CHOLHDL 3.3 12/19/2017 12:30 PM   CHOLHDL 3.2 03/13/2015 09:15 AM   LDLCALC 121 (H) 12/19/2017 12:30 PM   LDLDIRECT 147.2 03/04/2013 10:04 AM    Wt Readings from Last 3 Encounters:  08/20/18 208 lb (94.3 kg)  12/19/17 207 lb 12.8 oz (94.3 kg)  07/24/17 197 lb (89.4 kg)     Objective:    Vital Signs:  BP 134/86   Pulse 78   Ht 5\' 3"  (1.6 m)   Wt 208 lb (94.3 kg)   BMI 36.85 kg/m    VITAL SIGNS:  reviewed GEN:  no acute distress EYES:  sclerae anicteric, EOMI - Extraocular Movements Intact RESPIRATORY:  normal respiratory effort, symmetric expansion CARDIOVASCULAR:  no peripheral edema SKIN:  no rash, lesions or ulcers. MUSCULOSKELETAL:  no obvious deformities. NEURO:  alert and oriented x 3, no obvious focal deficit PSYCH:  normal affect  ASSESSMENT & PLAN:    1. Chronic combined CHF - Stable EF by echo in 2019 per Dr. Harrington Challenger reading compared to 2014.  Euvolemic.  Continue current medical therapy.  2. HTN -Blood pressure stable and well  controlled on current medication.  3. OSA on CPAP - Wears intermittently. Encouraged compliance.  COVID-19 Education: The signs and symptoms of COVID-19 were discussed with the patient and how to seek care for testing (follow up with PCP or arrange E-visit).  The importance of social distancing was discussed today.  Time:   Today, I have spent 10 minutes with the patient with telehealth technology discussing the above problems.     Medication Adjustments/Labs and Tests Ordered: Current medicines are reviewed at length with the patient today.  Concerns regarding medicines are outlined above.   Tests Ordered: No orders of the defined types were placed in this encounter.   Medication Changes: Meds ordered this encounter  Medications  . potassium chloride SA (K-DUR) 20 MEQ tablet    Sig: Take 2 tablets (40 mEq total) by mouth 2 (two) times daily.    Dispense:  180 tablet    Refill:  3  . furosemide (LASIX) 40 MG tablet    Sig: Take 1 tablet (40 mg total) by mouth 2 (two) times daily.    Dispense:  180 tablet    Refill:  3  . isosorbide mononitrate (IMDUR) 30 MG 24 hr tablet    Sig: Take 1 tablet (30 mg total) by mouth daily.    Dispense:  90 tablet    Refill:  3    Disposition:  Follow up in 6 month(s)  Signed, Leanor Kail, PA  08/20/2018 9:52 AM    [facility]

## 2018-08-20 ENCOUNTER — Other Ambulatory Visit: Payer: Self-pay

## 2018-08-20 ENCOUNTER — Telehealth (INDEPENDENT_AMBULATORY_CARE_PROVIDER_SITE_OTHER): Payer: BC Managed Care – PPO | Admitting: Physician Assistant

## 2018-08-20 ENCOUNTER — Encounter: Payer: Self-pay | Admitting: Physician Assistant

## 2018-08-20 VITALS — BP 134/86 | HR 78 | Ht 63.0 in | Wt 208.0 lb

## 2018-08-20 DIAGNOSIS — I10 Essential (primary) hypertension: Secondary | ICD-10-CM

## 2018-08-20 DIAGNOSIS — E669 Obesity, unspecified: Secondary | ICD-10-CM

## 2018-08-20 DIAGNOSIS — Z7189 Other specified counseling: Secondary | ICD-10-CM

## 2018-08-20 DIAGNOSIS — G4733 Obstructive sleep apnea (adult) (pediatric): Secondary | ICD-10-CM | POA: Diagnosis not present

## 2018-08-20 DIAGNOSIS — I5022 Chronic systolic (congestive) heart failure: Secondary | ICD-10-CM

## 2018-08-20 MED ORDER — POTASSIUM CHLORIDE CRYS ER 20 MEQ PO TBCR: 40.0000 meq | EXTENDED_RELEASE_TABLET | Freq: Two times a day (BID) | ORAL | 3 refills | Status: DC

## 2018-08-20 MED ORDER — FUROSEMIDE 40 MG PO TABS: 40.0000 mg | ORAL_TABLET | Freq: Two times a day (BID) | ORAL | 3 refills | Status: DC

## 2018-08-20 MED ORDER — ISOSORBIDE MONONITRATE ER 30 MG PO TB24: 30.0000 mg | ORAL_TABLET | Freq: Every day | ORAL | 3 refills | Status: DC

## 2018-08-20 NOTE — Patient Instructions (Signed)
Medication Instructions:  Your physician recommends that you continue on your current medications as directed. Please refer to the Current Medication list given to you today.  If you need a refill on your cardiac medications before your next appointment, please call your pharmacy.   Lab work: NONE ORDERED  If you have labs (blood work) drawn today and your tests are completely normal, you will receive your results only by: Marland Kitchen MyChart Message (if you have MyChart) OR . A paper copy in the mail If you have any lab test that is abnormal or we need to change your treatment, we will call you to review the results.  Testing/Procedures: NONE ORDERED  Follow-Up: At [facility], you and your health needs are our priority.  As part of our continuing mission to provide you with exceptional heart care, we have created designated Provider Care Teams.  These Care Teams include your primary Cardiologist (physician) and Advanced Practice Providers (APPs -  Physician Assistants and Nurse Practitioners) who all work together to provide you with the care you need, when you need it. You will need a follow up appointment in:  6 months.  Please call our office 2 months in advance to schedule this appointment.  You may see Dorris Carnes, MD or one of the following Advanced Practice Providers on your designated Care Team: Richardson Dopp, PA-C [place], [place] . Daune Perch, NP  Any Other Special Instructions Will Be Listed Below (If Applicable).

## 2018-09-23 ENCOUNTER — Telehealth: Payer: Self-pay | Admitting: Internal Medicine

## 2018-09-23 NOTE — Telephone Encounter (Signed)
Patient called stating she needs a letter for work accommodations, stating that she should stay tele-working due to her heart conditions.

## 2018-09-24 ENCOUNTER — Encounter: Payer: Self-pay | Admitting: Internal Medicine

## 2018-09-24 NOTE — Telephone Encounter (Signed)
Note was done

## 2018-09-25 NOTE — Progress Notes (Signed)
Letter routed to patient via [place].

## 2018-09-25 NOTE — Telephone Encounter (Signed)
Called patient. Informed letter has been prepared and sent to her MyChart. She will come by office and pick up hard copy of letter. Will place in folder downstairs.

## 2018-10-08 ENCOUNTER — Other Ambulatory Visit: Payer: Self-pay | Admitting: Internal Medicine

## 2018-11-11 ENCOUNTER — Other Ambulatory Visit: Payer: Self-pay | Admitting: Family Medicine

## 2018-11-11 DIAGNOSIS — Z1231 Encounter for screening mammogram for malignant neoplasm of breast: Secondary | ICD-10-CM

## 2018-12-13 ENCOUNTER — Ambulatory Visit (HOSPITAL_COMMUNITY)
Admission: EM | Admit: 2018-12-13 | Discharge: 2018-12-13 | Disposition: A | Discharge status: Home / Self Care | Payer: BC Managed Care – PPO | Attending: Family Medicine | Admitting: Family Medicine

## 2018-12-13 ENCOUNTER — Encounter (HOSPITAL_COMMUNITY): Payer: Self-pay

## 2018-12-13 ENCOUNTER — Other Ambulatory Visit: Payer: Self-pay

## 2018-12-13 DIAGNOSIS — T783XXA Angioneurotic edema, initial encounter: Secondary | ICD-10-CM

## 2018-12-13 MED ORDER — DIPHENHYDRAMINE HCL 25 MG PO CAPS
25.0000 mg | ORAL_CAPSULE | Freq: Once | ORAL | Status: AC
  Administered 2018-12-13: 25 mg via ORAL

## 2018-12-13 MED ORDER — DIPHENHYDRAMINE HCL 25 MG PO CAPS
ORAL_CAPSULE | ORAL | Status: AC
  Filled 2018-12-13: qty 1

## 2018-12-13 MED ORDER — DIPHENHYDRAMINE HCL 50 MG/ML IJ SOLN
25.0000 mg | INTRAMUSCULAR | Status: AC
  Administered 2018-12-13: 25 mg via INTRAMUSCULAR

## 2018-12-13 MED ORDER — DIPHENHYDRAMINE HCL 50 MG/ML IJ SOLN
INTRAMUSCULAR | Status: AC
  Filled 2018-12-13: qty 1

## 2018-12-13 NOTE — ED Triage Notes (Signed)
Patient presents to Urgent Care with complaints of allergic reaction since a little over three weeks ago. Patient reports it started with a rash and then this morning her upper lip was really swollen. Pt takes enalapril, denies itching or swelling to her airway, controlling secretions.

## 2018-12-13 NOTE — ED Provider Notes (Signed)
[facility]    CSN: [id number] Arrival date & time: 12/13/18  1242      History   Chief Complaint Chief Complaint  Patient presents with  . Oral Swelling  . Allergic Reaction    HPI Katrina Carrillo is a 53 y.o. female.   Patient noted onset last night of swelling in her lips.  She has a history of congestive heart failure is on multiple medications for that reason.  1 of her medicines is enalapril.  She continued enalapril this morning and took some Benadryl.  She denies any shortness of breath swelling of her tongue or neck or wheezing.  HPI  Past Medical History:  Diagnosis Date  . Allergy   . Asthma    right knee   . CHF (congestive heart failure) (HCC)    cardiomyopathy  . Diverticulosis   . Family history of malignant neoplasm of gastrointestinal tract 02/17/2012   Father with colon cancer age 43   . GASTROESOPHAGEAL REFLUX DISEASE 02/22/2009   Qualifier: Diagnosis of  By: Ronnald Ramp, CNA, Deborah    . GERD (gastroesophageal reflux disease)    pt states doesnt have recently   . Hypertension   . Nonischemic cardiomyopathy ([place])    [place] 01/2009: Normal coronary arteries, EF 15%. Last echo 09/2010: EF 123456, grade 2 diastolic dysfunction, trivial MR, mild LAE.8/14 EF 40-45%   . Obesity (BMI 30-39.9) 12/07/2015  . OSA (obstructive sleep apnea) 06/12/2015   Mild OSA with total AHI 8/hr and in REM sleep 26/hr with oxygen desaturations as low as 70% with respiratory events.    . OSA (obstructive sleep apnea)    use CPAP nightly  . Seasonal allergies   . Sleep apnea    wears cpap    Patient Active Problem List   Diagnosis Date Noted  . Menopause present 01/20/2018  . SI (sacroiliac) joint dysfunction 05/06/2017  . Lumbar radiculopathy 05/06/2017  . Obesity (BMI 30-39.9) 12/07/2015  . OSA (obstructive sleep apnea) 06/12/2015  . Persistent asthma 01/23/2015  . Allergic rhinoconjunctivitis, seasonal and perennial 01/29/2014  . Nonischemic cardiomyopathy  ([place]) 08/12/2012  . 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 CHRONIC SYSTOLIC HEART FAILURE [id number]  . Essential hypertension 02/22/2009  . LIVER HEMANGIOMA 02/15/2009    Past Surgical History:  Procedure Laterality Date  . COLONOSCOPY    . POLYSOMNOGRAPHY 4 OR MORE PARAMETERS  08/03/2015  . TUBAL LIGATION     S/P    OB History   No obstetric history on file.      Home Medications    Prior to Admission medications   Medication Sig Start Date End Date Taking? Authorizing Provider  enalapril (VASOTEC) 5 MG tablet Take 1 tablet (5 mg total) by mouth 2 (two) times daily. 12/19/17  Yes Fay Records, MD  albuterol (PROAIR HFA) 108 (90 Base) MCG/ACT inhaler ProAir HFA 90 mcg/actuation aerosol inhaler  inhale 2 puffs by mouth every 4 hours if needed for cough or whee...  (REFER TO PRESCRIPTION NOTES).    [provider]  amoxicillin (AMOXIL) 500 MG capsule Take 500 mg by mouth as needed (for dental visits).     [provider]  carvedilol (COREG) 6.25 MG tablet Take 2 tablets (12.5 mg total) by mouth every 12 (twelve) hours. 12/19/17   Fay Records, MD  EPINEPHrine (EPIPEN 2-PAK) 0.3 mg/0.3 mL IJ SOAJ injection EpiPen 2-Pak 0.3 mg/0.3 mL injection, auto-injector    [provider]  furosemide (LASIX) 40 MG tablet Take 1 tablet (  40 mg total) by mouth 2 (two) times daily. 08/20/18   Bhagat, Crista Luria, PA  hydrALAZINE (APRESOLINE) 25 MG tablet TAKE 1 TABLET BY MOUTH TWICE DAILY(PLEASE MAKE APPT WITH DOC. ROSS BEFORE ANYMORE REFILLS) 10/08/18   Fay Records, MD  isosorbide mononitrate (IMDUR) 30 MG 24 hr tablet Take 1 tablet (30 mg total) by mouth daily. 08/20/18   Bhagat, Crista Luria, PA  loratadine (CLARITIN) 10 MG tablet Take 10 mg by mouth daily.    [provider]  Multiple Vitamin (MULTIVITAMIN) tablet Take 1 tablet by mouth daily.      [provider]  Multiple Vitamins-Minerals (THRIVE FOR LIFE WOMENS) TABS Take 1 tablet by mouth daily.    [provider]  NASONEX 50 MCG/ACT nasal spray Place 2 sprays into both nostrils as needed.  03/14/14   [provider]  potassium chloride SA (K-DUR) 20 MEQ tablet Take 2 tablets (40 mEq total) by mouth 2 (two) times daily. 08/20/18   Bhagat, Crista Luria, PA  TURMERIC PO Take by mouth 2 (two) times daily.    [provider]  UNABLE TO FIND Qvar 80 mcg/actuation Metered Aerosol oral inhaler  inhale 2 puffs by mouth twice a day TO PREVENT COUGH OR WHEEZE...RINSE,GARGLE AND SPIT AFTER USE    [provider]  VITAMIN D PO Take 1 tablet by mouth daily.    [provider]    Family History Family History  Problem Relation Age of Onset  . Coronary artery disease Father   . Colon cancer Father 55  . Cancer Father   . Diabetes Mother   . Cancer Mother        vulvular cancer  . Diabetes Maternal Grandmother   . Cancer Paternal Grandmother   . Mental retardation Son        TBI during birth  . Hypertension Other   . Stomach cancer Neg Hx   . Colon polyps Neg Hx   . Rectal cancer Neg Hx     Social History Social History   Tobacco Use  . Smoking status: Former Smoker    Years: 2.00    Types: Cigarettes    Quit date: 04/01/1988    Years since quitting: 30.7  . Smokeless tobacco: Never Used  Substance Use Topics  . Alcohol use: Yes    Comment: social  . Drug use: No     Allergies   Peanut-containing drug products, Shellfish allergy, and Iodine   Review of Systems Review of Systems  Constitutional: Negative.   HENT: Negative for trouble swallowing.   Eyes: Negative.   Respiratory: Positive for chest tightness and wheezing. Negative for cough, choking and shortness of breath.   Cardiovascular: Negative.   Neurological: Negative.   Psychiatric/Behavioral: Negative.      Physical Exam Triage Vital Signs ED Triage Vitals  Enc Vitals Group     BP 12/13/18 1322 127/81     Pulse Rate 12/13/18 1322 91     Resp 12/13/18 1322 16     Temp  12/13/18 1322 98.1 F (36.7 C)     Temp Source 12/13/18 1322 Oral     SpO2 12/13/18 1322 99 %     Weight --      Height --      Head Circumference --      Peak Flow --      Pain Score 12/13/18 1320 0     Pain Loc --      Pain Edu? --  Excl. in GC? --    No data found.  Updated Vital Signs BP 127/81 (BP Location: Left Arm)   Pulse 91   Temp 98.1 F (36.7 C) (Oral)   Resp 16   SpO2 99%   Visual Acuity Right Eye Distance:   Left Eye Distance:   Bilateral Distance:    Right Eye Near:   Left Eye Near:    Bilateral Near:     Physical Exam Constitutional:      General: She is not in acute distress.    Appearance: Normal appearance.  HENT:     Head: Normocephalic.     Mouth/Throat:     Mouth: Mucous membranes are moist.     Comments: Lips are swollen but there is no swelling of the tongue or epiglottis or uvula. Cardiovascular:     Rate and Rhythm: Normal rate and regular rhythm.  Pulmonary:     Effort: Pulmonary effort is normal.     Breath sounds: Normal breath sounds.  Neurological:     General: No focal deficit present.     Mental Status: She is alert and oriented to person, place, and time.      UC Treatments / Results  Labs (all labs ordered are listed, but only abnormal results are displayed) Labs Reviewed - No data to display  EKG   Radiology No results found.  Procedures Procedures (including critical care time)  Medications Ordered in UC Medications - No data to display  Initial Impression / Assessment and Plan / UC Course  I have reviewed the triage vital signs and the nursing notes.  Pertinent labs & imaging results that were available during my care of the patient were reviewed by me and considered in my medical decision making (see chart for details).     Swelling of lips probably represents angioneurotic edema.  I have asked her to discontinue the enalapril until she speaks with her cardiologist tomorrow.  May take Benadryl every  6 hours today Final Clinical Impressions(s) / UC Diagnoses   Final diagnoses:  None   Discharge Instructions   None    ED Prescriptions    None     Controlled Substance Prescriptions [place] Controlled Substance Registry consulted? No   Wardell Honour, MD 12/13/18 1406

## 2018-12-13 NOTE — Discharge Instructions (Signed)
Follow-up with cardiologist tomorrow

## 2018-12-14 ENCOUNTER — Telehealth: Payer: Self-pay | Admitting: Internal Medicine

## 2018-12-14 NOTE — Telephone Encounter (Signed)
Reviewed Would put enalopril as allergy Increase hydralazine to 50 mg bid

## 2018-12-14 NOTE — Telephone Encounter (Signed)
Called patient to advise to increase hydralazine.  Pt would prefer to speak/have a visit with Dr. Harrington Challenger re: medicine change. She knows she is taking ACE and beta blocker for her heart and wants to know how would increasing hydralazine help or be a replacement.  Appointment given for this Friday 9/18 with Dr. Harrington Challenger 8am.  Pt appreciative for this.

## 2018-12-14 NOTE — Telephone Encounter (Signed)
Pt was at Urgent Care yesterday treated for angioedema. Was instructed to not take any further vasotec and contact cardiology today.  Telehealth visit 07/2018 with APP.  with plan for 6 month follow up.  She is not yet scheduled.   Will route to Dr. Harrington Challenger for recommendations and then call patient back.

## 2018-12-14 NOTE — Telephone Encounter (Signed)
Pt c/o medication issue:  1. Name of Medication: enalapril (VASOTEC) 5 MG tablet  2. How are you currently taking this medication (dosage and times per day)? Take 1 tablet (5 mg total) by mouth 2 (two) times daily.  3. Are you having a reaction (difficulty breathing--STAT)? Take 1 tablet (5 mg total) by mouth 2 (two) times daily.  4. What is your medication issue? Her lips swelling she had to go to urgent care. She has stopped taking it, so she wants to know what is going to be put in place to it.

## 2018-12-18 ENCOUNTER — Ambulatory Visit: Payer: BC Managed Care – PPO | Admitting: Internal Medicine

## 2018-12-18 ENCOUNTER — Encounter: Payer: Self-pay | Admitting: Internal Medicine

## 2018-12-18 ENCOUNTER — Other Ambulatory Visit: Payer: Self-pay

## 2018-12-18 VITALS — BP 112/66 | HR 88 | Ht 63.0 in | Wt 208.4 lb

## 2018-12-18 DIAGNOSIS — I428 Other cardiomyopathies: Secondary | ICD-10-CM

## 2018-12-18 DIAGNOSIS — I10 Essential (primary) hypertension: Secondary | ICD-10-CM | POA: Diagnosis not present

## 2018-12-18 DIAGNOSIS — I5022 Chronic systolic (congestive) heart failure: Secondary | ICD-10-CM | POA: Diagnosis not present

## 2018-12-18 MED ORDER — PREDNISONE 10 MG PO TABS: ORAL_TABLET | ORAL | 0 refills | Status: DC

## 2018-12-18 NOTE — Patient Instructions (Addendum)
  Medication Instructions:  Your physician recommends that you continue on your current medications as directed. Please refer to the Current Medication list given to you today.  If you need a refill on your cardiac medications before your next appointment, please call your pharmacy.   Lab work: Your physician recommends that you return for lab work today BMP LIPIDS PRO BNP  If you have labs (blood work) drawn today and your tests are completely normal, you will receive your results only by: Marland Kitchen MyChart Message (if you have MyChart) OR . A paper copy in the mail If you have any lab test that is abnormal or we need to change your treatment, we will call you to review the results.  Testing/Procedures: None Ordered  Follow-Up: At [facility], you and your health needs are our priority.  As part of our continuing mission to provide you with exceptional heart care, we have created designated Provider Care Teams.  These Care Teams include your primary Cardiologist (physician) and Advanced Practice Providers (APPs -  Physician Assistants and Nurse Practitioners) who all work together to provide you with the care you need, when you need it. You will need a follow up appointment in:  6 months.  Please call our office 2 months in advance to schedule this appointment.  You may see Dorris Carnes, MD or one of the following Advanced Practice Providers on your designated Care Team: Richardson Dopp, PA-C [place], [place] . Daune Perch, NP   Addendum: per Dr. Harrington Challenger, I called the patient and informed her that Dr. Harrington Challenger ordered a 10 day course of prednisone and that it has been sent to [facility]. MW 12/18/18 5:12 pm

## 2018-12-18 NOTE — Progress Notes (Signed)
Cardiology Office Note   Date:  12/18/2018   ID:  Katrina Carrillo, Katrina Carrillo 23-Jan-1966, MRN [id number]  PCP:  Carol Ada, MD  Cardiologist:   Dorris Carnes, MD   F/U of mild LV dysfunction and HTN      History of Present Illness: Katrina Carrillo is a 53 y.o. female with a history of NICM, HTN.  Normal coronary arteries at cath in 2010  LVEF 15%  Last echo in Oct 2019 40 to 45%  She also has history of HTN  I saw her in 2019     The pt  went to ED on 12/13/2018 with swelling, angioedemia      ACE inhibitor stopped    The pt says 3 wks prior to ED visit she had hives in arms   Seen by Derrm     Also had wooziness Then woke up one morning   Got out of shower   Lips were  swollen    No swelling in throat  No wheezing     Outpatient Medications Prior to Visit  Medication Sig Dispense Refill  . albuterol (PROAIR HFA) 108 (90 Base) MCG/ACT inhaler ProAir HFA 90 mcg/actuation aerosol inhaler  inhale 2 puffs by mouth every 4 hours if needed for cough or whee...  (REFER TO PRESCRIPTION NOTES).    Marland Kitchen amoxicillin (AMOXIL) 500 MG capsule Take 500 mg by mouth as needed (for dental visits).     . carvedilol (COREG) 6.25 MG tablet Take 2 tablets (12.5 mg total) by mouth every 12 (twelve) hours. 120 tablet 11  . EPINEPHrine (EPIPEN 2-PAK) 0.3 mg/0.3 mL IJ SOAJ injection EpiPen 2-Pak 0.3 mg/0.3 mL injection, auto-injector    . furosemide (LASIX) 40 MG tablet Take 1 tablet (40 mg total) by mouth 2 (two) times daily. 180 tablet 3  . hydrALAZINE (APRESOLINE) 25 MG tablet TAKE 1 TABLET BY MOUTH TWICE DAILY(PLEASE MAKE APPT WITH DOC. Mehran Guderian BEFORE ANYMORE REFILLS) 180 tablet 2  . isosorbide mononitrate (IMDUR) 30 MG 24 hr tablet Take 1 tablet (30 mg total) by mouth daily. 90 tablet 3  . loratadine (CLARITIN) 10 MG tablet Take 10 mg by mouth daily.    Marland Kitchen NASONEX 50 MCG/ACT nasal spray Place 2 sprays into both nostrils as needed.   0  . potassium chloride SA (K-DUR) 20 MEQ tablet Take 2 tablets (40  mEq total) by mouth 2 (two) times daily. 180 tablet 3  . TURMERIC PO Take by mouth 2 (two) times daily.    Marland Kitchen UNABLE TO FIND Qvar 80 mcg/actuation Metered Aerosol oral inhaler  inhale 2 puffs by mouth twice a day TO PREVENT COUGH OR WHEEZE...RINSE,GARGLE AND SPIT AFTER USE    . VITAMIN D PO Take 1 tablet by mouth daily.    . enalapril (VASOTEC) 5 MG tablet Take 1 tablet (5 mg total) by mouth 2 (two) times daily. (Patient not taking: Reported on 12/18/2018) 60 tablet 11  . Multiple Vitamin (MULTIVITAMIN) tablet Take 1 tablet by mouth daily.      . Multiple Vitamins-Minerals (THRIVE FOR LIFE WOMENS) TABS Take 1 tablet by mouth daily.     Facility-Administered Medications Prior to Visit  Medication Dose Route Frequency Provider Last Rate Last Dose  . 0.9 %  sodium chloride infusion  500 mL Intravenous Once Nandigam, Venia Minks, MD         Allergies:   Peanut-containing drug products, Shellfish allergy, Vasotec [enalapril], and Iodine   Past Medical History:  Diagnosis  Date  . Allergy   . Asthma    right knee   . CHF (congestive heart failure) (HCC)    cardiomyopathy  . Diverticulosis   . Family history of malignant neoplasm of gastrointestinal tract 02/17/2012   Father with colon cancer age 68   . GASTROESOPHAGEAL REFLUX DISEASE 02/22/2009   Qualifier: Diagnosis of  By: Ronnald Ramp, CNA, Deborah    . GERD (gastroesophageal reflux disease)    pt states doesnt have recently   . Hypertension   . Nonischemic cardiomyopathy ([place])    [place] 01/2009: Normal coronary arteries, EF 15%. Last echo 09/2010: EF 123456, grade 2 diastolic dysfunction, trivial MR, mild LAE.8/14 EF 40-45%   . Obesity (BMI 30-39.9) 12/07/2015  . OSA (obstructive sleep apnea) 06/12/2015   Mild OSA with total AHI 8/hr and in REM sleep 26/hr with oxygen desaturations as low as 70% with respiratory events.    . OSA (obstructive sleep apnea)    use CPAP nightly  . Seasonal allergies   . Sleep apnea    wears cpap    Past Surgical  History:  Procedure Laterality Date  . COLONOSCOPY    . POLYSOMNOGRAPHY 4 OR MORE PARAMETERS  08/03/2015  . TUBAL LIGATION     S/P     Social History:  The patient  reports that she quit smoking about 30 years ago. Her smoking use included cigarettes. She quit after 2.00 years of use. She has never used smokeless tobacco. She reports current alcohol use. She reports that she does not use drugs.   Family History:  The patient's family history includes Cancer in her father, mother, and paternal grandmother; Colon cancer (age of onset: 64) in her father; Coronary artery disease in her father; Diabetes in her maternal grandmother and mother; Hypertension in an other family member; Mental retardation in her son.    ROS:  Please see the history of present illness. All other systems are reviewed and  Negative to the above problem except as noted.    PHYSICAL EXAM: VS:  BP 112/66   Pulse 88   Ht 5\' 3"  (1.6 m)   Wt 208 lb 6.4 oz (94.5 kg)   SpO2 95%   BMI 36.92 kg/m   GEN: Obese 53 yo , in no acute distress  HEENT: normal  Neck: JVP is not elev   No , carotid bruits  Cardiac: RRR; no murmurs, rubs, or gallops,no edema  Respiratory:  clear to auscultation bilaterally, normal work of breathing  No wheezes GI: soft, nontender, nondistended, + BS  No hepatomegaly  MS: no deformity Moving all extremities   Skin: warm and dry, no rash Neuro:  Strength and sensation are intact Psych: euthymic mood, full affect   EKG:  EKG is ordered today  SB 58 bpm   Lipid Panel    Component Value Date/Time   CHOL 194 12/19/2017 1230   TRIG 77 12/19/2017 1230   HDL 58 12/19/2017 1230   CHOLHDL 3.3 12/19/2017 1230   CHOLHDL 3.2 03/13/2015 0915   VLDL 12 03/13/2015 0915   LDLCALC 121 (H) 12/19/2017 1230   LDLDIRECT 147.2 03/04/2013 1004      Wt Readings from Last 3 Encounters:  12/18/18 208 lb 6.4 oz (94.5 kg)  08/20/18 208 lb (94.3 kg)  12/19/17 207 lb 12.8 oz (94.3 kg)      ASSESSMENT AND  PLAN:  1  Chronic systolic CHF Volume status is OK    Keep on same regimen   I would  not push meds for now  Off of ACE I      2  HTN  BP is controlled even with dropping ACE I   She may still have effect from allergic reaction   Keep track of BP at home   3   Immune    Pt seen in ortho today for knee   Plan for dose pack     Will put slow taper    3  HCM  Check lipids     Will check BMET, BNP and LIpids today  Signed, Dorris Carnes, MD  12/18/2018 8:29 AM    [address], [place], [place]  [address] Phone: [phone]; Fax: [phone]

## 2018-12-19 LAB — BASIC METABOLIC PANEL
BUN/Creatinine Ratio: 13 (ref 9–23)
BUN: 10 mg/dL (ref 6–24)
CO2: 25 mmol/L (ref 20–29)
Calcium: 9.2 mg/dL (ref 8.7–10.2)
Chloride: 102 mmol/L (ref 96–106)
Creatinine, Ser: 0.8 mg/dL (ref 0.57–1.00)
GFR calc Af Amer: 97 mL/min/{1.73_m2} (ref >59)
GFR calc non Af Amer: 84 mL/min/{1.73_m2} (ref >59)
Glucose: 104 mg/dL — ABNORMAL HIGH (ref 65–99)
Potassium: 4.1 mmol/L (ref 3.5–5.2)
Sodium: 142 mmol/L (ref 134–144)

## 2018-12-19 LAB — LIPID PANEL
Chol/HDL Ratio: 3.5 ratio (ref 0.0–4.4)
Cholesterol, Total: 178 mg/dL (ref 100–199)
HDL: 51 mg/dL (ref >39)
LDL Chol Calc (NIH): 113 mg/dL — ABNORMAL HIGH (ref 0–99)
Triglycerides: 75 mg/dL (ref 0–149)
VLDL Cholesterol Cal: 14 mg/dL (ref 5–40)

## 2018-12-19 LAB — PRO B NATRIURETIC PEPTIDE: NT-Pro BNP: 41 pg/mL (ref 0–249)

## 2018-12-24 ENCOUNTER — Ambulatory Visit: Payer: BC Managed Care – PPO | Admitting: Allergy

## 2018-12-25 ENCOUNTER — Ambulatory Visit: Payer: BC Managed Care – PPO | Admitting: Internal Medicine

## 2018-12-28 ENCOUNTER — Other Ambulatory Visit: Payer: Self-pay

## 2018-12-28 ENCOUNTER — Ambulatory Visit
Admission: RE | Admit: 2018-12-28 | Discharge: 2018-12-28 | Disposition: A | Discharge status: Home / Self Care | Payer: BC Managed Care – PPO | Source: Ambulatory Visit | Attending: Family Medicine | Admitting: Family Medicine

## 2018-12-28 DIAGNOSIS — Z1231 Encounter for screening mammogram for malignant neoplasm of breast: Secondary | ICD-10-CM

## 2019-01-04 ENCOUNTER — Other Ambulatory Visit: Payer: Self-pay

## 2019-01-04 ENCOUNTER — Telehealth (INDEPENDENT_AMBULATORY_CARE_PROVIDER_SITE_OTHER): Payer: BC Managed Care – PPO | Admitting: Cardiology

## 2019-01-04 ENCOUNTER — Encounter: Payer: Self-pay | Admitting: Cardiology

## 2019-01-04 ENCOUNTER — Telehealth: Payer: Self-pay | Admitting: *Deleted

## 2019-01-04 VITALS — BP 128/86 | HR 85 | Ht 63.0 in

## 2019-01-04 DIAGNOSIS — G4733 Obstructive sleep apnea (adult) (pediatric): Secondary | ICD-10-CM

## 2019-01-04 DIAGNOSIS — E669 Obesity, unspecified: Secondary | ICD-10-CM | POA: Diagnosis not present

## 2019-01-04 DIAGNOSIS — I10 Essential (primary) hypertension: Secondary | ICD-10-CM | POA: Diagnosis not present

## 2019-01-04 NOTE — Telephone Encounter (Signed)
-----   Message from Sueanne Margarita, MD sent at 01/04/2019  1:56 PM EDT ----- Decrease PAP to 11cm H2O and get a download in 4 weeks.  Followup with me in 1 year

## 2019-01-04 NOTE — Telephone Encounter (Signed)
Order placed to [employer] via community message to Decrease PAP to 11cm H2O and get a download in 4 weeks.

## 2019-01-04 NOTE — Progress Notes (Signed)
Virtual Visit via Video Note   This visit type was conducted due to national recommendations for restrictions regarding the COVID-19 Pandemic (e.g. social distancing) in an effort to limit this patient's exposure and mitigate transmission in our community.  Due to her co-morbid illnesses, this patient is at least at moderate risk for complications without adequate follow up.  This format is felt to be most appropriate for this patient at this time.  All issues noted in this document were discussed and addressed.  A limited physical exam was performed with this format.  Please refer to the patient's chart for her consent to telehealth for [facility].   Evaluation Performed:  Follow-up visit  This visit type was conducted due to national recommendations for restrictions regarding the COVID-19 Pandemic (e.g. social distancing).  This format is felt to be most appropriate for this patient at this time.  All issues noted in this document were discussed and addressed.  No physical exam was performed (except for noted visual exam findings with Video Visits).  Please refer to the patient's chart (MyChart message for video visits and phone note for telephone visits) for the patient's consent to telehealth for [facility].  Date:  01/04/2019   ID:  Carrillo, Katrina [birth year], MRN [id number]  Patient Location:  Home  Provider location:   [place]  PCP:  Carol Ada, MD  Cardiologist:  Dorris Carnes, MD  Sleep Medicine:  Fransico Him, MD Electrophysiologist:  None   Chief Complaint:  OSA  History of Present Illness:    Katrina Carrillo is a 53 y.o. female who presents via audio/video conferencing for a telehealth visit today.    Katrina Carrillo is a 53 y.o. female with a hx of severe OSA with an AHI of 51/hr by PSG and is on CPAP at 12cm H2O.  She is doing well with her CPAP device and thinks that she has gotten used to it.  She tolerates the mask but has problems  with it moving around.  She just got a new mask to use tonight.  She feels the pressure is adequate.  Since going on CPAP she feels rested in the am and has no significant daytime sleepiness when she uses it.  She denies any significant mouth or nasal dryness or nasal congestion.  She does not think that he snores.    The patient does not have symptoms concerning for COVID-19 infection (fever, chills, cough, or new shortness of breath).   Prior CV studies:   The following studies were reviewed today:  PAP compliance download  Past Medical History:  Diagnosis Date  . Allergy   . Asthma    right knee   . CHF (congestive heart failure) (HCC)    cardiomyopathy  . Diverticulosis   . Family history of malignant neoplasm of gastrointestinal tract 02/17/2012   Father with colon cancer age 58   . GASTROESOPHAGEAL REFLUX DISEASE 02/22/2009   Qualifier: Diagnosis of  By: Ronnald Ramp, CNA, Deborah    . GERD (gastroesophageal reflux disease)    pt states doesnt have recently   . Hypertension   . Nonischemic cardiomyopathy ([place])    [place] 01/2009: Normal coronary arteries, EF 15%. Last echo 09/2010: EF 123456, grade 2 diastolic dysfunction, trivial MR, mild LAE.8/14 EF 40-45%   . Obesity (BMI 30-39.9) 12/07/2015  . OSA (obstructive sleep apnea) 06/12/2015   Mild OSA with total AHI 8/hr and in REM sleep 26/hr with oxygen desaturations as low as 70%  with respiratory events.    . OSA (obstructive sleep apnea)    use CPAP nightly  . Seasonal allergies   . Sleep apnea    wears cpap   Past Surgical History:  Procedure Laterality Date  . COLONOSCOPY    . POLYSOMNOGRAPHY 4 OR MORE PARAMETERS  08/03/2015  . TUBAL LIGATION     S/P     Current Meds  Medication Sig  . albuterol (PROAIR HFA) 108 (90 Base) MCG/ACT inhaler ProAir HFA 90 mcg/actuation aerosol inhaler  inhale 2 puffs by mouth every 4 hours if needed for cough or whee...  (REFER TO PRESCRIPTION NOTES).  Marland Kitchen amoxicillin (AMOXIL) 500 MG capsule Take 500  mg by mouth as needed (for dental visits).   . carvedilol (COREG) 6.25 MG tablet Take 2 tablets (12.5 mg total) by mouth every 12 (twelve) hours.  Marland Kitchen EPINEPHrine (EPIPEN 2-PAK) 0.3 mg/0.3 mL IJ SOAJ injection EpiPen 2-Pak 0.3 mg/0.3 mL injection, auto-injector  . furosemide (LASIX) 40 MG tablet Take 1 tablet (40 mg total) by mouth 2 (two) times daily.  . hydrALAZINE (APRESOLINE) 25 MG tablet TAKE 1 TABLET BY MOUTH TWICE DAILY(PLEASE MAKE APPT WITH DOC. ROSS BEFORE ANYMORE REFILLS)  . isosorbide mononitrate (IMDUR) 30 MG 24 hr tablet Take 1 tablet (30 mg total) by mouth daily.  Marland Kitchen loratadine (CLARITIN) 10 MG tablet Take 10 mg by mouth daily.  Marland Kitchen NASONEX 50 MCG/ACT nasal spray Place 2 sprays into both nostrils as needed.   . potassium chloride SA (K-DUR) 20 MEQ tablet Take 2 tablets (40 mEq total) by mouth 2 (two) times daily.  . TURMERIC PO Take by mouth 2 (two) times daily.  Marland Kitchen UNABLE TO FIND Inhale 2 sprays into the lungs as needed. qvar inhaler  . VITAMIN D PO Take 1 tablet by mouth daily.   Current Facility-Administered Medications for the 01/04/19 encounter (Telemedicine) with Sueanne Margarita, MD  Medication  . 0.9 %  sodium chloride infusion     Allergies:   Peanut-containing drug products, Shellfish allergy, Vasotec [enalapril], and Iodine   Social History   Tobacco Use  . Smoking status: Former Smoker    Years: 2.00    Types: Cigarettes    Quit date: 04/01/1988    Years since quitting: 30.7  . Smokeless tobacco: Never Used  Substance Use Topics  . Alcohol use: Yes    Comment: social  . Drug use: No     Family Hx: The patient's family history includes Cancer in her father, mother, and paternal grandmother; Colon cancer (age of onset: 13) in her father; Coronary artery disease in her father; Diabetes in her maternal grandmother and mother; Hypertension in an other family member; Mental retardation in her son. There is no history of Stomach cancer, Colon polyps, or Rectal cancer.   ROS:   Please see the history of present illness.     All other systems reviewed and are negative.   Labs/Other Tests and Data Reviewed:    Recent Labs: 12/18/2018: BUN 10; Creatinine, Ser 0.80; NT-Pro BNP 41; Potassium 4.1; Sodium 142   Recent Lipid Panel Lab Results  Component Value Date/Time   CHOL 178 12/18/2018 08:54 AM   TRIG 75 12/18/2018 08:54 AM   HDL 51 12/18/2018 08:54 AM   CHOLHDL 3.5 12/18/2018 08:54 AM   CHOLHDL 3.2 03/13/2015 09:15 AM   LDLCALC 113 (H) 12/18/2018 08:54 AM   LDLDIRECT 147.2 03/04/2013 10:04 AM    Wt Readings from Last 3 Encounters:  12/18/18 208 lb  6.4 oz (94.5 kg)  08/20/18 208 lb (94.3 kg)  12/19/17 207 lb 12.8 oz (94.3 kg)     Objective:    Vital Signs:  BP 128/86   Pulse 85   Ht 5\' 3"  (1.6 m)   BMI 36.92 kg/m    CONSTITUTIONAL:  Well nourished, well developed female in no acute distress.  EYES: anicteric MOUTH: oral mucosa is pink RESPIRATORY: Normal respiratory effort, symmetric expansion CARDIOVASCULAR: No peripheral edema SKIN: No rash, lesions or ulcers MUSCULOSKELETAL: no digital cyanosis NEURO: Cranial Nerves II-XII grossly intact, moves all extremities PSYCH: Intact judgement and insight.  A&O x 3, Mood/affect appropriate   ASSESSMENT & PLAN:    1.  OSA -  The patient is tolerating PAP therapy well without any problems. The PAP download was reviewed today and showed an AHI of 1.6/hr on 12 cm H2O with 27% compliance in using more than 4 hours nightly.  The patient has been using and benefiting from PAP use and will continue to benefit from therapy.  I have encouraged her to be more compliant with her device.  She would like the pressure lowered as she thinks it is too high so I will decrease it to 11cm H2O.   2.  HTN -BP controlled -continue Carvedilol 6.25mg  BID, Imdur 30mg  daily, Hydralazin 25mg  BID  3.  Obesity -I have encouraged her to get into a routine exercise program and cut back on carbs and portions. Her  exercise is limited due to chronic knee pain   COVID-19 Education: The signs and symptoms of COVID-19 were discussed with the patient and how to seek care for testing (follow up with PCP or arrange E-visit).  The importance of social distancing was discussed today.  Patient Risk:   After full review of this patient's clinical status, I feel that they are at least moderate risk at this time.  Time:   Today, I have spent 20 minutes directly with the patient on telemedicine discussing medical problems including OSA, HTN, obesity.  We also reviewed the symptoms of COVID 19 and the ways to protect against contracting the virus with telehealth technology.  I spent an additional 5 minutes reviewing patient's chart including PAP compliance downoad.  Medication Adjustments/Labs and Tests Ordered: Current medicines are reviewed at length with the patient today.  Concerns regarding medicines are outlined above.  Tests Ordered: No orders of the defined types were placed in this encounter.  Medication Changes: No orders of the defined types were placed in this encounter.   Disposition:  Follow up in 1 year(s)  Signed, Fransico Him, MD  01/04/2019 1:52 PM    [facility]

## 2019-01-10 ENCOUNTER — Other Ambulatory Visit: Payer: Self-pay | Admitting: Internal Medicine

## 2019-01-12 ENCOUNTER — Other Ambulatory Visit: Payer: Self-pay | Admitting: Internal Medicine

## 2019-01-12 MED ORDER — CARVEDILOL 6.25 MG PO TABS: 12.5000 mg | ORAL_TABLET | Freq: Two times a day (BID) | ORAL | 11 refills | Status: DC

## 2019-01-12 NOTE — Telephone Encounter (Signed)
Pt's medication was sent to pt's pharmacy as requested. Confirmation received.  °

## 2019-05-15 ENCOUNTER — Other Ambulatory Visit: Payer: Self-pay | Admitting: Internal Medicine

## 2019-05-15 ENCOUNTER — Other Ambulatory Visit: Payer: Self-pay | Admitting: Physician Assistant

## 2019-05-20 ENCOUNTER — Ambulatory Visit: Payer: BC Managed Care – PPO

## 2019-05-24 ENCOUNTER — Ambulatory Visit: Payer: BC Managed Care – PPO | Attending: Family

## 2019-05-24 DIAGNOSIS — Z23 Encounter for immunization: Secondary | ICD-10-CM | POA: Insufficient documentation

## 2019-05-24 NOTE — Progress Notes (Signed)
   Covid-19 Vaccination Clinic  Name:  Chayenne Straley    MRN: [id number] DOB: 1965/12/18  05/24/2019  Ms. Colberg was observed post Covid-19 immunization for 15 minutes without incidence. She was provided with Vaccine Information Sheet and instruction to access the V-Safe system.   Ms. Middlesworth was instructed to call 911 with any severe reactions post vaccine: Marland Kitchen Difficulty breathing  . Swelling of your face and throat  . A fast heartbeat  . A bad rash all over your body  . Dizziness and weakness    Immunizations Administered    Name Date Dose VIS Date Route   Moderna COVID-19 Vaccine 05/24/2019 10:25 AM 0.5 mL 03/02/2019 Intramuscular   Manufacturer: Moderna   Lot: [id number]   [id number]

## 2019-06-22 ENCOUNTER — Ambulatory Visit: Payer: BC Managed Care – PPO | Attending: Family

## 2019-06-22 DIAGNOSIS — Z23 Encounter for immunization: Secondary | ICD-10-CM

## 2019-06-22 NOTE — Progress Notes (Signed)
   Covid-19 Vaccination Clinic  Name:  Katrina Carrillo    MRN: [id number] DOB: 12/18/1965  06/22/2019  [name] was observed post Covid-19 immunization for 15 minutes without incident. She was provided with Vaccine Information Sheet and instruction to access the V-Safe system.   [name] was instructed to call 911 with any severe reactions post vaccine: Marland Kitchen Difficulty breathing  . Swelling of face and throat  . A fast heartbeat  . A bad rash all over body  . Dizziness and weakness   Immunizations Administered    Name Date Dose VIS Date Route   Moderna COVID-19 Vaccine 06/22/2019 10:44 AM 0.5 mL 03/02/2019 Intramuscular   Manufacturer: Moderna   LotMV:[id number]   [id number]

## 2019-10-12 NOTE — Progress Notes (Signed)
Cardiology Office Note    Date:  10/13/2019   ID:  Shernita, Rabinovich October 28, 1965, MRN [id number]  PCP:  Nolene Ebbs, MD  Cardiologist: Dorris Carnes, MD EPS: None  Sleep apnea: Dr. Radford Pax  Chief Complaint  Patient presents with  . Follow-up    History of Present Illness:  Katrina Carrillo is a 54 y.o. female with history of NICM EF 15% with normal coronary arteries 2010, echo 12/2017 09-73%, chronic systolic CHF,  HTN, obesity, OSA, angioedema on ACEI  Patient last saw Dr. Harrington Challenger 12/2018 and was stable. Did not want to push meds.   Patient comes in for f/u. Has gained 10 lbs. Not as active. Has tingling in her right thumb and forefinger when she lays down. 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 Denies chest pain,edema, shortness of breath  Past Medical History:  Diagnosis Date  . Allergy   . Asthma    right knee   . CHF (congestive heart failure) (HCC)    cardiomyopathy  . Diverticulosis   . Family history of malignant neoplasm of gastrointestinal tract 02/17/2012   Father with colon cancer age 19   . GASTROESOPHAGEAL REFLUX DISEASE 02/22/2009   Qualifier: Diagnosis of  By: Ronnald Ramp, CNA, Deborah    . GERD (gastroesophageal reflux disease)    pt states doesnt have recently   . Hypertension   . Nonischemic cardiomyopathy ([place])    [place] 01/2009: Normal coronary arteries, EF 15%. Last echo 09/2010: EF 53-29%, grade 2 diastolic dysfunction, trivial MR, mild LAE.8/14 EF 40-45%   . Obesity (BMI 30-39.9) 12/07/2015  . OSA (obstructive sleep apnea) 06/12/2015   Mild OSA with total AHI 8/hr and in REM sleep 26/hr with oxygen desaturations as low as 70% with respiratory events.    . OSA (obstructive sleep apnea)    use CPAP nightly  . Seasonal allergies   . Sleep apnea    wears cpap    Past Surgical History:  Procedure Laterality Date  . COLONOSCOPY    . POLYSOMNOGRAPHY 4 OR MORE PARAMETERS  08/03/2015  . TUBAL LIGATION     S/P    Current  Medications: Current Meds  Medication Sig  . albuterol (PROAIR HFA) 108 (90 Base) MCG/ACT inhaler ProAir HFA 90 mcg/actuation aerosol inhaler  inhale 2 puffs by mouth every 4 hours if needed for cough or whee...  (REFER TO PRESCRIPTION NOTES).  Marland Kitchen amoxicillin (AMOXIL) 500 MG capsule Take 500 mg by mouth as needed (for dental visits).   . carvedilol (COREG) 6.25 MG tablet TAKE 2 TABLETS BY MOUTH EVERY 12 HOURS  . EPINEPHrine (EPIPEN 2-PAK) 0.3 mg/0.3 mL IJ SOAJ injection EpiPen 2-Pak 0.3 mg/0.3 mL injection, auto-injector  . furosemide (LASIX) 40 MG tablet Take 1 tablet (40 mg total) by mouth 2 (two) times daily.  . hydrALAZINE (APRESOLINE) 25 MG tablet TAKE 1 TABLET BY MOUTH TWICE DAILY(PLEASE MAKE APPT WITH DOC. ROSS BEFORE ANYMORE REFILLS)  . isosorbide mononitrate (IMDUR) 30 MG 24 hr tablet Take 1 tablet (30 mg total) by mouth daily.  Marland Kitchen loratadine (CLARITIN) 10 MG tablet Take 10 mg by mouth daily.  Marland Kitchen NASONEX 50 MCG/ACT nasal spray Place 2 sprays into both nostrils as needed.   . potassium chloride SA (KLOR-CON) 20 MEQ tablet TAKE 2 TABLETS BY MOUTH 2 TIMES A DAY  . TURMERIC PO Take by mouth 2 (two) times daily.  Marland Kitchen UNABLE TO FIND Inhale 2 sprays into the lungs as needed.  qvar inhaler  . VITAMIN D PO Take 1 tablet by mouth daily.   Current Facility-Administered Medications for the 10/13/19 encounter (Office Visit) with Imogene Burn, PA-C  Medication  . 0.9 %  sodium chloride infusion     Allergies:   Peanut-containing drug products, Shellfish allergy, Vasotec [enalapril], and Iodine   Social History   Socioeconomic History  . Marital status: Single    Spouse name: n/a  . Number of children: 1  . Years of education: Not on file  . Highest education level: Not on file  Occupational History  . 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 Occupation: Facilities manager: [employer]  Tobacco Use  . Smoking status:  Former Smoker    Years: 2.00    Types: Cigarettes    Quit date: 04/01/1988    Years since quitting: 31.5  . Smokeless tobacco: Never Used  Vaping Use  . Vaping Use: Never used  Substance and Sexual Activity  . Alcohol use: Yes    Comment: social  . Drug use: No  . Sexual activity: Not on file  Other Topics Concern  . Not on file  Social History Narrative   Lives with her son, Shanda Bumps, who has special needs.   Divorced.   Social Determinants of Health   Financial Resource Strain:   . Difficulty of Paying Living Expenses:   Food Insecurity:   . Worried About Charity fundraiser in the Last Year:   . Arboriculturist in the Last Year:   Transportation Needs:   . Film/video editor (Medical):   Marland Kitchen Lack of Transportation (Non-Medical):   Physical Activity:   . Days of Exercise per Week:   . Minutes of Exercise per Session:   Stress:   . Feeling of Stress :   Social Connections:   . Frequency of Communication with Friends and Family:   . Frequency of Social Gatherings with Friends and Family:   . Attends Religious Services:   . Active Member of Clubs or Organizations:   . Attends Archivist Meetings:   Marland Kitchen Marital Status:      Family History:  The patient's family history includes Cancer in her father, mother, and paternal grandmother; Colon cancer (age of onset: 25) in her father; Coronary artery disease in her father; Diabetes in her maternal grandmother and mother; Hypertension in an other family member; Mental retardation in her son.   ROS:   Please see the history of present illness.    ROS All other systems reviewed and are negative.   PHYSICAL EXAM:   VS:  BP 116/86   Pulse 77   Ht 5\' 3"  (1.6 m)   Wt 220 lb (99.8 kg)   SpO2 98%   BMI 38.97 kg/m   Physical Exam  GEN: Obese, in no acute distress  Neck: no JVD, carotid bruits, or masses Cardiac:RRR; no murmurs, rubs, or gallops  Respiratory:  clear to auscultation bilaterally, normal work of  breathing GI: soft, nontender, nondistended, + BS Ext: trace ankle edema without cyanosis, clubbing, Good distal pulses bilaterally.  Right finger tingling is reproducible by compressing carpal on wrist Neuro:  Alert and Oriented x 3 Psych: euthymic mood, full affect  Wt Readings from Last 3 Encounters:  10/13/19 220 lb (99.8 kg)  12/18/18 208 lb 6.4 oz (94.5 kg)  08/20/18 208 lb (94.3 kg)  Studies/Labs Reviewed:   EKG:  EKG is  ordered today.  The ekg ordered today demonstrates NSR normal EKG  Recent Labs: 12/18/2018: BUN 10; Creatinine, Ser 0.80; NT-Pro BNP 41; Potassium 4.1; Sodium 142   Lipid Panel    Component Value Date/Time   CHOL 178 12/18/2018 0854   TRIG 75 12/18/2018 0854   HDL 51 12/18/2018 0854   CHOLHDL 3.5 12/18/2018 0854   CHOLHDL 3.2 03/13/2015 0915   VLDL 12 03/13/2015 0915   LDLCALC 113 (H) 12/18/2018 0854   LDLDIRECT 147.2 03/04/2013 1004    Additional studies/ records that were reviewed today include:  Echo 01/20/18 Study Conclusions   - Left ventricle: The cavity size was normal. Wall thickness was    normal. Systolic function was moderately reduced. The estimated    ejection fraction was in the range of 35% to 40%. Diffuse    hypokinesis. Features are consistent with a pseudonormal left    ventricular filling pattern, with concomitant abnormal relaxation    and increased filling pressure (grade 2 diastolic dysfunction).      ASSESSMENT:    1. NICM (nonischemic cardiomyopathy) ([place])   2. Chronic systolic CHF (congestive heart failure) ([place])   3. Essential hypertension   4. Obesity (BMI 30-39.9)   5. Finger numbness      PLAN:  In order of problems listed above:  NICM EF 15% with normal coronaries 2010 improved to 35-40% on echo 2019, 10 lb weight gain and trace of ankle edema but no frank CHF on exam.  Continue Coreg, Lasix, hydralazine, Imdur and potassium.  Check surveillance labs today.   angioedema on lisinorpil  Chronic  systolic CHF overall compensated.  Trace of ankle edema.  Check BNP  HTN blood pressure controlled  obesity has had trouble losing weight.  Would like referred to medical weight loss center.  Finger tingling suspect carpal tunnel syndrome due to repetitive computer work.  Follow-up with PCP  Medication Adjustments/Labs and Tests Ordered: Current medicines are reviewed at length with the patient today.  Concerns regarding medicines are outlined above.  Medication changes, Labs and Tests ordered today are listed in the Patient Instructions below. Patient Instructions  Medication Instructions:  Your physician recommends that you continue on your current medications as directed. Please refer to the Current Medication list given to you today.  *If you need a refill on your cardiac medications before your next appointment, please call your pharmacy*   Lab Work: CMET, CBC, BNP, Lipids Today  If you have labs (blood work) drawn today and your tests are completely normal, you will receive your results only by: Marland Kitchen MyChart Message (if you have MyChart) OR . A paper copy in the mail If you have any lab test that is abnormal or we need to change your treatment, we will call you to review the results.   Testing/Procedures: None   Follow-Up: At [facility], you and your health needs are our priority.  As part of our continuing mission to provide you with exceptional heart care, we have created designated Provider Care Teams.  These Care Teams include your primary Cardiologist (physician) and Advanced Practice Providers (APPs -  Physician Assistants and Nurse Practitioners) who all work together to provide you with the care you need, when you need it.  We recommend signing up for the patient portal called "MyChart".  Sign up information is provided on this After Visit Summary.  MyChart is used to connect with patients for Virtual Visits (Telemedicine).  Patients  are able to view lab/test results,  encounter notes, upcoming appointments, etc.  Non-urgent messages can be sent to your provider as well.   To learn more about what you can do with MyChart, go to [email or web address].    Your next appointment:   6 month(s)  The format for your next appointment:   In Person  Provider:   You may see Dorris Carnes, MD or one of the following Advanced Practice Providers on your designated Care Team:    Richardson Dopp, PA-C  Robbie Lis, [place]    Other Instructions You have been referred to Medical Weight Management. They will contact you to schedule an appointment.      Sumner Boast, PA-C  10/13/2019 12:08 PM    [facility] [address], [place], [place]  [address] Phone: [phone]; Fax: [phone]

## 2019-10-13 ENCOUNTER — Ambulatory Visit: Payer: BC Managed Care – PPO | Admitting: Physician Assistant

## 2019-10-13 ENCOUNTER — Encounter: Payer: Self-pay | Admitting: Physician Assistant

## 2019-10-13 ENCOUNTER — Encounter (INDEPENDENT_AMBULATORY_CARE_PROVIDER_SITE_OTHER): Payer: Self-pay

## 2019-10-13 ENCOUNTER — Other Ambulatory Visit: Payer: Self-pay

## 2019-10-13 VITALS — BP 116/86 | HR 77 | Ht 63.0 in | Wt 220.0 lb

## 2019-10-13 DIAGNOSIS — I5022 Chronic systolic (congestive) heart failure: Secondary | ICD-10-CM | POA: Diagnosis not present

## 2019-10-13 DIAGNOSIS — I10 Essential (primary) hypertension: Secondary | ICD-10-CM

## 2019-10-13 DIAGNOSIS — E669 Obesity, unspecified: Secondary | ICD-10-CM | POA: Diagnosis not present

## 2019-10-13 DIAGNOSIS — I428 Other cardiomyopathies: Secondary | ICD-10-CM | POA: Diagnosis not present

## 2019-10-13 DIAGNOSIS — R200 Anesthesia of skin: Secondary | ICD-10-CM

## 2019-10-13 NOTE — Addendum Note (Signed)
Addended by: Jacinta Shoe on: 10/13/2019 04:30 PM   Modules accepted: Orders

## 2019-10-13 NOTE — Patient Instructions (Addendum)
Medication Instructions:  Your physician recommends that you continue on your current medications as directed. Please refer to the Current Medication list given to you today.  *If you need a refill on your cardiac medications before your next appointment, please call your pharmacy*   Lab Work: CMET, CBC, BNP, Lipids Today  If you have labs (blood work) drawn today and your tests are completely normal, you will receive your results only by: Marland Kitchen MyChart Message (if you have MyChart) OR . A paper copy in the mail If you have any lab test that is abnormal or we need to change your treatment, we will call you to review the results.   Testing/Procedures: None   Follow-Up: At [facility], you and your health needs are our priority.  As part of our continuing mission to provide you with exceptional heart care, we have created designated Provider Care Teams.  These Care Teams include your primary Cardiologist (physician) and Advanced Practice Providers (APPs -  Physician Assistants and Nurse Practitioners) who all work together to provide you with the care you need, when you need it.  We recommend signing up for the patient portal called "MyChart".  Sign up information is provided on this After Visit Summary.  MyChart is used to connect with patients for Virtual Visits (Telemedicine).  Patients are able to view lab/test results, encounter notes, upcoming appointments, etc.  Non-urgent messages can be sent to your provider as well.   To learn more about what you can do with MyChart, go to [email or web address].    Your next appointment:   6 month(s)  The format for your next appointment:   In Person  Provider:   You may see Dorris Carnes, MD or one of the following Advanced Practice Providers on your designated Care Team:    Richardson Dopp, PA-C  Robbie Lis, [place]    Other Instructions You have been referred to Medical Weight Management. They will contact you to schedule an appointment.

## 2019-10-14 LAB — LIPID PANEL
Chol/HDL Ratio: 3.5 ratio (ref 0.0–4.4)
Cholesterol, Total: 207 mg/dL — ABNORMAL HIGH (ref 100–199)
HDL: 60 mg/dL (ref >39)
LDL Chol Calc (NIH): 126 mg/dL — ABNORMAL HIGH (ref 0–99)
Triglycerides: 117 mg/dL (ref 0–149)
VLDL Cholesterol Cal: 21 mg/dL (ref 5–40)

## 2019-10-14 LAB — COMPREHENSIVE METABOLIC PANEL
ALT: 18 IU/L (ref 0–32)
AST: 17 IU/L (ref 0–40)
Albumin/Globulin Ratio: 1.3 (ref 1.2–2.2)
Albumin: 4.3 g/dL (ref 3.8–4.9)
Alkaline Phosphatase: 82 IU/L (ref 48–121)
BUN/Creatinine Ratio: 11 (ref 9–23)
BUN: 8 mg/dL (ref 6–24)
Bilirubin Total: <0.2 mg/dL (ref 0.0–1.2)
CO2: 25 mmol/L (ref 20–29)
Calcium: 9.5 mg/dL (ref 8.7–10.2)
Chloride: 101 mmol/L (ref 96–106)
Creatinine, Ser: 0.75 mg/dL (ref 0.57–1.00)
GFR calc Af Amer: 105 mL/min/{1.73_m2} (ref >59)
GFR calc non Af Amer: 91 mL/min/{1.73_m2} (ref >59)
Globulin, Total: 3.4 g/dL (ref 1.5–4.5)
Glucose: 105 mg/dL — ABNORMAL HIGH (ref 65–99)
Potassium: 4.2 mmol/L (ref 3.5–5.2)
Sodium: 141 mmol/L (ref 134–144)
Total Protein: 7.7 g/dL (ref 6.0–8.5)

## 2019-10-14 LAB — CBC
Hematocrit: 43.9 % (ref 34.0–46.6)
Hemoglobin: 13.6 g/dL (ref 11.1–15.9)
MCH: 25.3 pg — ABNORMAL LOW (ref 26.6–33.0)
MCHC: 31 g/dL — ABNORMAL LOW (ref 31.5–35.7)
MCV: 82 fL (ref 79–97)
Platelets: 299 10*3/uL (ref 150–450)
RBC: 5.38 x10E6/uL — ABNORMAL HIGH (ref 3.77–5.28)
RDW: 14 % (ref 11.7–15.4)
WBC: 9.1 10*3/uL (ref 3.4–10.8)

## 2019-10-14 LAB — PRO B NATRIURETIC PEPTIDE: NT-Pro BNP: 60 pg/mL (ref 0–249)

## 2019-10-28 ENCOUNTER — Encounter (INDEPENDENT_AMBULATORY_CARE_PROVIDER_SITE_OTHER): Payer: Self-pay | Admitting: Family Medicine

## 2019-10-28 ENCOUNTER — Ambulatory Visit (INDEPENDENT_AMBULATORY_CARE_PROVIDER_SITE_OTHER): Payer: BC Managed Care – PPO | Admitting: Family Medicine

## 2019-10-28 ENCOUNTER — Other Ambulatory Visit: Payer: Self-pay

## 2019-10-28 VITALS — BP 118/80 | HR 82 | Temp 97.6°F | Ht 63.0 in | Wt 217.0 lb

## 2019-10-28 DIAGNOSIS — Z1331 Encounter for screening for depression: Secondary | ICD-10-CM | POA: Diagnosis not present

## 2019-10-28 DIAGNOSIS — Z6838 Body mass index (BMI) 38.0-38.9, adult: Secondary | ICD-10-CM

## 2019-10-28 DIAGNOSIS — Z9189 Other specified personal risk factors, not elsewhere classified: Secondary | ICD-10-CM | POA: Diagnosis not present

## 2019-10-28 DIAGNOSIS — I10 Essential (primary) hypertension: Secondary | ICD-10-CM

## 2019-10-28 DIAGNOSIS — I509 Heart failure, unspecified: Secondary | ICD-10-CM

## 2019-10-28 DIAGNOSIS — R5383 Other fatigue: Secondary | ICD-10-CM

## 2019-10-28 DIAGNOSIS — R7303 Prediabetes: Secondary | ICD-10-CM

## 2019-10-28 DIAGNOSIS — Z79899 Other long term (current) drug therapy: Secondary | ICD-10-CM | POA: Diagnosis not present

## 2019-10-28 DIAGNOSIS — G4733 Obstructive sleep apnea (adult) (pediatric): Secondary | ICD-10-CM

## 2019-10-28 DIAGNOSIS — R0602 Shortness of breath: Secondary | ICD-10-CM

## 2019-10-28 DIAGNOSIS — Z9989 Dependence on other enabling machines and devices: Secondary | ICD-10-CM

## 2019-10-28 DIAGNOSIS — Z0289 Encounter for other administrative examinations: Secondary | ICD-10-CM

## 2019-10-28 NOTE — Progress Notes (Signed)
Dear Ermalinda Barrios, PA-C,   Thank you for referring Katrina Carrillo to our clinic. The following note includes my evaluation and treatment recommendations.  Chief Complaint:   OBESITY Katrina Carrillo (MR# [id number]) is a 54 y.o. female who presents for evaluation and treatment of obesity and related comorbidities. Current BMI is Body mass index is 38.44 kg/m. [name] has been struggling with her weight for many years and has been unsuccessful in either losing weight, maintaining weight loss, or reaching her healthy weight goal.  [name] is currently in the action stage of change and ready to dedicate time achieving and maintaining a healthier weight. [name] is interested in becoming our patient and working on intensive lifestyle modifications including (but not limited to) diet and exercise for weight loss.  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  She lives with her son, Katrina Carrillo (40), who is special needs.  Her mother currently has vulvar cancer.  she snacks on pineapple, cherries, grapes.  She skips dinner daily, she says, but gets up sometimes in the middle of the night to eat.  She eats out 2 days per week.  She craves nachos.  No caloric beverages; only occasional champagne.  Positive history of gestational diabetes.  Her PCP is Dr[facility] Jeanie Cooks at [facility].  She sees him every 3 months.   Katrina Carrillo's habits were reviewed today and are as follows: Her family eats meals together, she thinks her family will eat healthier with her, her desired weight loss is 80 pounds, she started gaining weight over the last few years, her heaviest weight ever was 220 pounds, she craves nachos, she wakes up frequently in the middle of the night to eat, she skips dinner daily and she frequently eats larger portions than normal.  Depression Screen Katrina Carrillo's Food and Mood (modified PHQ-9) score was 4.  Depression screen PHQ 2/9 10/28/2019  Decreased Interest 1  Down,  Depressed, Hopeless 1  PHQ - 2 Score 2  Altered sleeping 0  Tired, decreased energy 1  Change in appetite 1  Feeling bad or failure about yourself  0  Trouble concentrating 0  Moving slowly or fidgety/restless 0  Suicidal thoughts 0  PHQ-9 Score 4  Difficult doing work/chores Not difficult at all   Subjective:   1. Other fatigue Katrina Carrillo admits to daytime somnolence and admits to waking up still tired. Patent has a history of symptoms of daytime fatigue, morning fatigue and snoring. Katrina Carrillo generally gets 6 or 7 hours of sleep per night, and states that she has generally restful sleep. Snoring is present. Apneic episodes are not present. Epworth Sleepiness Score is 8.  Katrina Carrillo has OSA and uses a CPAP machine.  2. SOB (shortness of breath) on exertion Katrina Carrillo notes increasing shortness of breath with exercising and seems to be worsening over time with weight gain. She notes getting out of breath sooner with activity than she used to. This has gotten worse recently. Katrina Carrillo denies shortness of breath at rest or orthopnea.  3. Other congestive heart failure (HCC) Katrina Carrillo sees Dr. Harrington Challenger in Cardiology, and follows her treatment plan.  She also follows with her PCP.  Denies symptoms today.  Denies concerns.  4. High risk medication use Katrina Carrillo is taking high risk medications.  5. Essential hypertension Review: taking medications as instructed, no medication side effects noted, no chest pain on exertion, no dyspnea on exertion, no swelling of ankles.  Blood pressure is at goal today.  BP Readings from  Last 3 Encounters:  10/28/19 118/80  10/13/19 116/86  01/04/19 128/86   6. Prediabetes Katrina Carrillo has a diagnosis of prediabetes based on her elevated HgA1c and was informed this puts her at greater risk of developing diabetes. She continues to work on diet and exercise to decrease her risk of diabetes. She denies nausea or hypoglycemia.  She has history of gestational diabetes.  Lab Results    Component Value Date   HGBA1C 6.1 (H) 12/19/2017   7. OSA on CPAP Katrina Carrillo has a diagnosis of sleep apnea. She reports that she is using a CPAP regularly.  She is followed by Dr. Fransico Him.  8. Depression screening Katrina Carrillo was screened for depression today as part of her new patient workup.  PHQ-9 is 4.  9. At risk for diabetes mellitus Katrina Carrillo is at higher than average risk for developing diabetes due to her obesity.   Assessment/Plan:   1. Other fatigue Katrina Carrillo does feel that her weight is causing her energy to be lower than it should be. Fatigue may be related to obesity, depression or many other causes. Labs will be ordered, and in the meanwhile, [name] will focus on self care including making healthy food choices, increasing physical activity and focusing on stress reduction. - Hemoglobin A1c - Insulin, random - VITAMIN D 25 Hydroxy (Vit-D Deficiency, Fractures) - Vitamin B12 - Folate - T3 - T4 - TSH  2. SOB (shortness of breath) on exertion Katrina Carrillo does feel that her weight is causing her energy to be lower than it should be. Fatigue may be related to obesity, depression or many other causes. Labs will be ordered, and in the meanwhile, Katrina Carrillo will focus on self care including making healthy food choices, increasing physical activity and focusing on stress reduction. - Hemoglobin A1c - Insulin, random  3. Other congestive heart failure (Katrina Carrillo) Treatment per Cardiology.  Continue prudent nutritional plan, weight loss.  4. High risk medication use Will continue to monitor closely.  5. Essential hypertension Katrina Carrillo is working on healthy weight loss and exercise to improve blood pressure control. We will watch for signs of hypotension as she continues her lifestyle modifications.  6. Prediabetes Katrina Carrillo will continue to work on weight loss, exercise, and decreasing simple carbohydrates to help decrease the risk of diabetes.   7. OSA on CPAP Intensive lifestyle modifications  are the first line treatment for this issue. We discussed several lifestyle modifications today and she will continue to work on diet, exercise and weight loss efforts. We will continue to monitor. Orders and follow up as documented in patient record.   Counseling  Sleep apnea is a condition in which breathing pauses or becomes shallow during sleep. This happens over and over during the night. This disrupts your sleep and keeps your body from getting the rest that it needs, which can cause tiredness and lack of energy (fatigue) during the day.  Sleep apnea treatment: If you were given a device to open your airway while you sleep, USE IT!  Sleep hygiene:   Limit or avoid alcohol, caffeinated beverages, and cigarettes, especially close to bedtime.   Do not eat a large meal or eat spicy foods right before bedtime. This can lead to digestive discomfort that can make it hard for you to sleep.  Keep a sleep diary to help you and your health care provider figure out what could be causing your insomnia.  . Make your bedroom a dark, comfortable place where it is easy to fall asleep. ? Put  up shades or blackout curtains to block light from outside. ? Use a white noise machine to block noise. ? Keep the temperature cool. . Limit screen use before bedtime. This includes: ? Watching TV. ? Using your smartphone, tablet, or computer. . Stick to a routine that includes going to bed and waking up at the same times every day and night. This can help you fall asleep faster. Consider making a quiet activity, such as reading, part of your nighttime routine. . Try to avoid taking naps during the day so that you sleep better at night. . Get out of bed if you are still awake after 15 minutes of trying to sleep. Keep the lights down, but try reading or doing a quiet activity. When you feel sleepy, go back to bed.  8. Depression screening Depression screen was negative today.  9. At risk for diabetes  mellitus Aurea was given approximately 15 minutes of diabetes education and counseling today. We discussed intensive lifestyle modifications today with an emphasis on weight loss as well as increasing exercise and decreasing simple carbohydrates in her diet. We also reviewed medication options with an emphasis on risk versus benefit of those discussed.   Repetitive spaced learning was employed today to elicit superior memory formation and behavioral change.  10. Class 2 severe obesity with serious comorbidity and body mass index (BMI) of 38.0 to 38.9 in adult, unspecified obesity type [facility]) Athena is currently in the action stage of change and her goal is to continue with weight loss efforts. I recommend Markala begin the structured treatment plan as follows:  She has agreed to the Category 2 Plan.  Exercise goals: As is.   Behavioral modification strategies: increasing lean protein intake, increasing water intake and no skipping meals.  She was informed of the importance of frequent follow-up visits to maximize her success with intensive lifestyle modifications for her multiple health conditions. She was informed we would discuss her lab results at her next visit unless there is a critical issue that needs to be addressed sooner. Amora agreed to keep her next visit at the agreed upon time to discuss these results.  Objective:   Blood pressure 118/80, pulse 82, temperature 97.6 F (36.4 C), height 5\' 3"  (1.6 m), weight (!) 217 lb (98.4 kg), SpO2 99 %. Body mass index is 38.44 kg/m.  Indirect Calorimeter completed today shows a VO2 of 266 and a REE of 1848.  Her calculated basal metabolic rate is 0973 thus her basal metabolic rate is better than expected.  General: Cooperative, alert, well developed, in no acute distress. HEENT: Conjunctivae and lids unremarkable. Cardiovascular: Regular rhythm.  Lungs: Normal work of breathing. Neurologic: No focal deficits.   Lab Results  Component  Value Date   CREATININE 0.75 10/13/2019   BUN 8 10/13/2019   NA 141 10/13/2019   K 4.2 10/13/2019   CL 101 10/13/2019   CO2 25 10/13/2019   Lab Results  Component Value Date   ALT 18 10/13/2019   AST 17 10/13/2019   ALKPHOS 82 10/13/2019   BILITOT <0.2 10/13/2019   Lab Results  Component Value Date   HGBA1C 6.1 (H) 12/19/2017   Lab Results  Component Value Date   TSH 1.050 04/24/2017   Lab Results  Component Value Date   CHOL 207 (H) 10/13/2019   HDL 60 10/13/2019   LDLCALC 126 (H) 10/13/2019   LDLDIRECT 147.2 03/04/2013   TRIG 117 10/13/2019   CHOLHDL 3.5 10/13/2019   Lab Results  Component Value Date   WBC 9.1 10/13/2019   HGB 13.6 10/13/2019   HCT 43.9 10/13/2019   MCV 82 10/13/2019   PLT 299 10/13/2019   Attestation Statements:   Reviewed by clinician on day of visit: allergies, medications, problem list, medical history, surgical history, family history, social history, and previous encounter notes.  I, Water quality scientist, CMA, am acting as Location manager for [employer], DO.  I have reviewed the above documentation for accuracy and completeness, and I agree with the above. Mellody Dance, DO

## 2019-11-02 LAB — TSH: TSH: 0.575 u[IU]/mL (ref 0.450–4.500)

## 2019-11-02 LAB — VITAMIN B12: Vitamin B-12: 657 pg/mL (ref 232–1245)

## 2019-11-02 LAB — T3: T3, Total: 112 ng/dL (ref 71–180)

## 2019-11-02 LAB — VITAMIN D 25 HYDROXY (VIT D DEFICIENCY, FRACTURES): Vit D, 25-Hydroxy: 24.5 ng/mL — ABNORMAL LOW (ref 30.0–100.0)

## 2019-11-02 LAB — INSULIN, RANDOM: INSULIN: 21.7 u[IU]/mL (ref 2.6–24.9)

## 2019-11-02 LAB — HEMOGLOBIN A1C
Est. average glucose Bld gHb Est-mCnc: 128 mg/dL
Hgb A1c MFr Bld: 6.1 % — ABNORMAL HIGH (ref 4.8–5.6)

## 2019-11-02 LAB — FOLATE: Folate: 18.3 ng/mL (ref >3.0)

## 2019-11-02 LAB — T4: T4, Total: 5.2 ug/dL (ref 4.5–12.0)

## 2019-11-11 ENCOUNTER — Other Ambulatory Visit: Payer: Self-pay

## 2019-11-11 ENCOUNTER — Ambulatory Visit (INDEPENDENT_AMBULATORY_CARE_PROVIDER_SITE_OTHER): Payer: BC Managed Care – PPO | Admitting: Family Medicine

## 2019-11-11 VITALS — BP 108/72 | HR 82 | Temp 98.1°F | Ht 63.0 in | Wt 218.0 lb

## 2019-11-11 DIAGNOSIS — Z9189 Other specified personal risk factors, not elsewhere classified: Secondary | ICD-10-CM

## 2019-11-11 DIAGNOSIS — E559 Vitamin D deficiency, unspecified: Secondary | ICD-10-CM | POA: Diagnosis not present

## 2019-11-11 DIAGNOSIS — E7849 Other hyperlipidemia: Secondary | ICD-10-CM | POA: Diagnosis not present

## 2019-11-11 DIAGNOSIS — I10 Essential (primary) hypertension: Secondary | ICD-10-CM

## 2019-11-11 DIAGNOSIS — R7303 Prediabetes: Secondary | ICD-10-CM

## 2019-11-11 DIAGNOSIS — Z6838 Body mass index (BMI) 38.0-38.9, adult: Secondary | ICD-10-CM

## 2019-11-11 NOTE — Patient Instructions (Signed)
The 10-year ASCVD risk score Mikey Bussing DC Brooke Bonito., et al., 2013) is: 2.4%   Values used to calculate the score:     Age: 54 years     Sex: Female     Is Non-Hispanic African American: Yes     Diabetic: No     Tobacco smoker: No     Systolic Blood Pressure: 660 mmHg     Is BP treated: Yes     HDL Cholesterol: 60 mg/dL     Total Cholesterol: 207 mg/dL

## 2019-11-15 MED ORDER — VITAMIN D (ERGOCALCIFEROL) 1.25 MG (50000 UNIT) PO CAPS: 50000.0000 [IU] | ORAL_CAPSULE | ORAL | 0 refills | Status: DC

## 2019-11-15 NOTE — Progress Notes (Signed)
Chief Complaint:   OBESITY Katrina Carrillo is here to discuss her progress with her obesity treatment plan along with follow-up of her obesity related diagnoses. Katrina Carrillo is on the Category 2 Plan and states she is following her eating plan approximately 90% of the time. Katrina Carrillo states she is riding a bike for 30 minutes 2 times per week.  Today's visit was #: 2 Starting weight: 217 lbs Starting date: 10/28/2019 Today's weight: 218 lbs Today's date: 11/11/2019 Total lbs lost to date: 0 Total lbs lost since last in-office visit: 0  Interim History: Katrina Carrillo is not weighing her proteins.  She says she has no issues with hunger or cravings.  She is eating more food now than before, but says it is still hard to get it all in.  She reports that meal prep is challenging.  Subjective:   1. Essential hypertension Review: taking medications as instructed, no medication side effects noted, no chest pain on exertion, no dyspnea on exertion, no swelling of ankles.  No issues with blood pressure or symptoms all.  She is taking Imdur, Coreg, Lasix, and hydralazine per Cardiology.  She Sees Dr. Harrington Challenger.  BP Readings from Last 3 Encounters:  11/11/19 108/72  10/28/19 118/80  10/13/19 116/86   2. Prediabetes Katrina Carrillo has a diagnosis of prediabetes based on her elevated HgA1c and was informed this puts her at greater risk of developing diabetes. She continues to work on diet and exercise to decrease her risk of diabetes. She denies nausea or hypoglycemia.  A1c is the same as 1 year ago.  Lab Results  Component Value Date   HGBA1C 6.1 (H) 11/01/2019   Lab Results  Component Value Date   INSULIN 21.7 11/01/2019   3. Other hyperlipidemia Katrina Carrillo has hyperlipidemia and has been trying to improve her cholesterol levels with intensive lifestyle modification including a low saturated fat diet, exercise and weight loss. She denies any chest pain, claudication or myalgias.  Elevated LDL.  ASCVD risk 2.4%.  Lab Results   Component Value Date   ALT 18 10/13/2019   AST 17 10/13/2019   ALKPHOS 82 10/13/2019   BILITOT <0.2 10/13/2019   Lab Results  Component Value Date   CHOL 207 (H) 10/13/2019   HDL 60 10/13/2019   LDLCALC 126 (H) 10/13/2019   LDLDIRECT 147.2 03/04/2013   TRIG 117 10/13/2019   CHOLHDL 3.5 10/13/2019   4. Vitamin D deficiency Katrina Carrillo's Vitamin D level was 24.5 on 11/01/2019. She is currently taking no vitamin D supplement. She denies nausea, vomiting or muscle weakness.  5. At risk for diabetes mellitus Katrina Carrillo is at higher than average risk for developing diabetes due to her obesity.   Assessment/Plan:   1. Essential hypertension Farah is working on healthy weight loss and exercise to improve blood pressure control. We will watch for signs of hypotension as she continues her lifestyle modifications.  Continue medications and treatment.  Blood pressure at goal.  2. Prediabetes Katrina Carrillo will continue to work on weight loss, exercise, and decreasing simple carbohydrates to help decrease the risk of diabetes.  Continue prudent nutritional plan, weight loss.  Recheck labs at 3 months.  3. Other hyperlipidemia Cardiovascular risk and specific lipid/LDL goals reviewed.  We discussed several lifestyle modifications today and Katrina Carrillo will continue to work on diet, exercise and weight loss efforts. Orders and follow up as documented in patient record.  Recheck labs in 3-4 months after intensive lifestyle change, weight loss, and prudent nutritional plan.  Counseling Intensive  lifestyle modifications are the first line treatment for this issue. . Dietary changes: Increase soluble fiber. Decrease simple carbohydrates. . Exercise changes: Moderate to vigorous-intensity aerobic activity 150 minutes per week if tolerated. . Lipid-lowering medications: see documented in medical record.  4. Vitamin D deficiency Low Vitamin D level contributes to fatigue and are associated with obesity, breast, and  colon cancer. She agrees to start to take prescription Vitamin D @50 ,000 IU every week and will follow-up for routine testing of Vitamin D, at least 2-3 times per year to avoid over-replacement.  Recheck vitamin D level in 3 months. - Vitamin D, Ergocalciferol, (DRISDOL) 1.25 MG (50000 UNIT) CAPS capsule; Take 1 capsule (50,000 Units total) by mouth every 7 (seven) days.  Dispense: 4 capsule; Refill: 0  5. At risk for diabetes mellitus Katrina Carrillo was given approximately 15 minutes of diabetes education and counseling today. We discussed intensive lifestyle modifications today with an emphasis on weight loss as well as increasing exercise and decreasing simple carbohydrates in her diet. We also reviewed medication options with an emphasis on risk versus benefit of those discussed.   Repetitive spaced learning was employed today to elicit superior memory formation and behavioral change.  6. Class 2 severe obesity with serious comorbidity and body mass index (BMI) of 38.0 to 38.9 in adult, unspecified obesity type [facility]) Katrina Carrillo is currently in the action stage of change. As such, her goal is to continue with weight loss efforts. She has agreed to the Category 2 Plan and weight proteins.   Exercise goals: Biking for 30 mintues 5 days per week.  Behavioral modification strategies: increasing lean protein intake (and weigh it), decreasing simple carbohydrates, decreasing sodium intake, decreasing eating out, no skipping meals, meal planning and cooking strategies, keeping healthy foods in the home and planning for success.  Armetta has agreed to follow-up with our clinic in 2 weeks. She was informed of the importance of frequent follow-up visits to maximize her success with intensive lifestyle modifications for her multiple health conditions.   Objective:   Blood pressure 108/72, pulse 82, temperature 98.1 F (36.7 C), height 5\' 3"  (1.6 m), weight 218 lb (98.9 kg), SpO2 96 %. Body mass index is 38.62  kg/m.  General: Cooperative, alert, well developed, in no acute distress. HEENT: Conjunctivae and lids unremarkable. Cardiovascular: Regular rhythm.  Lungs: Normal work of breathing. Neurologic: No focal deficits.   Lab Results  Component Value Date   CREATININE 0.75 10/13/2019   BUN 8 10/13/2019   NA 141 10/13/2019   K 4.2 10/13/2019   CL 101 10/13/2019   CO2 25 10/13/2019   Lab Results  Component Value Date   ALT 18 10/13/2019   AST 17 10/13/2019   ALKPHOS 82 10/13/2019   BILITOT <0.2 10/13/2019   Lab Results  Component Value Date   HGBA1C 6.1 (H) 11/01/2019   HGBA1C 6.1 (H) 12/19/2017   Lab Results  Component Value Date   INSULIN 21.7 11/01/2019   Lab Results  Component Value Date   TSH 0.575 11/01/2019   Lab Results  Component Value Date   CHOL 207 (H) 10/13/2019   HDL 60 10/13/2019   LDLCALC 126 (H) 10/13/2019   LDLDIRECT 147.2 03/04/2013   TRIG 117 10/13/2019   CHOLHDL 3.5 10/13/2019   Lab Results  Component Value Date   WBC 9.1 10/13/2019   HGB 13.6 10/13/2019   HCT 43.9 10/13/2019   MCV 82 10/13/2019   PLT 299 10/13/2019   Attestation Statements:   Reviewed  by clinician on day of visit: allergies, medications, problem list, medical history, surgical history, family history, social history, and previous encounter notes.  I, Water quality scientist, CMA, am acting as Location manager for [employer], DO.  I have reviewed the above documentation for accuracy and completeness, and I agree with the above. Mellody Dance, DO

## 2019-11-25 ENCOUNTER — Other Ambulatory Visit: Payer: Self-pay

## 2019-11-25 ENCOUNTER — Encounter (INDEPENDENT_AMBULATORY_CARE_PROVIDER_SITE_OTHER): Payer: Self-pay | Admitting: Family Medicine

## 2019-11-25 ENCOUNTER — Ambulatory Visit (INDEPENDENT_AMBULATORY_CARE_PROVIDER_SITE_OTHER): Payer: BC Managed Care – PPO | Admitting: Family Medicine

## 2019-11-25 VITALS — BP 100/40 | HR 75 | Temp 97.9°F | Ht 63.0 in | Wt 216.0 lb

## 2019-11-25 DIAGNOSIS — E559 Vitamin D deficiency, unspecified: Secondary | ICD-10-CM | POA: Diagnosis not present

## 2019-11-25 DIAGNOSIS — Z9189 Other specified personal risk factors, not elsewhere classified: Secondary | ICD-10-CM

## 2019-11-25 DIAGNOSIS — I10 Essential (primary) hypertension: Secondary | ICD-10-CM

## 2019-11-25 DIAGNOSIS — Z6838 Body mass index (BMI) 38.0-38.9, adult: Secondary | ICD-10-CM

## 2019-11-29 MED ORDER — VITAMIN D (ERGOCALCIFEROL) 1.25 MG (50000 UNIT) PO CAPS: 50000.0000 [IU] | ORAL_CAPSULE | ORAL | 0 refills | Status: DC

## 2019-11-29 NOTE — Progress Notes (Signed)
Chief Complaint:   OBESITY Katrina Carrillo is here to discuss her progress with her obesity treatment plan along with follow-up of her obesity related diagnoses. Katrina Carrillo is on the Category 2 Plan and states she is following her eating plan approximately 90-95% of the time. Katrina Carrillo states she is biking and walking for 30 minutes 5 times per week.  Today's visit was #: 3 Starting weight: 217 lbs Starting date: 10/28/2019 Today's weight: 216 lbs Today's date: 11/25/2019 Total lbs lost to date: 1 lb Total lbs lost since last in-office visit: 2 lbs  Interim History: Katrina Carrillo says she did more biking since last time and walks at least 1 mile on other days.  She recognizes her stress increased and she needed to exercise.  She had a family reunion this past weekend as well and had a [place] burger and fried fish etc.  She is eating "all her foods" and getting 10 ounces of protein in per pt  Subjective:   1. Essential hypertension Review: taking medications as instructed, no medication side effects noted, no chest pain on exertion, no dyspnea on exertion, no swelling of ankles.  Denies dizziness or lightheadedness, no signs of orthostatic hypotension.  BP Readings from Last 3 Encounters:  11/25/19 (!) 100/40  11/11/19 108/72  10/28/19 118/80   2. Vitamin D deficiency Katrina Carrillo's Vitamin D level was 24.5 on 11/01/2019. She is currently taking prescription vitamin D 50,000 IU each week. She denies nausea, vomiting or muscle weakness.  Started at last office visit.  Denies problems with medication.  3. At risk for complication associated with hypotension The patient is at a higher than average risk of hypotension due to weight loss.  Assessment/Plan:   1. Essential hypertension Katrina Carrillo is working on healthy weight loss and exercise to improve blood pressure control. We will watch for signs of hypotension as she continues her lifestyle modifications.  Start checking blood pressure at home daily and bring in  log at next office visit.  If it remains low at home, call Cardiology re: changes to meds.  2. Vitamin D deficiency Low Vitamin D level contributes to fatigue and are associated with obesity, breast, and colon cancer. She agrees to continue to take prescription Vitamin D @50 ,000 IU every week and will follow-up for routine testing of Vitamin D, at least 2-3 times per year to avoid over-replacement.    -Refill Vitamin D, Ergocalciferol, (DRISDOL) 1.25 MG (50000 UNIT) CAPS capsule; Take 1 capsule (50,000 Units total) by mouth every 7 (seven) days.  Dispense: 4 capsule; Refill: 0  3. At risk for complication associated with hypotension Katrina Carrillo was given approximately 15 minutes of education and counseling today to help avoid hypotension. We discussed risks of hypotension with weight loss and signs of hypotension such as feeling lightheaded or unsteady.  Repetitive spaced learning was employed today to elicit superior memory formation and behavioral change.  4. Class 2 severe obesity with serious comorbidity and body mass index (BMI) of 38.0 to 38.9 in adult, unspecified obesity type [facility]) Katrina Carrillo is currently in the action stage of change. As such, her goal is to continue with weight loss efforts. She has agreed to the Category 2 Plan.   Exercise goals: As is.  Behavioral modification strategies: increasing lean protein intake, no skipping meals, meal planning and cooking strategies and planning for success.  Katrina Carrillo has agreed to follow-up with our clinic in 2 weeks. She was informed of the importance of frequent follow-up visits to maximize her success with  intensive lifestyle modifications for her multiple health conditions.   Objective:   Blood pressure (!) 100/40, pulse 75, temperature 97.9 F (36.6 C), height 5\' 3"  (1.6 m), weight 216 lb (98 kg), SpO2 98 %. Body mass index is 38.26 kg/m.  General: Cooperative, alert, well developed, in no acute distress. HEENT: Conjunctivae and lids  unremarkable. Cardiovascular: Regular rhythm.  Lungs: Normal work of breathing. Neurologic: No focal deficits.   Lab Results  Component Value Date   CREATININE 0.75 10/13/2019   BUN 8 10/13/2019   NA 141 10/13/2019   K 4.2 10/13/2019   CL 101 10/13/2019   CO2 25 10/13/2019   Lab Results  Component Value Date   ALT 18 10/13/2019   AST 17 10/13/2019   ALKPHOS 82 10/13/2019   BILITOT <0.2 10/13/2019   Lab Results  Component Value Date   HGBA1C 6.1 (H) 11/01/2019   HGBA1C 6.1 (H) 12/19/2017   Lab Results  Component Value Date   INSULIN 21.7 11/01/2019   Lab Results  Component Value Date   TSH 0.575 11/01/2019   Lab Results  Component Value Date   CHOL 207 (H) 10/13/2019   HDL 60 10/13/2019   LDLCALC 126 (H) 10/13/2019   LDLDIRECT 147.2 03/04/2013   TRIG 117 10/13/2019   CHOLHDL 3.5 10/13/2019   Lab Results  Component Value Date   WBC 9.1 10/13/2019   HGB 13.6 10/13/2019   HCT 43.9 10/13/2019   MCV 82 10/13/2019   PLT 299 10/13/2019   Attestation Statements:   Reviewed by clinician on day of visit: allergies, medications, problem list, medical history, surgical history, family history, social history, and previous encounter notes.  I, Water quality scientist, CMA, am acting as Location manager for [employer], DO.  I have reviewed the above documentation for accuracy and completeness, and I agree with the above. Mellody Dance, DO

## 2019-12-08 ENCOUNTER — Other Ambulatory Visit: Payer: Self-pay

## 2019-12-08 ENCOUNTER — Other Ambulatory Visit: Payer: Self-pay | Admitting: Internal Medicine

## 2019-12-08 ENCOUNTER — Encounter (INDEPENDENT_AMBULATORY_CARE_PROVIDER_SITE_OTHER): Payer: Self-pay | Admitting: Family Medicine

## 2019-12-08 ENCOUNTER — Ambulatory Visit (INDEPENDENT_AMBULATORY_CARE_PROVIDER_SITE_OTHER): Payer: BC Managed Care – PPO | Admitting: Family Medicine

## 2019-12-08 VITALS — BP 136/93 | HR 93 | Temp 98.2°F | Ht 63.0 in | Wt 213.0 lb

## 2019-12-08 DIAGNOSIS — Z6837 Body mass index (BMI) 37.0-37.9, adult: Secondary | ICD-10-CM | POA: Diagnosis not present

## 2019-12-08 DIAGNOSIS — E559 Vitamin D deficiency, unspecified: Secondary | ICD-10-CM

## 2019-12-08 DIAGNOSIS — I10 Essential (primary) hypertension: Secondary | ICD-10-CM

## 2019-12-09 NOTE — Progress Notes (Signed)
Chief Complaint:   OBESITY Katrina Carrillo is here to discuss her progress with her obesity treatment plan along with follow-up of her obesity related diagnoses. Katrina Carrillo is on the Category 2 Plan and states she is following her eating plan approximately 80% of the time. Katrina Carrillo states she is 10,000 steps daily.  Today's visit was #: 4 Starting weight: 217 lbs Starting date: 10/28/2019 Today's weight: 213 lbs Today's date: 12/08/2019 Total lbs lost to date: 4 lbs Total lbs lost since last in-office visit: 3 lbs  Interim History: Katrina Carrillo says the last 2 weeks were better with water intake.  She says she is trying to eat 10 ounces of protein per day as well.  She weighs her chicken before cooking.  If not skipping meals and eating all proteins, she is not hungry at all.  She says she is paying more attention to how she feels; listening to hunger signals, etc.  She also went to a wedding and drank and ate sensibly - [place].  Subjective:   1. Essential hypertension Review: taking medications as instructed, no medication side effects noted, no chest pain on exertion, no dyspnea on exertion, no swelling of ankles.  Blood pressure at last visit was 100/40.  It is much better today.  She did not check her blood pressure at home, but no complaints.  On Imdur, Lasix, hydralazine, and has been out of Coreg for the past couple days.  She has a refill waiting for her.  BP Readings from Last 3 Encounters:  12/08/19 (!) 136/93  11/25/19 (!) 100/40  11/11/19 108/72   2. Vitamin D deficiency Katrina Carrillo's Vitamin D level was 24.5 on 11/01/2019. She is currently taking prescription vitamin D 50,000 IU each week. She denies nausea, vomiting or muscle weakness.  She thinks it gives her energy, and maybe a better mood.  Assessment/Plan:   1. Essential hypertension Katrina Carrillo is working on healthy weight loss and exercise to improve blood pressure control. We will watch for signs of hypotension as she continues her lifestyle  modifications.  Find home blood pressure monitor and check every other day at least.  Obtain all medications and take as prescribed.  2. Vitamin D deficiency Low Vitamin D level contributes to fatigue and are associated with obesity, breast, and colon cancer. She agrees to continue to take prescription Vitamin D @50 ,000 IU every week and will follow-up for routine testing of Vitamin D, at least 2-3 times per year to avoid over-replacement.  Recheck levels after 2-3 months.  3. Class 2 severe obesity with serious comorbidity and body mass index (BMI) of 37.0 to 37.9 in adult, unspecified obesity type [facility]) Katrina Carrillo is currently in the action stage of change. As such, her goal is to continue with weight loss efforts. She has agreed to the Category 2 Plan.   Exercise goals: As is.  Behavioral modification strategies: increasing lean protein intake, decreasing simple carbohydrates, no skipping meals, meal planning and cooking strategies, better snacking choices and planning for success.  Katrina Carrillo has agreed to follow-up with our clinic in 2 weeks. She was informed of the importance of frequent follow-up visits to maximize her success with intensive lifestyle modifications for her multiple health conditions.   Objective:   Blood pressure (!) 136/93, pulse 93, temperature 98.2 F (36.8 C), height 5\' 3"  (1.6 m), weight 213 lb (96.6 kg), SpO2 99 %. Body mass index is 37.73 kg/m.  General: Cooperative, alert, well developed, in no acute distress. HEENT: Conjunctivae and lids unremarkable.  Cardiovascular: Regular rhythm.  Lungs: Normal work of breathing. Neurologic: No focal deficits.   Lab Results  Component Value Date   CREATININE 0.75 10/13/2019   BUN 8 10/13/2019   NA 141 10/13/2019   K 4.2 10/13/2019   CL 101 10/13/2019   CO2 25 10/13/2019   Lab Results  Component Value Date   ALT 18 10/13/2019   AST 17 10/13/2019   ALKPHOS 82 10/13/2019   BILITOT <0.2 10/13/2019   Lab Results    Component Value Date   HGBA1C 6.1 (H) 11/01/2019   HGBA1C 6.1 (H) 12/19/2017   Lab Results  Component Value Date   INSULIN 21.7 11/01/2019   Lab Results  Component Value Date   TSH 0.575 11/01/2019   Lab Results  Component Value Date   CHOL 207 (H) 10/13/2019   HDL 60 10/13/2019   LDLCALC 126 (H) 10/13/2019   LDLDIRECT 147.2 03/04/2013   TRIG 117 10/13/2019   CHOLHDL 3.5 10/13/2019   Lab Results  Component Value Date   WBC 9.1 10/13/2019   HGB 13.6 10/13/2019   HCT 43.9 10/13/2019   MCV 82 10/13/2019   PLT 299 10/13/2019   Attestation Statements:   Reviewed by clinician on day of visit: allergies, medications, problem list, medical history, surgical history, family history, social history, and previous encounter notes.  I, Water quality scientist, CMA, am acting as Location manager for [employer], DO.  I have reviewed the above documentation for accuracy and completeness, and I agree with the above. Mellody Dance, DO

## 2019-12-10 ENCOUNTER — Ambulatory Visit: Payer: BC Managed Care – PPO | Admitting: Internal Medicine

## 2019-12-22 ENCOUNTER — Encounter (INDEPENDENT_AMBULATORY_CARE_PROVIDER_SITE_OTHER): Payer: Self-pay | Admitting: Family Medicine

## 2019-12-22 ENCOUNTER — Ambulatory Visit (INDEPENDENT_AMBULATORY_CARE_PROVIDER_SITE_OTHER): Payer: BC Managed Care – PPO | Admitting: Family Medicine

## 2019-12-22 ENCOUNTER — Other Ambulatory Visit: Payer: Self-pay

## 2019-12-22 VITALS — BP 116/77 | HR 70 | Temp 97.9°F | Ht 63.0 in | Wt 207.0 lb

## 2019-12-22 DIAGNOSIS — Z9189 Other specified personal risk factors, not elsewhere classified: Secondary | ICD-10-CM | POA: Diagnosis not present

## 2019-12-22 DIAGNOSIS — I10 Essential (primary) hypertension: Secondary | ICD-10-CM

## 2019-12-22 DIAGNOSIS — E559 Vitamin D deficiency, unspecified: Secondary | ICD-10-CM | POA: Diagnosis not present

## 2019-12-22 DIAGNOSIS — Z6836 Body mass index (BMI) 36.0-36.9, adult: Secondary | ICD-10-CM | POA: Diagnosis not present

## 2019-12-22 MED ORDER — VITAMIN D (ERGOCALCIFEROL) 1.25 MG (50000 UNIT) PO CAPS: 50000.0000 [IU] | ORAL_CAPSULE | ORAL | 0 refills | Status: DC

## 2019-12-26 NOTE — Progress Notes (Signed)
Chief Complaint:   OBESITY Katrina Carrillo is here to discuss her progress with her obesity treatment plan along with follow-up of her obesity related diagnoses. Katrina Carrillo is on the Category 2 Plan and states she is following her eating plan approximately 80-90% of the time. Katrina Carrillo states she is walking for 45-60 minutes 3 times per week.  Today's visit was #: 5 Starting weight: 217 lbs Starting date: 10/28/2019 Today's weight: 207 lbs Today's date: 12/22/2019  Total lbs lost to date: 10 lbs Total lbs lost since last in-office visit: 6 lbs  Interim History:   Katrina Carrillo has done well over the past 2 weeks.    She says she has had no issues with the meal plan.    She has been weighing proteins on some days - 4 ounces at lunch and 6 ounces at dinner.    Hunger and cravings are controlled.    For snack calories, she has been having extra cheese on her salad or having salad dressings (not measuring).   Subjective:   1. Essential hypertension Review: taking medications as instructed, no medication side effects noted, no chest pain on exertion, no dyspnea on exertion, no swelling of ankles.  Blood pressure is improved.  She has not checked it at home.  Asymptomatic today.  She is taking all medications now as prescribed.  She is on Coreg, Lasix, hydralazine, and Imdur.  BP Readings from Last 3 Encounters:  12/22/19 116/77  12/08/19 (!) 136/93  11/25/19 (!) 100/40   2. Vitamin D deficiency Tomicka's Vitamin D level was 24.5 on 11/01/2019. She is currently taking prescription vitamin D 50,000 IU each week. She denies nausea, vomiting or muscle weakness.  Fatigue and energy levels are improving.    3. At risk for deficient intake of food The patient is at a higher than average risk of deficient intake of food.  Assessment/Plan:   1. Essential hypertension Katrina Carrillo is working on healthy weight loss and exercise to improve blood pressure control. We will watch for signs of hypotension as she  continues her lifestyle modifications.  Blood pressure is at goal. Continue medications and close monitoring as she loses weight.  2. Vitamin D deficiency Low Vitamin D level contributes to fatigue and are associated with obesity, breast, and colon cancer. She agrees to continue to take prescription Vitamin D @50 ,000 IU every week and will follow-up for routine testing of Vitamin D, at least 2-3 times per year to avoid over-replacement.  Regular monitoring to ensure level is at goal.  -Refill Vitamin D, Ergocalciferol, (DRISDOL) 1.25 MG (50000 UNIT) CAPS capsule; Take 1 capsule (50,000 Units total) by mouth every 7 (seven) days.  Dispense: 4 capsule; Refill: 0  3. At risk for deficient intake of food Katrina Carrillo was given approximately 15 minutes of deficit intake of food prevention counseling today due to historically missing meals on a regular basis.  Katrina Carrillo is at risk for eating too few calories based on current food recall.  She was encouraged to focus on meeting caloric and protein goals according to her recommended meal plan.   4. Class 2 severe obesity with serious comorbidity and body mass index (BMI) of 36.0 to 36.9 in adult, unspecified obesity type [facility]) Samona is currently in the action stage of change. As such, her goal is to continue with weight loss efforts. She has agreed to the Category 2 Plan.   Exercise goals: Increase exercise frequency to 4 days or more per week.  Behavioral modification strategies: no  skipping meals and planning for success.  Katrina Carrillo has agreed to follow-up with our clinic in 2 weeks. She was informed of the importance of frequent follow-up visits to maximize her success with intensive lifestyle modifications for her multiple health conditions.      Objective:   Blood pressure 116/77, pulse 70, temperature 97.9 F (36.6 C), height 5\' 3"  (1.6 m), weight 207 lb (93.9 kg), SpO2 97 %. Body mass index is 36.67 kg/m.  General: Cooperative, alert, well developed,  in no acute distress. HEENT: Conjunctivae and lids unremarkable. Cardiovascular: Regular rhythm.  Lungs: Normal work of breathing. Neurologic: No focal deficits.   Lab Results  Component Value Date   CREATININE 0.75 10/13/2019   BUN 8 10/13/2019   NA 141 10/13/2019   K 4.2 10/13/2019   CL 101 10/13/2019   CO2 25 10/13/2019   Lab Results  Component Value Date   ALT 18 10/13/2019   AST 17 10/13/2019   ALKPHOS 82 10/13/2019   BILITOT <0.2 10/13/2019   Lab Results  Component Value Date   HGBA1C 6.1 (H) 11/01/2019   HGBA1C 6.1 (H) 12/19/2017   Lab Results  Component Value Date   INSULIN 21.7 11/01/2019   Lab Results  Component Value Date   TSH 0.575 11/01/2019   Lab Results  Component Value Date   CHOL 207 (H) 10/13/2019   HDL 60 10/13/2019   LDLCALC 126 (H) 10/13/2019   LDLDIRECT 147.2 03/04/2013   TRIG 117 10/13/2019   CHOLHDL 3.5 10/13/2019   Lab Results  Component Value Date   WBC 9.1 10/13/2019   HGB 13.6 10/13/2019   HCT 43.9 10/13/2019   MCV 82 10/13/2019   PLT 299 10/13/2019   Attestation Statements:   Reviewed by clinician on day of visit: allergies, medications, problem list, medical history, surgical history, family history, social history, and previous encounter notes.  I, Water quality scientist, CMA, am acting as Location manager for [employer], DO.  I have reviewed the above documentation for accuracy and completeness, and I agree with the above. -  Mellody Dance, DO

## 2019-12-30 ENCOUNTER — Ambulatory Visit (INDEPENDENT_AMBULATORY_CARE_PROVIDER_SITE_OTHER): Payer: BC Managed Care – PPO | Admitting: Sports Medicine

## 2019-12-30 ENCOUNTER — Encounter: Payer: Self-pay | Admitting: Sports Medicine

## 2019-12-30 ENCOUNTER — Other Ambulatory Visit: Payer: Self-pay

## 2019-12-30 DIAGNOSIS — M79671 Pain in right foot: Secondary | ICD-10-CM

## 2019-12-30 DIAGNOSIS — M7730 Calcaneal spur, unspecified foot: Secondary | ICD-10-CM | POA: Diagnosis not present

## 2019-12-30 DIAGNOSIS — M7661 Achilles tendinitis, right leg: Secondary | ICD-10-CM

## 2019-12-30 DIAGNOSIS — M7662 Achilles tendinitis, left leg: Secondary | ICD-10-CM

## 2019-12-30 DIAGNOSIS — M79672 Pain in left foot: Secondary | ICD-10-CM

## 2019-12-30 MED ORDER — TRIAMCINOLONE ACETONIDE 10 MG/ML IJ SUSP
10.0000 mg | Freq: Once | INTRAMUSCULAR | Status: AC
  Administered 2019-12-30: 10 mg

## 2019-12-30 NOTE — Progress Notes (Signed)
Subjective: Katrina Carrillo is a 54 y.o. female patient who returns to office for follow up evaluation of Right>Left heel pain. Patient states that her right heel has flared up but recently started wearing night splint again which helps. Denies constitutional symptoms. Patient denies any other pedal complaints.     Patient Active Problem List   Diagnosis Date Noted  . Menopause present 01/20/2018  . SI (sacroiliac) joint dysfunction 05/06/2017  . Lumbar radiculopathy 05/06/2017  . Obesity (BMI 30-39.9) 12/07/2015  . OSA (obstructive sleep apnea) 06/12/2015  . Persistent asthma 01/23/2015  . Allergic rhinoconjunctivitis, seasonal and perennial 01/29/2014  . Nonischemic cardiomyopathy ([place]) 08/12/2012  . 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 CHRONIC SYSTOLIC HEART FAILURE 00/93/8182  . Essential hypertension 02/22/2009  . LIVER HEMANGIOMA 02/15/2009    Current Outpatient Medications on File Prior to Visit  Medication Sig Dispense Refill  . albuterol (PROAIR HFA) 108 (90 Base) MCG/ACT inhaler ProAir HFA 90 mcg/actuation aerosol inhaler  inhale 2 puffs by mouth every 4 hours if needed for cough or whee...  (REFER TO PRESCRIPTION NOTES).    Marland Kitchen amoxicillin (AMOXIL) 500 MG capsule Take 500 mg by mouth as needed (for dental visits).     Marland Kitchen amoxicillin-clavulanate (AUGMENTIN) 875-125 MG tablet Take 1 tablet by mouth 2 (two) times daily.    . carvedilol (COREG) 6.25 MG tablet TAKE 2 TABLETS BY MOUTH EVERY 12 HOURS 120 tablet 7  . Diclofenac Sodium (PENNSAID) 2 % SOLN Pennsaid 20 mg/gram/actuation (2 %) topical soln in metered-dose pump  apply 2 pumps twice daily    . EPINEPHrine (EPIPEN 2-PAK) 0.3 mg/0.3 mL IJ SOAJ injection EpiPen 2-Pak 0.3 mg/0.3 mL injection, auto-injector    . furosemide (LASIX) 40 MG tablet Take 1 tablet (40 mg total) by mouth 2 (two) times daily. 180 tablet 3  . hydrALAZINE (APRESOLINE) 25 MG tablet TAKE 1 TABLET BY MOUTH TWICE DAILY(PLEASE MAKE APPOINTMENT WITH DISCARD REMAINDER.  ROSS BEFORE ANYMORE REFILLS) 180 tablet 3  . HYDROcodone-acetaminophen (NORCO/VICODIN) 5-325 MG tablet Take 1 tablet by mouth every 4 (four) hours as needed.    Marland Kitchen ipratropium (ATROVENT) 0.03 % nasal spray ipratropium bromide 21 mcg (0.03 %) nasal spray    . isosorbide mononitrate (IMDUR) 30 MG 24 hr tablet Take 1 tablet (30 mg total) by mouth daily. 90 tablet 3  . loratadine (CLARITIN) 10 MG tablet Take 10 mg by mouth daily.    Marland Kitchen NASONEX 50 MCG/ACT nasal spray Place 2 sprays into both nostrils as needed.   0  . penicillin v potassium (VEETID) 500 MG tablet Take 500 mg by mouth 4 (four) times daily.    . potassium chloride SA (KLOR-CON) 20 MEQ tablet TAKE 2 TABLETS BY MOUTH 2 TIMES A DAY 120 tablet 6  . TURMERIC PO Take by mouth 2 (two) times daily.    Marland Kitchen UNABLE TO FIND Inhale 2 sprays into the lungs as needed. qvar inhaler    . VITAMIN D PO Take 1 tablet by mouth daily.    . Vitamin D, Ergocalciferol, (DRISDOL) 1.25 MG (50000 UNIT) CAPS capsule Take 1 capsule (50,000 Units total) by mouth every 7 (seven) days. 4 capsule 0   No current facility-administered medications on file prior to visit.    Allergies  Allergen Reactions  . Peanut-Containing Drug Products Anaphylaxis  . Shellfish Allergy Anaphylaxis  . Vasotec [Enalapril] Swelling    Mouth swelling  . Iodine Swelling and Rash    Objective:  General: Alert and oriented x3 in no acute distress  Dermatology: No open lesions bilateral lower extremities, no webspace macerations, no ecchymosis bilateral, all nails x 10 are well manicured.  Vascular: Dorsalis Pedis and Posterior Tibial pedal pulses 1/4, Capillary Fill Time 3 seconds, + pedal hair growth bilateral, no edema bilateral lower extremities, Temperature gradient within normal limits.  Neurology: Johney Maine sensation intact via light touch bilateral.   Musculoskeletal:+ tenderness with palpation at insertion of the Achilles on Right> Left, there is calcaneal exostosis with mild soft  tissue present and decreased ankle rom with knee extending  vs flexed resembling gastroc equnius bilateral, The achilles tendon feels intact with no nodularity or palpable dell, Thompson sign negative, no reproducible pain to plantar fascia bilateral, + pes planus noted bilateral.   Assessment and Plan: Problem List Items Addressed This Visit    None    Visit Diagnoses    Achilles tendonitis, bilateral    -  Primary   Heel spur, unspecified laterality       Foot pain, bilateral          -Complete examination performed -Discussed treatement options for tendonitis bilateral with heel spur -After oral consent and aseptic prep, injected a mixture containing 1 ml of 2%  plain lidocaine, 1 ml 0.5% plain marcaine, 0.5 ml of kenalog 10 and 0.5 ml of dexamethasone phosphate into right achilles insertion without complication. Post-injection care discussed with patient.  -Dispensed heel lifts and advised patient to refrain from strenous activity to avoid injury post injection -Recommend continue with icing, stretching, and Night splint -No improvement will consider MRI/PT/EPAT -Patient to return to office as needed or sooner if condition worsens.  Landis Martins, DPM

## 2020-01-10 ENCOUNTER — Encounter (INDEPENDENT_AMBULATORY_CARE_PROVIDER_SITE_OTHER): Payer: Self-pay | Admitting: Family Medicine

## 2020-01-10 ENCOUNTER — Ambulatory Visit (INDEPENDENT_AMBULATORY_CARE_PROVIDER_SITE_OTHER): Payer: BC Managed Care – PPO | Admitting: Family Medicine

## 2020-01-10 ENCOUNTER — Other Ambulatory Visit: Payer: Self-pay

## 2020-01-10 VITALS — BP 121/81 | HR 87 | Temp 97.8°F | Ht 63.0 in | Wt 210.0 lb

## 2020-01-10 DIAGNOSIS — R7303 Prediabetes: Secondary | ICD-10-CM

## 2020-01-10 DIAGNOSIS — Z9189 Other specified personal risk factors, not elsewhere classified: Secondary | ICD-10-CM

## 2020-01-10 DIAGNOSIS — E559 Vitamin D deficiency, unspecified: Secondary | ICD-10-CM

## 2020-01-10 DIAGNOSIS — I10 Essential (primary) hypertension: Secondary | ICD-10-CM | POA: Diagnosis not present

## 2020-01-10 DIAGNOSIS — Z6837 Body mass index (BMI) 37.0-37.9, adult: Secondary | ICD-10-CM

## 2020-01-10 MED ORDER — VITAMIN D (ERGOCALCIFEROL) 1.25 MG (50000 UNIT) PO CAPS: 50000.0000 [IU] | ORAL_CAPSULE | ORAL | 0 refills | Status: AC

## 2020-01-11 NOTE — Progress Notes (Signed)
Chief Complaint:   OBESITY Katrina Carrillo is here to discuss her progress with her obesity treatment plan along with follow-up of her obesity related diagnoses. Katrina Carrillo is on the Category 2 Plan and states she is following her eating plan approximately 80% of the time. Katrina Carrillo states she is walking for 30-45 minutes 5 times per week.  Today's visit was #: 6 Starting weight: 217 lbs Starting date: 10/28/2019 Today's weight: 210 lbs Today's date: 01/10/2020 Total lbs lost to date: 7 lbs Total lbs lost since last in-office visit: +3 lbs  Interim History: Katrina Carrillo says she will be going to [place] for her birthday this coming weekend.  Over the weekend, she had parties and went to a vineyard and also ate out a [employer] - it was catered.  On Friday, she ate at [employer] [place].  She tried to be mindful, but the food was catered, so she was unable to choose her own foods.  She also skips breakfast or lunch and/or is not eating all of her foods.  Assessment/Plan:   1. Vitamin D deficiency Katrina Carrillo has a history of Vitamin D deficiency with resultant generalized fatigue as her primary symptom.  she is taking vitamin D 50,000 IU weekly. for this deficiency and tolerating it well without side-effect.   Most recent Vitamin D lab reviewed-  level: 24.5  Plan:   - Discussed importance of vitamin D (as well as calcium) to their health and well-being.   - We reviewed possible symptoms of low Vitamin D including low energy, depressed mood, muscle aches, joint aches, osteoporosis etc.  - We discussed that low Vitamin D levels may be linked to an increased risk of cardiovascular events and even increased risk of cancers- such as colon and breast.   - Educated pt that weight loss will likely improve availability of vitamin D, thus encouraged Aniyia to continue with meal plan and their weight loss efforts to further improve this condition  - I recommend pt take a weekly  prescription vit D- see script below- which pt agrees to after discussion of risks and benefits of this medication.      - Informed patient this may be a lifelong thing, and she was encouraged to continue to take the medicine until pt told otherwise.   We will need to monitor levels regularly ( q 3-4 mo on average )  to keep levels within normal limits.   - All pt's questions and concerns regarding this condition addressed  - Vitamin D, Ergocalciferol, (DRISDOL) 1.25 MG (50000 UNIT) CAPS capsule; Take 1 capsule (50,000 Units total) by mouth every 7 (seven) days.  Dispense: 4 capsule; Refill: 0  2. Essential hypertension At goal. Medications: Coreg, Imdur. We will monitor for hypotension with continued weight loss.  Continue prudent nutritional plan, low salt, weight loss.  BP Readings from Last 3 Encounters:  01/10/20 121/81  12/22/19 116/77  12/08/19 (!) 136/93   Lab Results  Component Value Date   CREATININE 0.75 10/13/2019   3. Prediabetes Katrina Carrillo will continue to work on weight loss, exercise, and decreasing simple carbohydrates to help decrease the risk of diabetes.  Continue prudent nutritional plan, weight loss.  Lab Results  Component Value Date   HGBA1C 6.1 (H) 11/01/2019   Lab Results  Component Value Date   INSULIN 21.7 11/01/2019   4. At risk for complication associated with hypotension Katrina Carrillo was given approximately 15 minutes of education and counseling today to help  avoid hypotension. We discussed risks of hypotension with weight loss and signs of hypotension such as feeling lightheaded or unsteady.  Repetitive spaced learning was employed today to elicit superior memory formation and behavioral change.  5. Class 2 severe obesity with serious comorbidity and body mass index (BMI) of 37.0 to 37.9 in adult, unspecified obesity type Katrina Carrillo)  Katrina Carrillo is currently in the action stage of change. As such, her goal is to continue with weight loss efforts. She has agreed to  the Category 2 Plan.   Exercise goals: For substantial health benefits, adults should do at least 150 minutes (2 hours and 30 minutes) a week of moderate-intensity, or 75 minutes (1 hour and 15 minutes) a week of vigorous-intensity aerobic physical activity, or an equivalent combination of moderate- and vigorous-intensity aerobic activity. Aerobic activity should be performed in episodes of at least 10 minutes, and preferably, it should be spread throughout the week.  Behavioral modification strategies: increasing lean protein intake, increasing water intake, decreasing sodium intake, no skipping meals, travel eating strategies and celebration eating strategies.  Katrina Carrillo has agreed to follow-up with our clinic in 2 weeks. She was informed of the importance of frequent follow-up visits to maximize her success with intensive lifestyle modifications for her multiple health conditions.   Objective:   Blood pressure 121/81, pulse 87, temperature 97.8 F (36.6 C), height 5\' 3"  (1.6 m), weight 210 lb (95.3 kg), SpO2 98 %. Body mass index is 37.2 kg/m.  General: Cooperative, alert, well developed, in no acute distress. HEENT: Conjunctivae and lids unremarkable. Cardiovascular: Regular rhythm.  Lungs: Normal work of breathing. Neurologic: No focal deficits.   Lab Results  Component Value Date   CREATININE 0.75 10/13/2019   BUN 8 10/13/2019   NA 141 10/13/2019   K 4.2 10/13/2019   CL 101 10/13/2019   CO2 25 10/13/2019   Lab Results  Component Value Date   ALT 18 10/13/2019   AST 17 10/13/2019   ALKPHOS 82 10/13/2019   BILITOT <0.2 10/13/2019   Lab Results  Component Value Date   HGBA1C 6.1 (H) 11/01/2019   HGBA1C 6.1 (H) 12/19/2017   Lab Results  Component Value Date   INSULIN 21.7 11/01/2019   Lab Results  Component Value Date   TSH 0.575 11/01/2019   Lab Results  Component Value Date   CHOL 207 (H) 10/13/2019   HDL 60 10/13/2019   LDLCALC 126 (H) 10/13/2019   LDLDIRECT  147.2 03/04/2013   TRIG 117 10/13/2019   CHOLHDL 3.5 10/13/2019   Lab Results  Component Value Date   WBC 9.1 10/13/2019   HGB 13.6 10/13/2019   HCT 43.9 10/13/2019   MCV 82 10/13/2019   PLT 299 10/13/2019   Attestation Statements:   Reviewed by clinician on day of visit: allergies, medications, problem list, medical history, surgical history, family history, social history, and previous encounter notes.  I, Water quality scientist, CMA, am acting as Location manager for [employer], DO.  I have reviewed the above documentation for accuracy and completeness, and I agree with the above. Marjory Sneddon, D.O.  The [place] was signed into law in 2016 which includes the topic of electronic health records.  This provides immediate access to information in MyChart.  This includes consultation notes, operative notes, office notes, lab results and pathology reports.  If you have any questions about what you read please let us know at your next visit so we can discuss your concerns and take  corrective action if need be.  We are right here with you.

## 2020-01-24 ENCOUNTER — Ambulatory Visit (INDEPENDENT_AMBULATORY_CARE_PROVIDER_SITE_OTHER): Payer: BC Managed Care – PPO | Admitting: Family Medicine

## 2020-01-31 ENCOUNTER — Ambulatory Visit: Payer: BC Managed Care – PPO | Attending: Family

## 2020-01-31 DIAGNOSIS — Z23 Encounter for immunization: Secondary | ICD-10-CM

## 2020-02-09 ENCOUNTER — Other Ambulatory Visit: Payer: Self-pay | Admitting: Internal Medicine

## 2020-02-09 DIAGNOSIS — Z0000 Encounter for general adult medical examination without abnormal findings: Secondary | ICD-10-CM

## 2020-03-23 ENCOUNTER — Ambulatory Visit
Admission: RE | Admit: 2020-03-23 | Discharge: 2020-03-23 | Disposition: A | Discharge status: Home / Self Care | Payer: BC Managed Care – PPO | Source: Ambulatory Visit | Attending: Internal Medicine | Admitting: Internal Medicine

## 2020-03-23 ENCOUNTER — Other Ambulatory Visit: Payer: Self-pay

## 2020-03-23 DIAGNOSIS — Z0000 Encounter for general adult medical examination without abnormal findings: Secondary | ICD-10-CM

## 2020-03-29 NOTE — Progress Notes (Signed)
   Covid-19 Vaccination Clinic  Name:  Katrina Carrillo    MRN: [id number] DOB: 06-May-1965  03/29/2020  [name] was observed post Covid-19 immunization for 15 minutes without incident. She was provided with Vaccine Information Sheet and instruction to access the V-Safe system.   [name] was instructed to call 911 with any severe reactions post vaccine: Marland Kitchen Difficulty breathing  . Swelling of face and throat  . A fast heartbeat  . A bad rash all over body  . Dizziness and weakness   Immunizations Administered    Name Date Dose VIS Date Route   Moderna Covid-19 Booster Vaccine 01/31/2020 11:45 AM 0.25 mL 01/19/2020 Intramuscular   Manufacturer: Moderna   Lot: [id number]   NDC: 76811-572-62

## 2020-10-26 ENCOUNTER — Other Ambulatory Visit: Payer: Self-pay | Admitting: Internal Medicine

## 2020-10-27 LAB — CBC
HCT: 42.7 % (ref 35.0–45.0)
Hemoglobin: 13.4 g/dL (ref 11.7–15.5)
MCH: 26.3 pg — ABNORMAL LOW (ref 27.0–33.0)
MCHC: 31.4 g/dL — ABNORMAL LOW (ref 32.0–36.0)
MCV: 83.7 fL (ref 80.0–100.0)
MPV: 10.4 fL (ref 7.5–12.5)
Platelets: 269 10*3/uL (ref 140–400)
RBC: 5.1 10*6/uL (ref 3.80–5.10)
RDW: 13.8 % (ref 11.0–15.0)
WBC: 9.4 10*3/uL (ref 3.8–10.8)

## 2020-10-27 LAB — LIPID PANEL
Cholesterol: 180 mg/dL (ref <200)
HDL: 50 mg/dL (ref >=50)
LDL Cholesterol (Calc): 111 mg/dL (calc) — ABNORMAL HIGH
Non-HDL Cholesterol (Calc): 130 mg/dL (calc) — ABNORMAL HIGH (ref <130)
Total CHOL/HDL Ratio: 3.6 (calc) (ref <5.0)
Triglycerides: 86 mg/dL (ref <150)

## 2020-10-27 LAB — COMPLETE METABOLIC PANEL WITH GFR
AG Ratio: 1.1 (calc) (ref 1.0–2.5)
ALT: 22 U/L (ref 6–29)
AST: 17 U/L (ref 10–35)
Albumin: 3.8 g/dL (ref 3.6–5.1)
Alkaline phosphatase (APISO): 58 U/L (ref 37–153)
BUN: 14 mg/dL (ref 7–25)
CO2: 25 mmol/L (ref 20–32)
Calcium: 9.3 mg/dL (ref 8.6–10.4)
Chloride: 105 mmol/L (ref 98–110)
Creat: 0.85 mg/dL (ref 0.50–1.03)
Globulin: 3.5 g/dL (calc) (ref 1.9–3.7)
Glucose, Bld: 98 mg/dL (ref 65–99)
Potassium: 4 mmol/L (ref 3.5–5.3)
Sodium: 140 mmol/L (ref 135–146)
Total Bilirubin: 0.2 mg/dL (ref 0.2–1.2)
Total Protein: 7.3 g/dL (ref 6.1–8.1)
eGFR: 81 mL/min/{1.73_m2} (ref >=60)

## 2020-10-27 LAB — VITAMIN D 25 HYDROXY (VIT D DEFICIENCY, FRACTURES): Vit D, 25-Hydroxy: 42 ng/mL (ref 30–100)

## 2020-10-27 LAB — TSH: TSH: 0.34 mIU/L — ABNORMAL LOW

## 2020-10-27 LAB — TIQ-NTM

## 2021-01-08 NOTE — Progress Notes (Signed)
Cardiology Office Note:    Date:  01/08/2021   ID:  [name], [name] [birth year], MRN [id number]  PCP:  Nolene Ebbs, MD   [facility] Providers Cardiologist:  Dorris Carnes, MD     Referring MD: Nolene Ebbs, MD   Follow-up for chronic systolic CHF and HTN.  History of Present Illness:    Katrina Carrillo is a 55 y.o. female with a hx of HTN, chronic systolic CHF, nonischemic cardiomyopathy EF 15% with normal coronaries in 2010, echocardiogram 10/19 showed EF 35-40%, persistent asthma, OSA, and obesity.  PMH also includes angioedema on ACE inhibitor  She was seen by Dr. Harrington Challenger on 9/20 and was stable at that time.  She did not want to titrate medications.  She was seen by Estella Husk PA-C on 10/13/2019.  During that time she had gained around 10 pounds.  She was not active.  She noted tingling in her right thumb and forefinger with laying down.  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  She denied chest pain, edema, and shortness of breath.  She presents the clinic today for follow-up evaluation states she feels well.  She has been somewhat sedentary over the last several months.  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  She reports that her mom came to live at her house and was placed on hospice/palliative care.  She passed away 12/18/2022.  She reports that she has gained some weight over the last year.  She plans to start walking again.  We reviewed her previous echocardiograms and chronic combined CHF.  She expressed understanding.  We reviewed her current heart failure medications and their needs.  She reports that she had angioedema related to lisinopril.  I will give her the salty 6 diet sheet, have her increase her physical activity as tolerated and plan follow-up in 6 to 9 months.  Today she denies chest pain, shortness of breath, lower extremity edema, fatigue, palpitations, melena, hematuria, hemoptysis, diaphoresis, weakness,  presyncope, syncope, orthopnea, and PND.   Past Medical History:  Diagnosis Date   Allergy    Asthma    right knee    CHF (congestive heart failure) (HCC)    cardiomyopathy   Diverticulosis    Family history of malignant neoplasm of gastrointestinal tract 02/17/2012   Father with colon cancer age 63    [place] DISEASE 02/22/2009   Qualifier: Diagnosis of  By: Ronnald Ramp, CNA, Deborah     GERD (gastroesophageal reflux disease)    pt states doesnt have recently    Hypertension    Nonischemic cardiomyopathy ([place])    [place] 01/2009: Normal coronary arteries, EF 15%. Last echo 09/2010: EF 65-78%, grade 2 diastolic dysfunction, trivial MR, mild LAE.8/14 EF 40-45%    Obesity (BMI 30-39.9) 12/07/2015   OSA (obstructive sleep apnea) 06/12/2015   Mild OSA with total AHI 8/hr and in REM sleep 26/hr with oxygen desaturations as low as 70% with respiratory events.     OSA (obstructive sleep apnea)    use CPAP nightly   Seasonal allergies    Sleep apnea    wears cpap    Past Surgical History:  Procedure Laterality Date   CESAREAN SECTION  07/11/1991   COLONOSCOPY     POLYSOMNOGRAPHY 4 OR MORE PARAMETERS  08/03/2015   TUBAL LIGATION     S/P    Current Medications: No outpatient medications have been marked as taking for the 01/09/21 encounter (Appointment) with  Deberah Pelton, NP.     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   Divorced.   Social Determinants of Health   Financial Resource Strain: Not on file  Food Insecurity: Not on file  Transportation Needs: Not on file  Physical Activity: Not on file  Stress: Not on file  Social Connections: Not on file     Family History: The patient's family history includes Alcoholism in her father; Cancer in her father, mother, and paternal grandmother; Colon cancer (age of onset: 85) in her father; Coronary artery disease in her father; Diabetes in her maternal grandmother and mother; Hypertension in her father, mother, and another family member; Mental retardation in her son; Obesity in her mother. There is no history of Stomach cancer, Colon polyps, or Rectal cancer.  ROS:   Please see the history of present illness.     All other systems reviewed and are negative.   Risk Assessment/Calculations:           Physical Exam:    VS:  There were no vitals taken for this visit.    Wt Readings from Last 3 Encounters:  01/10/20 210 lb (95.3 kg)  12/22/19 207 lb (93.9 kg)  12/08/19 213 lb (96.6 kg)     GEN:  Well nourished, well developed in no acute distress HEENT: Normal NECK: No JVD; No carotid bruits LYMPHATICS: No lymphadenopathy CARDIAC: RRR, no murmurs, rubs, gallops RESPIRATORY:  Clear to auscultation without rales, wheezing or rhonchi  ABDOMEN: Soft, non-tender, non-distended MUSCULOSKELETAL:  No edema; No deformity  SKIN: Warm and dry NEUROLOGIC:  Alert and oriented x 3 PSYCHIATRIC:  Normal affect    EKGs/Labs/Other Studies Reviewed:    The following studies were reviewed today: Echocardiogram 01/20/2018  Study Conclusions   - Left  ventricle: The cavity size was normal. Wall thickness was    normal. Systolic function was moderately reduced. The estimated    ejection fraction was in the range of 35% to 40%. Diffuse    hypokinesis. Features are consistent with a pseudonormal left    ventricular filling pattern, with concomitant abnormal relaxation    and increased filling pressure (grade 2 diastolic dysfunction).   EKG:  EKG is  ordered today.  The ekg ordered today demonstrates normal sinus rhythm with sinus arrhythmia 73 bpm no acute changes.  Recent Labs: 10/26/2020: ALT 22; BUN 14; Creat 0.85; Hemoglobin 13.4; Platelets 269; Potassium 4.0; Sodium 140; TSH 0.34  Recent Lipid Panel  Component Value Date/Time   CHOL 180 10/26/2020 0000   CHOL 207 (H) 10/13/2019 1209   TRIG 86 10/26/2020 0000   HDL 50 10/26/2020 0000   HDL 60 10/13/2019 1209   CHOLHDL 3.6 10/26/2020 0000   VLDL 12 03/13/2015 0915   LDLCALC 111 (H) 10/26/2020 0000   LDLDIRECT 147.2 03/04/2013 1004    ASSESSMENT & PLAN    Nonischemic cardiomyopathy-no increased DOE or activity intolerance.  Echocardiogram 10/19 showed EF 35-40% and G2 DD. Continue carvedilol, furosemide, hydralazine Heart healthy low-sodium diet-salty 6 given Increase physical activity as tolerated  Chronic systolic and diastolic CHF-euvolemic today.  Reports sedentary lifestyle.  Echocardiogram showed EF 35-40% and G2 DD 10/19. Continue carvedilol, furosemide, hydralazine Heart healthy low-sodium diet-salty 6 given Increase physical activity as tolerated Daily weights-contact office with a weight increase of 3 pounds overnight or 5 pounds in 1 week  Essential hypertension-BP today 128/80.  Well-controlled at home. Continue Imdur, carvedilol, hydralazine Heart healthy low-sodium diet-salty 6 given Increase physical activity as tolerated  Obesity-weight today 219.8. Continue weight loss Increase physical activity as tolerated  Disposition: Follow-up with Dr. Harrington Challenger or  APP in 6-9 months.       Medication Adjustments/Labs and Tests Ordered: Current medicines are reviewed at length with the patient today.  Concerns regarding medicines are outlined above.  No orders of the defined types were placed in this encounter.  No orders of the defined types were placed in this encounter.   There are no Patient Instructions on file for this visit.   Signed, Deberah Pelton, NP  01/08/2021 7:33 AM      Notice: This dictation was prepared with Dragon dictation along with smaller phrase technology. Any transcriptional errors that result from this process are unintentional and may not be corrected upon review.  I spent 14 minutes examining this patient, reviewing medications, and using patient centered shared decision making involving her cardiac care.  Prior to her visit I spent greater than 20 minutes reviewing her past medical history,  medications, and prior cardiac tests.

## 2021-01-09 ENCOUNTER — Other Ambulatory Visit: Payer: Self-pay

## 2021-01-09 ENCOUNTER — Ambulatory Visit (HOSPITAL_BASED_OUTPATIENT_CLINIC_OR_DEPARTMENT_OTHER): Payer: BC Managed Care – PPO | Admitting: General Practice

## 2021-01-09 ENCOUNTER — Encounter (HOSPITAL_BASED_OUTPATIENT_CLINIC_OR_DEPARTMENT_OTHER): Payer: Self-pay | Admitting: General Practice

## 2021-01-09 VITALS — BP 128/80 | HR 73 | Ht 63.0 in | Wt 219.8 lb

## 2021-01-09 DIAGNOSIS — E669 Obesity, unspecified: Secondary | ICD-10-CM

## 2021-01-09 DIAGNOSIS — I5022 Chronic systolic (congestive) heart failure: Secondary | ICD-10-CM | POA: Diagnosis not present

## 2021-01-09 DIAGNOSIS — I428 Other cardiomyopathies: Secondary | ICD-10-CM

## 2021-01-09 DIAGNOSIS — I10 Essential (primary) hypertension: Secondary | ICD-10-CM

## 2021-01-09 NOTE — Patient Instructions (Signed)
Medication Instructions:  Continue current medications  *If you need a refill on your cardiac medications before your next appointment, please call your pharmacy*   Lab Work: None Ordered   Testing/Procedures: None Ordered   Follow-Up: At [facility], you and your health needs are our priority.  As part of our continuing mission to provide you with exceptional heart care, we have created designated Provider Care Teams.  These Care Teams include your primary Cardiologist (physician) and Advanced Practice Providers (APPs -  Physician Assistants and Nurse Practitioners) who all work together to provide you with the care you need, when you need it.  We recommend signing up for the patient portal called "MyChart".  Sign up information is provided on this After Visit Summary.  MyChart is used to connect with patients for Virtual Visits (Telemedicine).  Patients are able to view lab/test results, encounter notes, upcoming appointments, etc.  Non-urgent messages can be sent to your provider as well.   To learn more about what you can do with MyChart, go to [email or web address].    Your next appointment:   6 month(s)  The format for your next appointment:   In Person  Provider:   You may see Dorris Carnes, MD or one of the following Advanced Practice Providers on your designated Care Team:   Richardson Dopp, PA-C [place], [place]   INCREASE physical activities

## 2021-04-23 ENCOUNTER — Ambulatory Visit (INDEPENDENT_AMBULATORY_CARE_PROVIDER_SITE_OTHER): Payer: BC Managed Care – PPO | Admitting: Orthopaedic Surgery

## 2021-04-23 ENCOUNTER — Other Ambulatory Visit: Payer: Self-pay

## 2021-04-23 ENCOUNTER — Ambulatory Visit: Payer: Self-pay

## 2021-04-23 ENCOUNTER — Encounter: Payer: Self-pay | Admitting: Orthopaedic Surgery

## 2021-04-23 VITALS — Ht 63.0 in | Wt 219.8 lb

## 2021-04-23 DIAGNOSIS — M25561 Pain in right knee: Secondary | ICD-10-CM

## 2021-04-23 DIAGNOSIS — M25562 Pain in left knee: Secondary | ICD-10-CM

## 2021-04-23 DIAGNOSIS — G8929 Other chronic pain: Secondary | ICD-10-CM | POA: Diagnosis not present

## 2021-04-23 DIAGNOSIS — M7662 Achilles tendinitis, left leg: Secondary | ICD-10-CM

## 2021-04-23 MED ORDER — PREDNISONE 50 MG PO TABS: ORAL_TABLET | ORAL | 0 refills | Status: DC

## 2021-04-23 NOTE — Progress Notes (Signed)
Office Visit Note   Patient: Katrina Carrillo           Date of Birth: 1966-01-29           MRN: [id number] Visit Date: 04/23/2021              Requested by: Nolene Ebbs, MD [address] [place],  [place] [address] PCP: Nolene Ebbs, MD   Assessment & Plan: Visit Diagnoses:  1. Chronic pain of left knee   2. Chronic pain of right knee   3. Achilles tendinitis, left leg     Plan: I have recommended Voltaren gel for her knees as well as her Achilles tendon and plantar fascial.  I would like to set her up for outpatient physical therapy to work on any modalities that can strengthen her knees and can help with the Achilles tendinitis is an Planter fasciitis.  I have given her a Thera-Band for stretching the Achilles on the left side.  I also recommended weight loss.  I did send in 5 days of 50 mg of prednisone to see if this would help with inflammation.  All questions and concerns were answered and addressed.  We will see her back in 6 weeks after course of outpatient therapy.  We did talk about exercise regimen and I will have her avoid the treadmill but try exercise bike and elliptical.  Follow-Up Instructions: Return in about 6 weeks (around 06/04/2021).   Orders:  Orders Placed This Encounter  Procedures   XR Knee 1-2 Views Right   XR Knee 1-2 Views Left   Meds ordered this encounter  Medications   predniSONE (DELTASONE) 50 MG tablet    Sig: Take one tablet daily for 5 days.    Dispense:  5 tablet    Refill:  0      Procedures: No procedures performed   Clinical Data: No additional findings.   Subjective: Chief Complaint  Patient presents with   Left Knee - Pain   Right Knee - Pain  The patient is a 56 year old female who works [employer].  She has been dealing with bilateral knee pain for some time now but also left Achilles pain and left plan fasciitis.  She also has low back pain.  She does see her chiropractor for her low back.  She is not a  diabetic.  She does report some heart disease.  Her BMI is 39.  Some of the pain in her knees has been chronic for some time now.  That probably throws off her gait and is affected her back and her left Achilles tendon area.  She is never had surgery on her knees but she has had steroid injections remotely in the past.  She does have an old MRI from 2017 of her right knee that I was able to review.  It showed some arthritic changes in the knee but no significant meniscal tear.  There was a sprain of the MCL.  HPI  Review of Systems There is currently listed no headache, chest pain, shortness of breath, fever, chills, nausea, vomiting  Objective: Vital Signs: Ht 5\' 3"  (1.6 m)    Wt 219 lb 12.8 oz (99.7 kg)    BMI 38.94 kg/m   Physical Exam She is alert and orient x3 and in no acute distress Ortho Exam Examination of both knees shows slight varus malalignment.  Both knees hyperextend and have some medial joint tenderness but no effusion.  Both knees have excellent range of  motion and are ligamentously stable with some global tenderness.  Her left Achilles is intact but has insertional Achilles pain with a negative Thompson test.  Her foot is not flat but does have pain over the plantar fascia.  The range of motion of her ankle is normal. Specialty Comments:  No specialty comments available.  Imaging: XR Knee 1-2 Views Left  Result Date: 04/23/2021 2 views of the left knee show tricompartmental arthritic changes with varus malalignment and most of the arthritis of the medial compartment and the patellofemoral joint.  XR Knee 1-2 Views Right  Result Date: 04/23/2021 2 views of the right knee show tricompartment arthritis with almost bone-on-bone wear of the medial compartment.  There is varus malalignment as well.    PMFS History: Patient Active Problem List   Diagnosis Date Noted   Menopause present 01/20/2018   SI (sacroiliac) joint dysfunction 05/06/2017   Lumbar radiculopathy  05/06/2017   Obesity (BMI 30-39.9) 12/07/2015   OSA (obstructive sleep apnea) 06/12/2015   Persistent asthma 01/23/2015   Allergic rhinoconjunctivitis, seasonal and perennial 01/29/2014   Nonischemic cardiomyopathy ([place]) 08/12/2012   THYROMEGALY 81/19/1478   CHRONIC SYSTOLIC HEART FAILURE 29/56/2130   Essential hypertension 02/22/2009   LIVER HEMANGIOMA 02/15/2009   Past Medical History:  Diagnosis Date   Allergy    Asthma    right knee    CHF (congestive heart failure) ([place])    cardiomyopathy   Diverticulosis    Family history of malignant neoplasm of gastrointestinal tract 02/17/2012   Father with colon cancer age 72    [place] DISEASE 02/22/2009   Qualifier: Diagnosis of  By: Ronnald Ramp, CNA, Deborah     GERD (gastroesophageal reflux disease)    pt states doesnt have recently    Hypertension    Nonischemic cardiomyopathy ([place])    [place] 01/2009: Normal coronary arteries, EF 15%. Last echo 09/2010: EF 86-57%, grade 2 diastolic dysfunction, trivial MR, mild LAE.8/14 EF 40-45%    Obesity (BMI 30-39.9) 12/07/2015   OSA (obstructive sleep apnea) 06/12/2015   Mild OSA with total AHI 8/hr and in REM sleep 26/hr with oxygen desaturations as low as 70% with respiratory events.     OSA (obstructive sleep apnea)    use CPAP nightly   Seasonal allergies    Sleep apnea    wears cpap    Family History  Problem Relation Age of Onset   Coronary artery disease Father    Colon cancer Father 68   Cancer Father    Hypertension Father    Alcoholism Father    Diabetes Mother    Cancer Mother        vulvular cancer   Hypertension Mother    Obesity Mother    Diabetes Maternal Grandmother    Cancer Paternal Grandmother    Mental retardation Son        TBI during birth   Hypertension Other    Stomach cancer Neg Hx    Colon polyps Neg Hx    Rectal cancer Neg Hx     Past Surgical History:  Procedure Laterality Date   CESAREAN SECTION  07/11/1991   COLONOSCOPY      POLYSOMNOGRAPHY 4 OR MORE PARAMETERS  08/03/2015   TUBAL LIGATION     S/P   Social History   Occupational History   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

## 2021-04-27 ENCOUNTER — Other Ambulatory Visit: Payer: Self-pay | Admitting: Internal Medicine

## 2021-04-27 DIAGNOSIS — Z1231 Encounter for screening mammogram for malignant neoplasm of breast: Secondary | ICD-10-CM

## 2021-04-30 ENCOUNTER — Ambulatory Visit
Admission: RE | Admit: 2021-04-30 | Discharge: 2021-04-30 | Disposition: A | Discharge status: Home / Self Care | Payer: BC Managed Care – PPO | Source: Ambulatory Visit

## 2021-04-30 DIAGNOSIS — Z1231 Encounter for screening mammogram for malignant neoplasm of breast: Secondary | ICD-10-CM

## 2021-05-02 NOTE — Therapy (Signed)
OUTPATIENT PHYSICAL THERAPY LOWER EXTREMITY EVALUATION   Patient Name: Katrina Carrillo MRN: [id number] DOB:[birth year], 56 y.o., female Today's Date: 05/03/2021   PT End of Session - 05/03/21 1340     Visit Number 1    Number of Visits 20    Date for PT Re-Evaluation 07/12/21    Authorization Type [employer] [place] $52 copay    Progress Note Due on Visit 10    PT Start Time 1345    PT Stop Time 1428    PT Time Calculation (min) 43 min    Activity Tolerance Patient tolerated treatment well    Behavior During Therapy WFL for tasks assessed/performed             Past Medical History:  Diagnosis Date   Allergy    Asthma    right knee    CHF (congestive heart failure) ([place])    cardiomyopathy   Diverticulosis    Family history of malignant neoplasm of gastrointestinal tract 02/17/2012   Father with colon cancer age 32    [place] DISEASE 02/22/2009   Qualifier: Diagnosis of  By: Ronnald Ramp, CNA, Deborah     GERD (gastroesophageal reflux disease)    pt states doesnt have recently    Hypertension    Nonischemic cardiomyopathy ([place])    [place] 01/2009: Normal coronary arteries, EF 15%. Last echo 09/2010: EF 15-17%, grade 2 diastolic dysfunction, trivial MR, mild LAE.8/14 EF 40-45%    Obesity (BMI 30-39.9) 12/07/2015   OSA (obstructive sleep apnea) 06/12/2015   Mild OSA with total AHI 8/hr and in REM sleep 26/hr with oxygen desaturations as low as 70% with respiratory events.     OSA (obstructive sleep apnea)    use CPAP nightly   Seasonal allergies    Sleep apnea    wears cpap   Past Surgical History:  Procedure Laterality Date   CESAREAN SECTION  07/11/1991   COLONOSCOPY     POLYSOMNOGRAPHY 4 OR MORE PARAMETERS  08/03/2015   TUBAL LIGATION     S/P   Patient Active Problem List   Diagnosis Date Noted   Menopause present 01/20/2018   SI (sacroiliac) joint dysfunction 05/06/2017   Lumbar radiculopathy 05/06/2017   Obesity (BMI 30-39.9) 12/07/2015   OSA (obstructive  sleep apnea) 06/12/2015   Persistent asthma 01/23/2015   Allergic rhinoconjunctivitis, seasonal and perennial 01/29/2014   Nonischemic cardiomyopathy ([place]) 08/12/2012   THYROMEGALY 61/60/7371   CHRONIC SYSTOLIC HEART FAILURE 09/25/9483   Essential hypertension 02/22/2009   LIVER HEMANGIOMA 02/15/2009    PCP: Nolene Ebbs, MD  REFERRING PROVIDER: Mcarthur Rossetti*  REFERRING DIAG: [phone] (ICD-10-CM) - Chronic pain of left knee M25.561,G89.29 (ICD-10-CM) - Chronic pain of right knee M76.62 (ICD-10-CM) - Achilles tendinitis, left leg  THERAPY DIAG:  Chronic pain of left knee  Chronic pain of right knee  Pain in left lower leg  Muscle weakness (generalized)  Difficulty in walking, not elsewhere classified  ONSET DATE: 04/01/2021 (acute on chronic)  SUBJECTIVE:   SUBJECTIVE STATEMENT: Patient indicated going to [place] "maybe 5-6 years ago" and when she came back she was limping.  Patient indicated seeing MD at different medical facility initial who indicated meniscus involvement.  Patient not having a surgery that was recommended from them.  Patient indicated doing some exercise and feeling like she improved for some time.  Patient also indicated some complaints in Lt ankle (also Rt ankle at times) along the way as well and saw someone at [facility] with some topical creams  at that time.  Has seen foot MD as well who indicated spurs, was given a boot to sleep (does help when she uses it).   More recently, walking gives the most trouble.  Currently Lt ankle/foot is primary complaint.   PERTINENT HISTORY: CHF, HTN, history of back pain (seen chiro for it)  PAIN:  Are you having pain? Yes NPRS scale: Lt lower leg(achilles) current 5/10 at worst 8/10.  Knees current: 5/10  at worst: 10/10 Pain location: bilateral knee, Lt lower leg (also can have Rt at times) Pain orientation: Bilateral  PAIN TYPE: chronic  Pain description: tightness, achy, sharp in  heel Aggravating factors: walking, wearing heels, stairs Relieving factors: night splint can help foot, topical creams  PRECAUTIONS: None  WEIGHT BEARING RESTRICTIONS No  FALLS:  Has patient fallen in last 6 months? No, Number of falls: 0   LIVING ENVIRONMENT: Lives with: lives with their family and lives with their son Lives in: House/apartment Stairs: 3 stairs to enter house with both handrails   OCCUPATION: works Librarian, academic with sitting primarily required,   PLOF: Independent included walking for exercise, had previous used gym(not using currently due to symptoms about 6 months  PATIENT GOALS Reduce pain   OBJECTIVE:   DIAGNOSTIC FINDINGS: 05/03/2021:xrays (see in chart file)  PATIENT SURVEYS:  05/03/2021:FOTO intake : 47 predicted:  80  COGNITION:  05/03/2021:Overall cognitive status: Within functional limits for tasks assessed     SENSATION:  05/03/2021:Light touch: Appears intact     PALPATION: 05/03/2021:  Tenderness  LE AROM/PROM:  A/PROM Right 05/03/2021 Left 05/03/2021  Hip flexion    Hip extension    Hip abduction    Hip adduction    Hip internal rotation    Hip external rotation    Knee flexion AROM in supine heel slide : 130  AROM in supine heel slide: 129  Knee extension AROM in seated LAQ : 0 AROM in seated LAQ : 0  Ankle dorsiflexion AROM in seated 90 deg knee flexion: 12 AROM in seated 90 deg knee flexion: 7 AROM in seated 0 deg knee extension: 0  Ankle plantarflexion AROM in seated 90 deg knee flexion: 40  AROM in seated 90 deg knee flexion: 40  Ankle inversion    Ankle eversion     (Blank rows = not tested)  LE MMT:  MMT Right 05/03/2021 Left 05/03/2021  Hip flexion 5/5 5/5  Hip extension    Hip abduction    Hip adduction    Hip internal rotation    Hip external rotation    Knee flexion    Knee extension 5/5 43, 49.8 lbs 5/5 34.31.5 lbs  Ankle dorsiflexion    Ankle plantarflexion 3/5 (5 SL heel raises 2+/5 (unable to perform SL heel raise,  pain noted)  Ankle inversion    Ankle eversion     (Blank rows = not tested)  LOWER EXTREMITY SPECIAL TESTS:  (-) windlass bilateral   FUNCTIONAL TESTS:  05/03/2021:  Lt SLS:  3 seconds  Rt SLS:  7 seconds 18 inch chair sit to stand to sit: able to perform on 1st try, reduced control in lowering Stairs: decreased eccentric control lowering on Lt primarily  GAIT: 05/03/2021: independent ambulation Comments: Mild decrease in toe off progression on Lt leg     TODAY'S TREATMENT: 05/03/2021: HEP instruction/performance c cues for techniques, handout provided.  Trial set performed of each for comprehension and symptom assessment.  See below for interventions.    PATIENT EDUCATION:  05/03/2021:Education details: HEP, POC Person educated: Patient Education method: Explanation, Demonstration, Verbal cues, and Handouts Education comprehension: verbalized understanding, returned demonstration, and tactile cues required   HOME EXERCISE PROGRAM: 05/03/2021: Access Code: [email or web address] URL: [email or web address] Date: 05/03/2021 Prepared by: Scot Jun  Exercises Seated Quad Set - 2 x daily - 7 x weekly - 3 sets - 10 reps Seated Straight Leg Heel Taps - 2 x daily - 7 x weekly - 3 sets - 10 reps Standing Eccentric Heel Raise - 2 x daily - 7 x weekly - 3 sets - 10 reps Standing Dorsiflexion Self-Mobilization on Step - 2 x daily - 7 x weekly - 1-2 sets - 10 reps - 2-3 hold Gastroc Stretch on Step - 2 x daily - 7 x weekly - 3 sets - 10 reps   ASSESSMENT:  CLINICAL IMPRESSION: Patient is a 56  y.o. who comes to clinic with complaints of bilateral knee pain, Lt lower leg pain with mobility, strength and movement coordination deficits that impair their ability to perform usual daily and recreational functional activities without increase difficulty/symptoms at this time.  Patient to benefit from skilled PT services to address impairments and limitations to improve to previous level of  function without restriction secondary to condition. Objective impairments include Abnormal gait, decreased activity tolerance, decreased balance, decreased coordination, decreased endurance, decreased mobility, difficulty walking, decreased ROM, decreased strength, increased fascial restrictions, impaired perceived functional ability, impaired flexibility, improper body mechanics, and pain. These impairments are limiting patient from community activity, occupation, yard work, and exercise . Personal factors including CHF, HTN, multiple body parts involved are also affecting patient's functional outcome. Patient will benefit from skilled PT to address above impairments and improve overall function.  REHAB POTENTIAL: Good  CLINICAL DECISION MAKING: Evolving/moderate complexity  EVALUATION COMPLEXITY: Moderate   GOALS: Goals reviewed with patient? Yes  SHORT TERM GOALS:  STG Name Target Date Goal status  1 Patient will demonstrate independent use of home exercise program to maintain progress from in clinic treatments.  05/24/2021 INITIAL                                 LONG TERM GOALS:   LTG Name Target Date Goal status  1 Patient will demonstrate/report pain at worst less than or equal to 2/10 to facilitate minimal limitation in daily activity secondary to pain symptoms.  07/12/2021 INITIAL  2 Patient will demonstrate independent use of home exercise program to facilitate ability to maintain/progress functional gains from skilled physical therapy services.  07/12/2021 INITIAL  3 Patient will demonstrate FOTO outcome > or = 54 % to indicated reduced disability due to condition. 07/12/2021 INITIAL  4 Patient will demonstrate Lt ankle AROM equal to Rt to facilitate ability to perform ambulation, stair navigation s restriction due to mobility. 07/12/2021 INITIAL  5 Patient will demonstrate bilateral LE MMT 5/5 throughout to facilitate ability to perform usual standing, walking, stairs at PLOF s  limitation due to symptoms. 07/12/2021 INITIAL  6 Patient will demonstrate/report ability to ascend/descend stairs c reciprocal gait pattern at PLOF s handrail.   07/12/2021 INITIAL  7 Patient will demonstrate bilateral sls > 30 seconds to facilitate stability in ambulation  07/12/2021 INITIAL   PLAN: PT FREQUENCY: 1-2x/week  PT DURATION: 10 weeks  PLANNED INTERVENTIONS: Therapeutic exercises, Therapeutic activity, Neuro Muscular re-education, Balance training, Gait training, Patient/Family education, Joint mobilization, Stair training, DME instructions, Dry Needling, Electrical stimulation, Cryotherapy, Moist heat,  Taping, Ultrasound, Ionotophoresis 4mg /ml Dexamethasone, and Manual therapy  PLAN FOR NEXT SESSION: Review HEP, progressive eccentric PF loading, quad strengthening.    Girtha Rm, PT 05/03/2021, 2:52 PM

## 2021-05-03 ENCOUNTER — Ambulatory Visit: Payer: BC Managed Care – PPO | Admitting: Rehabilitative and Restorative Service Providers"

## 2021-05-03 ENCOUNTER — Other Ambulatory Visit: Payer: Self-pay

## 2021-05-03 ENCOUNTER — Encounter: Payer: Self-pay | Admitting: Rehabilitative and Restorative Service Providers"

## 2021-05-03 DIAGNOSIS — M25561 Pain in right knee: Secondary | ICD-10-CM | POA: Diagnosis not present

## 2021-05-03 DIAGNOSIS — R262 Difficulty in walking, not elsewhere classified: Secondary | ICD-10-CM

## 2021-05-03 DIAGNOSIS — G8929 Other chronic pain: Secondary | ICD-10-CM

## 2021-05-03 DIAGNOSIS — M25562 Pain in left knee: Secondary | ICD-10-CM

## 2021-05-03 DIAGNOSIS — M6281 Muscle weakness (generalized): Secondary | ICD-10-CM | POA: Diagnosis not present

## 2021-05-03 DIAGNOSIS — M79662 Pain in left lower leg: Secondary | ICD-10-CM | POA: Diagnosis not present

## 2021-05-10 ENCOUNTER — Encounter: Payer: Self-pay | Admitting: Rehabilitative and Restorative Service Providers"

## 2021-05-10 ENCOUNTER — Ambulatory Visit: Payer: BC Managed Care – PPO | Admitting: Rehabilitative and Restorative Service Providers"

## 2021-05-10 ENCOUNTER — Other Ambulatory Visit: Payer: Self-pay

## 2021-05-10 DIAGNOSIS — M25561 Pain in right knee: Secondary | ICD-10-CM | POA: Diagnosis not present

## 2021-05-10 DIAGNOSIS — M6281 Muscle weakness (generalized): Secondary | ICD-10-CM

## 2021-05-10 DIAGNOSIS — M25562 Pain in left knee: Secondary | ICD-10-CM | POA: Diagnosis not present

## 2021-05-10 DIAGNOSIS — G8929 Other chronic pain: Secondary | ICD-10-CM

## 2021-05-10 DIAGNOSIS — R262 Difficulty in walking, not elsewhere classified: Secondary | ICD-10-CM

## 2021-05-10 NOTE — Therapy (Signed)
OUTPATIENT PHYSICAL THERAPY TREATMENT NOTE   Patient Name: Katrina Carrillo MRN: [id number] DOB:07-09-1965, 56 y.o., female Today's Date: 05/10/2021  PCP: Nolene Ebbs, MD REFERRING PROVIDER: Mcarthur Rossetti*   PT End of Session - 05/10/21 1806     Visit Number 2    Number of Visits 20    Date for PT Re-Evaluation 07/12/21    Authorization Type BCBS [place] $52 copay    Progress Note Due on Visit 10    PT Start Time 1148    PT Stop Time 1228    PT Time Calculation (min) 40 min    Activity Tolerance Patient tolerated treatment well;No increased pain    Behavior During Therapy [facility] for tasks assessed/performed             Past Medical History:  Diagnosis Date   Allergy    Asthma    right knee    CHF (congestive heart failure) (HCC)    cardiomyopathy   Diverticulosis    Family history of malignant neoplasm of gastrointestinal tract 02/17/2012   Father with colon cancer age 29    [place] DISEASE 02/22/2009   Qualifier: Diagnosis of  By: Ronnald Ramp, CNA, Deborah     GERD (gastroesophageal reflux disease)    pt states doesnt have recently    Hypertension    Nonischemic cardiomyopathy ([place])    [place] 01/2009: Normal coronary arteries, EF 15%. Last echo 09/2010: EF 78-29%, grade 2 diastolic dysfunction, trivial MR, mild LAE.8/14 EF 40-45%    Obesity (BMI 30-39.9) 12/07/2015   OSA (obstructive sleep apnea) 06/12/2015   Mild OSA with total AHI 8/hr and in REM sleep 26/hr with oxygen desaturations as low as 70% with respiratory events.     OSA (obstructive sleep apnea)    use CPAP nightly   Seasonal allergies    Sleep apnea    wears cpap   Past Surgical History:  Procedure Laterality Date   CESAREAN SECTION  07/11/1991   COLONOSCOPY     POLYSOMNOGRAPHY 4 OR MORE PARAMETERS  08/03/2015   TUBAL LIGATION     S/P   Patient Active Problem List   Diagnosis Date Noted   Menopause present 01/20/2018   SI (sacroiliac) joint dysfunction 05/06/2017   Lumbar  radiculopathy 05/06/2017   Obesity (BMI 30-39.9) 12/07/2015   OSA (obstructive sleep apnea) 06/12/2015   Persistent asthma 01/23/2015   Allergic rhinoconjunctivitis, seasonal and perennial 01/29/2014   Nonischemic cardiomyopathy ([place]) 08/12/2012   THYROMEGALY 56/21/3086   CHRONIC SYSTOLIC HEART FAILURE 57/84/6962   Essential hypertension 02/22/2009   LIVER HEMANGIOMA 02/15/2009    REFERRING DIAG: REFERRING DIAG: M25.562,G89.29 (ICD-10-CM) - Chronic pain of left knee M25.561,G89.29 (ICD-10-CM) - Chronic pain of right knee M76.62 (ICD-10-CM) - Achilles tendinitis, left leg  THERAPY DIAG:  Difficulty in walking, not elsewhere classified  Muscle weakness (generalized)  Chronic pain of left knee  Chronic pain of right knee  PERTINENT HISTORY: PERTINENT HISTORY: CHF, HTN, history of back pain (seen chiro for it)  PRECAUTIONS: PRECAUTIONS: None   WEIGHT BEARING RESTRICTIONS No  SUBJECTIVE: Katrina Carrillo reports compliance with her early home exercises.  PAIN:  Are you having pain? Yes NPRS scale: 5/10 Pain location: Knees Pain orientation: Bilateral  PAIN TYPE: aching Pain description: constant  Aggravating factors: Prolonged postures or WB Relieving factors: NA    OBJECTIVE:    DIAGNOSTIC FINDINGS: 05/03/2021:xrays (see in chart file)   PATIENT SURVEYS:  05/03/2021:FOTO intake : 47 predicted:  54   COGNITION:  05/03/2021:Overall cognitive status: Within functional limits for tasks assessed                            SENSATION:          05/03/2021:Light touch: Appears intact               PALPATION: 05/03/2021:  Tenderness   LE AROM/PROM:   A/PROM Right 05/03/2021 Left 05/03/2021  Hip flexion      Hip extension      Hip abduction      Hip adduction      Hip internal rotation      Hip external rotation      Knee flexion AROM in supine heel slide : 130   AROM in supine heel slide: 129  Knee extension AROM in seated LAQ : 0 AROM in seated LAQ : 0  Ankle  dorsiflexion AROM in seated 90 deg knee flexion: 12 AROM in seated 90 deg knee flexion: 7 AROM in seated 0 deg knee extension: 0  Ankle plantarflexion AROM in seated 90 deg knee flexion: 40  AROM in seated 90 deg knee flexion: 40  Ankle inversion      Ankle eversion       (Blank rows = not tested)   LE MMT:   MMT Right 05/03/2021 Left 05/03/2021  Hip flexion 5/5 5/5  Hip extension      Hip abduction      Hip adduction      Hip internal rotation      Hip external rotation      Knee flexion      Knee extension 5/5 43, 49.8 lbs 5/5 34.31.5 lbs  Ankle dorsiflexion      Ankle plantarflexion 3/5 (5 SL heel raises 2+/5 (unable to perform SL heel raise, pain noted)  Ankle inversion      Ankle eversion       (Blank rows = not tested)   LOWER EXTREMITY SPECIAL TESTS:  (-) windlass bilateral     FUNCTIONAL TESTS:  05/03/2021:  Lt SLS:  3 seconds  Rt SLS:  7 seconds 18 inch chair sit to stand to sit: able to perform on 1st try, reduced control in lowering Stairs: decreased eccentric control lowering on Lt primarily   GAIT: 05/03/2021: independent ambulation Comments: Mild decrease in toe off progression on Lt leg         TODAY'S TREATMENT:  05/10/2021:   Access Code: [email or web address] URL: [email or web address] Date: 05/10/2021 Prepared by: Vista Mink  Exercises Seated Quad Set - 2 x daily - 7 x weekly - 3 sets - 10 reps Seated Straight Leg Raises - 2 x daily - 7 x weekly - 5 sets - 5 reps Standing Eccentric Heel Raise - 2 x daily - 7 x weekly - 3 sets - 10 reps Standing Dorsiflexion Self-Mobilization on Step - 2 x daily - 7 x weekly - 1-2 sets - 10 reps - 2-3 hold Gastroc Stretch on Step - 2 x daily - 7 x weekly - 3 sets - 10 reps    05/03/2021: HEP instruction/performance c cues for techniques, handout provided.  Trial set performed of each for comprehension and symptom assessment.  See below for interventions.      PATIENT EDUCATION:  05/03/2021:Education details:  HEP, POC Person educated: Patient Education method: Explanation, Demonstration, Verbal cues, and Handouts Education comprehension: verbalized understanding, returned demonstration, and tactile cues required     HOME EXERCISE  PROGRAM: 05/03/2021: Access Code: [email or web address] URL: [email or web address] Date: 05/03/2021 Prepared by: Scot Jun   Exercises Seated Quad Set - 2 x daily - 7 x weekly - 3 sets - 10 reps Seated Straight Leg Heel Taps - 2 x daily - 7 x weekly - 3 sets - 10 reps Standing Eccentric Heel Raise - 2 x daily - 7 x weekly - 3 sets - 10 reps Standing Dorsiflexion Self-Mobilization on Step - 2 x daily - 7 x weekly - 1-2 sets - 10 reps - 2-3 hold Gastroc Stretch on Step - 2 x daily - 7 x weekly - 3 sets - 10 reps     ASSESSMENT:   CLINICAL IMPRESSION: Elliyah reports early HEP compliance.  Reviewed seated SLR as an option to complete at work as well as day 1 HEP.  Continue quadriceps strengthening emphasis to meet LTGs.   REHAB POTENTIAL: Good   CLINICAL DECISION MAKING: Evolving/moderate complexity   EVALUATION COMPLEXITY: Moderate     GOALS: Goals reviewed with patient? Yes   SHORT TERM GOALS:   STG Name Target Date Goal status  1 Patient will demonstrate independent use of home exercise program to maintain progress from in clinic treatments.  05/24/2021 INITIAL                                                          LONG TERM GOALS:    LTG Name Target Date Goal status  1 Patient will demonstrate/report pain at worst less than or equal to 2/10 to facilitate minimal limitation in daily activity secondary to pain symptoms.  07/12/2021 INITIAL  2 Patient will demonstrate independent use of home exercise program to facilitate ability to maintain/progress functional gains from skilled physical therapy services.  07/12/2021 INITIAL  3 Patient will demonstrate FOTO outcome > or = 54 % to indicated reduced disability due to condition. 07/12/2021 INITIAL   4 Patient will demonstrate Lt ankle AROM equal to Rt to facilitate ability to perform ambulation, stair navigation s restriction due to mobility. 07/12/2021 INITIAL  5 Patient will demonstrate bilateral LE MMT 5/5 throughout to facilitate ability to perform usual standing, walking, stairs at PLOF s limitation due to symptoms. 07/12/2021 INITIAL  6 Patient will demonstrate/report ability to ascend/descend stairs c reciprocal gait pattern at PLOF s handrail.    07/12/2021 INITIAL  7 Patient will demonstrate bilateral sls > 30 seconds to facilitate stability in ambulation  07/12/2021 INITIAL    PLAN: PT FREQUENCY: 1-2x/week   PT DURATION: 10 weeks   PLANNED INTERVENTIONS: Therapeutic exercises, Therapeutic activity, Neuro Muscular re-education, Balance training, Gait training, Patient/Family education, Joint mobilization, Stair training, DME instructions, Dry Needling, Electrical stimulation, Cryotherapy, Moist heat, Taping, Ultrasound, Ionotophoresis 4mg /ml Dexamethasone, and Manual therapy   PLAN FOR NEXT SESSION: Review HEP, progressive eccentric PF loading, quadriceps strengthening.      Farley Ly, PT 05/10/2021, 6:17 PM

## 2021-05-14 ENCOUNTER — Ambulatory Visit: Payer: BC Managed Care – PPO | Admitting: Rehabilitative and Restorative Service Providers"

## 2021-05-14 ENCOUNTER — Other Ambulatory Visit: Payer: Self-pay

## 2021-05-14 ENCOUNTER — Encounter: Payer: Self-pay | Admitting: Rehabilitative and Restorative Service Providers"

## 2021-05-14 DIAGNOSIS — M79662 Pain in left lower leg: Secondary | ICD-10-CM

## 2021-05-14 DIAGNOSIS — R262 Difficulty in walking, not elsewhere classified: Secondary | ICD-10-CM

## 2021-05-14 DIAGNOSIS — M25561 Pain in right knee: Secondary | ICD-10-CM

## 2021-05-14 DIAGNOSIS — M6281 Muscle weakness (generalized): Secondary | ICD-10-CM

## 2021-05-14 DIAGNOSIS — G8929 Other chronic pain: Secondary | ICD-10-CM

## 2021-05-14 DIAGNOSIS — M25562 Pain in left knee: Secondary | ICD-10-CM

## 2021-05-14 NOTE — Therapy (Signed)
OUTPATIENT PHYSICAL THERAPY TREATMENT NOTE   Patient Name: Katrina Carrillo MRN: [id number] DOB:08-07-1965, 56 y.o., female Today's Date: 05/14/2021  PCP: Nolene Ebbs, MD REFERRING PROVIDER: Mcarthur Rossetti*   PT End of Session - 05/14/21 1116     Visit Number 3    Number of Visits 20    Date for PT Re-Evaluation 07/12/21    Authorization Type BCBS [place] $52 copay    Progress Note Due on Visit 10    PT Start Time 1103    PT Stop Time 1142    PT Time Calculation (min) 39 min    Activity Tolerance Patient tolerated treatment well;No increased pain    Behavior During Therapy [facility] for tasks assessed/performed              Past Medical History:  Diagnosis Date   Allergy    Asthma    right knee    CHF (congestive heart failure) (HCC)    cardiomyopathy   Diverticulosis    Family history of malignant neoplasm of gastrointestinal tract 02/17/2012   Father with colon cancer age 44    [place] DISEASE 02/22/2009   Qualifier: Diagnosis of  By: Ronnald Ramp, CNA, Deborah     GERD (gastroesophageal reflux disease)    pt states doesnt have recently    Hypertension    Nonischemic cardiomyopathy ([place])    [place] 01/2009: Normal coronary arteries, EF 15%. Last echo 09/2010: EF 66-59%, grade 2 diastolic dysfunction, trivial MR, mild LAE.8/14 EF 40-45%    Obesity (BMI 30-39.9) 12/07/2015   OSA (obstructive sleep apnea) 06/12/2015   Mild OSA with total AHI 8/hr and in REM sleep 26/hr with oxygen desaturations as low as 70% with respiratory events.     OSA (obstructive sleep apnea)    use CPAP nightly   Seasonal allergies    Sleep apnea    wears cpap   Past Surgical History:  Procedure Laterality Date   CESAREAN SECTION  07/11/1991   COLONOSCOPY     POLYSOMNOGRAPHY 4 OR MORE PARAMETERS  08/03/2015   TUBAL LIGATION     S/P   Patient Active Problem List   Diagnosis Date Noted   Menopause present 01/20/2018   SI (sacroiliac) joint dysfunction 05/06/2017   Lumbar  radiculopathy 05/06/2017   Obesity (BMI 30-39.9) 12/07/2015   OSA (obstructive sleep apnea) 06/12/2015   Persistent asthma 01/23/2015   Allergic rhinoconjunctivitis, seasonal and perennial 01/29/2014   Nonischemic cardiomyopathy ([place]) 08/12/2012   THYROMEGALY 93/57/0177   CHRONIC SYSTOLIC HEART FAILURE 93/90/3009   Essential hypertension 02/22/2009   LIVER HEMANGIOMA 02/15/2009    REFERRING DIAG: REFERRING DIAG: M25.562,G89.29 (ICD-10-CM) - Chronic pain of left knee M25.561,G89.29 (ICD-10-CM) - Chronic pain of right knee M76.62 (ICD-10-CM) - Achilles tendinitis, left leg  THERAPY DIAG:  Chronic pain of right knee  Chronic pain of left knee  Pain in left lower leg  Muscle weakness (generalized)  Difficulty in walking, not elsewhere classified  PERTINENT HISTORY: CHF, HTN, history of back pain (seen chiro for it)  PRECAUTIONS: None   WEIGHT BEARING RESTRICTIONS No  SUBJECTIVE: Patient indicated her knees have been doing ok.  Patient indicated feeling Lt ankle more than knees.  Patient indicated new shoes have helped.   PAIN:  Are you having pain? Yes NPRS scale: Lt ankle at worst in last few days 5/10, current 2/10.   Pain location: ankle Pain orientation: Bilateral  PAIN TYPE: aching Pain description: constant  Aggravating factors: Prolonged postures or WB Relieving factors: NA  OBJECTIVE:    DIAGNOSTIC FINDINGS: 05/03/2021:xrays (see in chart file)   PATIENT SURVEYS:  05/03/2021:FOTO intake : 47 predicted:  33   COGNITION:          05/03/2021:Overall cognitive status: Within functional limits for tasks assessed                            SENSATION:          05/03/2021:Light touch: Appears intact               PALPATION: 05/03/2021:  Tenderness   LE AROM/PROM:   A/PROM Right 05/03/2021 Left 05/03/2021  Hip flexion      Hip extension      Hip abduction      Hip adduction      Hip internal rotation      Hip external rotation      Knee flexion AROM in  supine heel slide : 130   AROM in supine heel slide: 129  Knee extension AROM in seated LAQ : 0 AROM in seated LAQ : 0  Ankle dorsiflexion AROM in seated 90 deg knee flexion: 12 AROM in seated 90 deg knee flexion: 7 AROM in seated 0 deg knee extension: 0  Ankle plantarflexion AROM in seated 90 deg knee flexion: 40  AROM in seated 90 deg knee flexion: 40  Ankle inversion      Ankle eversion       (Blank rows = not tested)   LE MMT:   MMT Right 05/03/2021 Left 05/03/2021 Left 05/14/2021  Hip flexion 5/5 5/5   Hip extension       Hip abduction       Hip adduction       Hip internal rotation       Hip external rotation       Knee flexion       Knee extension 5/5 43, 49.8 lbs 5/5 34.31.5 lbs   Ankle dorsiflexion       Ankle plantarflexion 3/5 (5 SL heel raises 2+/5 (unable to perform SL heel raise, pain noted) 3/5 c pain noted   Ankle inversion       Ankle eversion        (Blank rows = not tested)   LOWER EXTREMITY SPECIAL TESTS:  (-) windlass bilateral     FUNCTIONAL TESTS:  05/03/2021:  Lt SLS:  3 seconds  Rt SLS:  7 seconds 18 inch chair sit to stand to sit: able to perform on 1st try, reduced control in lowering Stairs: decreased eccentric control lowering on Lt primarily   GAIT: 05/03/2021: independent ambulation Comments: Mild decrease in toe off progression on Lt leg         TODAY'S TREATMENT:  05/14/2021:   Therex  Recumbent bike lvl 2 6 mins seat 5    Double leg PF, eccentric lowering on Lt c bilateral hand on rail 2 x 10 (cues for slow control)    Gastroc stretch incline board 30 sec x 3 knee straight, knee bent 30 sec x 3 Leg press double leg 2 x 15 75 lbs, single leg 37 lbs 2 x 10 bilaterally (cues for gym use) Continued verbal review of HEP to ensure techniques.    Neuro re-ed:  Tandem stance 1 min x 1 bilateral, fitter rocker board 30 x c occasional assist to prevent loss of balance.  05/10/2021:   Access Code: [email or web address] URL:  [email or web address] Date: 05/10/2021   Exercises  Seated Quad Set - 2 x daily - 7 x weekly - 3 sets - 10 reps Seated Straight Leg Raises - 2 x daily - 7 x weekly - 5 sets - 5 reps Standing Eccentric Heel Raise - 2 x daily - 7 x weekly - 3 sets - 10 reps Standing Dorsiflexion Self-Mobilization on Step - 2 x daily - 7 x weekly - 1-2 sets - 10 reps - 2-3 hold Gastroc Stretch on Step - 2 x daily - 7 x weekly - 3 sets - 10 reps    05/03/2021: HEP instruction/performance c cues for techniques, handout provided.  Trial set performed of each for comprehension and symptom assessment.  See below for interventions.      PATIENT EDUCATION:  05/03/2021:Education details: HEP, POC Person educated: Patient Education method: Explanation, Demonstration, Verbal cues, and Handouts Education comprehension: verbalized understanding, returned demonstration, and tactile cues required     HOME EXERCISE PROGRAM: 05/03/2021: Access Code: [email or web address] URL: [email or web address] Date: 05/03/2021 Prepared by: Scot Jun   Exercises Seated Quad Set - 2 x daily - 7 x weekly - 3 sets - 10 reps Seated Straight Leg Heel Taps - 2 x daily - 7 x weekly - 3 sets - 10 reps Standing Eccentric Heel Raise - 2 x daily - 7 x weekly - 3 sets - 10 reps Standing Dorsiflexion Self-Mobilization on Step - 2 x daily - 7 x weekly - 1-2 sets - 10 reps - 2-3 hold Gastroc Stretch on Step - 2 x daily - 7 x weekly - 3 sets - 10 reps     ASSESSMENT:   CLINICAL IMPRESSION: Progress was noted in PF strength testing and overall symptom reporting to this point. Continued review of HEP required to help promote consistent use.  Reviewed ideas of return to gym for exercise routine as well.  Continued skilled PT services indicated at this time to continue progression.    REHAB POTENTIAL: Good   CLINICAL DECISION MAKING: Evolving/moderate complexity   EVALUATION COMPLEXITY: Moderate     GOALS: Goals reviewed  with patient? Yes   SHORT TERM GOALS:   STG Name Target Date Goal status  1 Patient will demonstrate independent use of home exercise program to maintain progress from in clinic treatments.  05/24/2021 On going Assessed 05/14/2021                                                          LONG TERM GOALS:    LTG Name Target Date Goal status  1 Patient will demonstrate/report pain at worst less than or equal to 2/10 to facilitate minimal limitation in daily activity secondary to pain symptoms.  07/12/2021 INITIAL  2 Patient will demonstrate independent use of home exercise program to facilitate ability to maintain/progress functional gains from skilled physical therapy services.  07/12/2021 INITIAL  3 Patient will demonstrate FOTO outcome > or = 54 % to indicated reduced disability due to condition. 07/12/2021 INITIAL  4 Patient will demonstrate Lt ankle AROM equal to Rt to facilitate ability to perform ambulation, stair navigation s restriction due to mobility. 07/12/2021 INITIAL  5 Patient will demonstrate bilateral LE MMT 5/5 throughout to facilitate ability to perform usual standing, walking, stairs at PLOF s limitation due to symptoms. 07/12/2021 INITIAL  6 Patient will demonstrate/report ability to ascend/descend stairs  c reciprocal gait pattern at PLOF s handrail.    07/12/2021 INITIAL  7 Patient will demonstrate bilateral sls > 30 seconds to facilitate stability in ambulation  07/12/2021 INITIAL    PLAN: PT FREQUENCY: 1-2x/week   PT DURATION: 10 weeks   PLANNED INTERVENTIONS: Therapeutic exercises, Therapeutic activity, Neuro Muscular re-education, Balance training, Gait training, Patient/Family education, Joint mobilization, Stair training, DME instructions, Dry Needling, Electrical stimulation, Cryotherapy, Moist heat, Taping, Ultrasound, Ionotophoresis 4mg /ml Dexamethasone, and Manual therapy   PLAN FOR NEXT SESSION: Continued eccentric control strengthening throughout LE, continued  encouragement for HEP and gym return.     Scot Jun, PT, DPT, OCS, ATC 05/14/21  11:46 AM

## 2021-05-21 ENCOUNTER — Encounter: Payer: Self-pay | Admitting: Rehabilitative and Restorative Service Providers"

## 2021-05-21 ENCOUNTER — Ambulatory Visit: Payer: BC Managed Care – PPO | Admitting: Rehabilitative and Restorative Service Providers"

## 2021-05-21 ENCOUNTER — Other Ambulatory Visit: Payer: Self-pay

## 2021-05-21 DIAGNOSIS — M25562 Pain in left knee: Secondary | ICD-10-CM

## 2021-05-21 DIAGNOSIS — R262 Difficulty in walking, not elsewhere classified: Secondary | ICD-10-CM

## 2021-05-21 DIAGNOSIS — M25561 Pain in right knee: Secondary | ICD-10-CM

## 2021-05-21 DIAGNOSIS — G8929 Other chronic pain: Secondary | ICD-10-CM

## 2021-05-21 DIAGNOSIS — M79662 Pain in left lower leg: Secondary | ICD-10-CM | POA: Diagnosis not present

## 2021-05-21 DIAGNOSIS — M6281 Muscle weakness (generalized): Secondary | ICD-10-CM | POA: Diagnosis not present

## 2021-05-21 NOTE — Therapy (Addendum)
OUTPATIENT PHYSICAL THERAPY TREATMENT NOTE   Patient Name: Katrina Carrillo MRN: [id number] DOB:29-Mar-1966, 56 y.o., female Today's Date: 05/21/2021  PCP: Nolene Ebbs, MD REFERRING PROVIDER: Mcarthur Rossetti*   PT End of Session - 05/21/21 1209     Visit Number 4    Number of Visits 20    Date for PT Re-Evaluation 07/12/21    Authorization Type [employer]BS [place] $52 copay    Progress Note Due on Visit 10    PT Start Time 1144    PT Stop Time 0539    PT Time Calculation (min) 40 min    Activity Tolerance Patient tolerated treatment well    Behavior During Therapy [facility] for tasks assessed/performed               Past Medical History:  Diagnosis Date   Allergy    Asthma    right knee    CHF (congestive heart failure) ([place])    cardiomyopathy   Diverticulosis    Family history of malignant neoplasm of gastrointestinal tract 02/17/2012   Father with colon cancer age 1    [place] DISEASE 02/22/2009   Qualifier: Diagnosis of  By: Ronnald Ramp, CNA, Deborah     GERD (gastroesophageal reflux disease)    pt states doesnt have recently    Hypertension    Nonischemic cardiomyopathy ([place])    [place] 01/2009: Normal coronary arteries, EF 15%. Last echo 09/2010: EF 76-73%, grade 2 diastolic dysfunction, trivial MR, mild LAE.8/14 EF 40-45%    Obesity (BMI 30-39.9) 12/07/2015   OSA (obstructive sleep apnea) 06/12/2015   Mild OSA with total AHI 8/hr and in REM sleep 26/hr with oxygen desaturations as low as 70% with respiratory events.     OSA (obstructive sleep apnea)    use CPAP nightly   Seasonal allergies    Sleep apnea    wears cpap   Past Surgical History:  Procedure Laterality Date   CESAREAN SECTION  07/11/1991   COLONOSCOPY     POLYSOMNOGRAPHY 4 OR MORE PARAMETERS  08/03/2015   TUBAL LIGATION     S/P   Patient Active Problem List   Diagnosis Date Noted   Menopause present 01/20/2018   SI (sacroiliac) joint dysfunction 05/06/2017   Lumbar radiculopathy  05/06/2017   Obesity (BMI 30-39.9) 12/07/2015   OSA (obstructive sleep apnea) 06/12/2015   Persistent asthma 01/23/2015   Allergic rhinoconjunctivitis, seasonal and perennial 01/29/2014   Nonischemic cardiomyopathy ([place]) 08/12/2012   THYROMEGALY 41/93/7902   CHRONIC SYSTOLIC HEART FAILURE 40/97/3532   Essential hypertension 02/22/2009   LIVER HEMANGIOMA 02/15/2009    REFERRING DIAG: REFERRING DIAG: M25.562,G89.29 (ICD-10-CM) - Chronic pain of left knee M25.561,G89.29 (ICD-10-CM) - Chronic pain of right knee M76.62 (ICD-10-CM) - Achilles tendinitis, left leg  THERAPY DIAG:  Chronic pain of left knee  Chronic pain of right knee  Pain in left lower leg  Muscle weakness (generalized)  Difficulty in walking, not elsewhere classified  PERTINENT HISTORY: CHF, HTN, history of back pain (seen chiro for it)  PRECAUTIONS: None   WEIGHT BEARING RESTRICTIONS No  SUBJECTIVE: Patient indicated she was able to go out and dance some this weekend.  Burning complaints arent as noticeable as they were.  Still having harder time on Lt vs. Rt.   PAIN:  Are you having pain? Yes NPRS scale: Lt ankle 4-5/10 when hurting Pain location: ankle Pain orientation: Bilateral  PAIN TYPE: aching Pain description: constant  Aggravating factors: Prolonged postures or WB Relieving factors: resting  OBJECTIVE:    DIAGNOSTIC FINDINGS: 05/03/2021:xrays (see in chart file)   PATIENT SURVEYS:  05/03/2021:FOTO intake : 47 predicted:  27   COGNITION:          05/03/2021:Overall cognitive status: Within functional limits for tasks assessed                            SENSATION:          05/03/2021:Light touch: Appears intact               PALPATION: 05/03/2021:  Tenderness   LE AROM/PROM:   A/PROM Right 05/03/2021 Left 05/03/2021  Hip flexion      Hip extension      Hip abduction      Hip adduction      Hip internal rotation      Hip external rotation      Knee flexion AROM in supine heel slide  : 130   AROM in supine heel slide: 129  Knee extension AROM in seated LAQ : 0 AROM in seated LAQ : 0  Ankle dorsiflexion AROM in seated 90 deg knee flexion: 12 AROM in seated 90 deg knee flexion: 7 AROM in seated 0 deg knee extension: 0  Ankle plantarflexion AROM in seated 90 deg knee flexion: 40  AROM in seated 90 deg knee flexion: 40  Ankle inversion      Ankle eversion       (Blank rows = not tested)   LE MMT:   MMT Right 05/03/2021 Left 05/03/2021 Left 05/14/2021  Hip flexion 5/5 5/5   Hip extension       Hip abduction       Hip adduction       Hip internal rotation       Hip external rotation       Knee flexion       Knee extension 5/5 43, 49.8 lbs 5/5 34.31.5 lbs   Ankle dorsiflexion       Ankle plantarflexion 3/5 (5 SL heel raises 2+/5 (unable to perform SL heel raise, pain noted) 3/5 c pain noted   Ankle inversion       Ankle eversion        (Blank rows = not tested)   LOWER EXTREMITY SPECIAL TESTS:  (-) windlass bilateral     FUNCTIONAL TESTS:  05/21/2021:  Rt SLS: 4 seconds  Lt SLS: 9 seconds  05/03/2021:  Lt SLS:  3 seconds  Rt SLS:  7 seconds 18 inch chair sit to stand to sit: able to perform on 1st try, reduced control in lowering Stairs: decreased eccentric control lowering on Lt primarily   GAIT: 05/03/2021: independent ambulation Comments: Mild decrease in toe off progression on Lt leg         TODAY'S TREATMENT:  05/21/2021:   Therex  Recumbent bike lvl 2 6 mins seat 5    Double leg PF, eccentric lowering on Lt c bilateral hand on rail 2 x 10 (cues for slow control)    Gastroc stretch incline board 30 sec x 3 Continued verbal review of HEP to ensure techniques.    Neuro re-ed:  SLS 30 sec x3 bilateral c HHA on bar as needed ( cues for home use) Tandem stance 1 min x 2 bilateral on foam occasional hands  fitter rocker board 30 x c occasional assist to prevent loss of balance.  05/14/2021:   Therex  Recumbent bike lvl 2 6  mins seat 5    Double leg  PF, eccentric lowering on Lt c bilateral hand on rail 2 x 10 (cues for slow control)    Gastroc stretch incline board 30 sec x 3 knee straight, knee bent 30 sec x 3 Leg press double leg 2 x 15 75 lbs, single leg 37 lbs 2 x 10 bilaterally (cues for gym use) Continued verbal review of HEP to ensure techniques.    Neuro re-ed:  Tandem stance 1 min x 1 bilateral, fitter rocker board 30 x c occasional assist to prevent loss of balance.  05/10/2021:   Access Code: [email or web address] URL: [email or web address] Date: 05/10/2021   Exercises Seated Quad Set - 2 x daily - 7 x weekly - 3 sets - 10 reps Seated Straight Leg Raises - 2 x daily - 7 x weekly - 5 sets - 5 reps Standing Eccentric Heel Raise - 2 x daily - 7 x weekly - 3 sets - 10 reps Standing Dorsiflexion Self-Mobilization on Step - 2 x daily - 7 x weekly - 1-2 sets - 10 reps - 2-3 hold Gastroc Stretch on Step - 2 x daily - 7 x weekly - 3 sets - 10 reps    05/03/2021: HEP instruction/performance c cues for techniques, handout provided.  Trial set performed of each for comprehension and symptom assessment.  See below for interventions.      PATIENT EDUCATION:  05/03/2021:Education details: HEP, POC Person educated: Patient Education method: Explanation, Demonstration, Verbal cues, and Handouts Education comprehension: verbalized understanding, returned demonstration, and tactile cues required     HOME EXERCISE PROGRAM: 05/03/2021: Access Code: [email or web address] URL: [email or web address] Date: 05/03/2021 Prepared by: Scot Jun   Exercises Seated Quad Set - 2 x daily - 7 x weekly - 3 sets - 10 reps Seated Straight Leg Heel Taps - 2 x daily - 7 x weekly - 3 sets - 10 reps Standing Eccentric Heel Raise - 2 x daily - 7 x weekly - 3 sets - 10 reps Standing Dorsiflexion Self-Mobilization on Step - 2 x daily - 7 x weekly - 1-2 sets - 10 reps - 2-3 hold Gastroc Stretch on Step - 2 x daily - 7 x weekly - 3 sets - 10 reps      ASSESSMENT:   CLINICAL IMPRESSION: Increased focus on performance of movement coordination/balance improvements (needed bilateral).  Patient to continue to benefit from improved strength and control to facilitate improved functional movement.    REHAB POTENTIAL: Good   CLINICAL DECISION MAKING: Evolving/moderate complexity   EVALUATION COMPLEXITY: Moderate     GOALS: Goals reviewed with patient? Yes (eval 05/03/2021)   SHORT TERM GOALS:   STG Name Target Date Goal status  1 Patient will demonstrate independent use of home exercise program to maintain progress from in clinic treatments.  05/24/2021 On going Assessed 05/14/2021                                                          LONG TERM GOALS:    LTG Name Target Date Goal status  1 Patient will demonstrate/report pain at worst less than or equal to 2/10 to facilitate minimal limitation in daily activity secondary to pain symptoms.  07/12/2021 INITIAL  2 Patient will demonstrate independent use of home exercise program to facilitate ability to maintain/progress  functional gains from skilled physical therapy services.  07/12/2021 INITIAL  3 Patient will demonstrate FOTO outcome > or = 54 % to indicated reduced disability due to condition. 07/12/2021 INITIAL  4 Patient will demonstrate Lt ankle AROM equal to Rt to facilitate ability to perform ambulation, stair navigation s restriction due to mobility. 07/12/2021 INITIAL  5 Patient will demonstrate bilateral LE MMT 5/5 throughout to facilitate ability to perform usual standing, walking, stairs at PLOF s limitation due to symptoms. 07/12/2021 INITIAL  6 Patient will demonstrate/report ability to ascend/descend stairs c reciprocal gait pattern at PLOF s handrail.    07/12/2021 INITIAL  7 Patient will demonstrate bilateral sls > 30 seconds to facilitate stability in ambulation  07/12/2021 INITIAL    PLAN: PT FREQUENCY: 1-2x/week   PT DURATION: 10 weeks   PLANNED INTERVENTIONS:  Therapeutic exercises, Therapeutic activity, Neuro Muscular re-education, Balance training, Gait training, Patient/Family education, Joint mobilization, Stair training, DME instructions, Dry Needling, Electrical stimulation, Cryotherapy, Moist heat, Taping, Ultrasound, Ionotophoresis 4mg /ml Dexamethasone, and Manual therapy   PLAN FOR NEXT SESSION:  Progressive balance control (compliant surface, dynamic stability control) .  Reassess FOTO?    Scot Jun, PT, DPT, OCS, ATC 05/21/21  12:21 PM

## 2021-05-28 ENCOUNTER — Encounter: Payer: Self-pay | Admitting: Rehabilitative and Restorative Service Providers"

## 2021-05-28 ENCOUNTER — Ambulatory Visit: Payer: BC Managed Care – PPO | Admitting: Rehabilitative and Restorative Service Providers"

## 2021-05-28 ENCOUNTER — Other Ambulatory Visit: Payer: Self-pay

## 2021-05-28 DIAGNOSIS — R262 Difficulty in walking, not elsewhere classified: Secondary | ICD-10-CM

## 2021-05-28 DIAGNOSIS — M25562 Pain in left knee: Secondary | ICD-10-CM | POA: Diagnosis not present

## 2021-05-28 DIAGNOSIS — M79662 Pain in left lower leg: Secondary | ICD-10-CM | POA: Diagnosis not present

## 2021-05-28 DIAGNOSIS — M6281 Muscle weakness (generalized): Secondary | ICD-10-CM

## 2021-05-28 DIAGNOSIS — G8929 Other chronic pain: Secondary | ICD-10-CM

## 2021-05-28 DIAGNOSIS — M25561 Pain in right knee: Secondary | ICD-10-CM

## 2021-05-28 NOTE — Therapy (Signed)
OUTPATIENT PHYSICAL THERAPY TREATMENT NOTE   Patient Name: Katrina Carrillo MRN: [id number] DOB:1966/03/17, 56 y.o., female Today's Date: 05/28/2021  PCP: Nolene Ebbs, MD REFERRING PROVIDER: Mcarthur Rossetti*  Progress Note Reporting Period 05/03/2021  to 05/28/2021  See note below for Objective Data and Assessment of Progress/Goals.       PT End of Session - 05/28/21 1058     Visit Number 5    Number of Visits 20    Date for PT Re-Evaluation 07/12/21    Authorization Type BCBS [place] $52 copay    Progress Note Due on Visit 10    PT Start Time 1059    PT Stop Time 1138    PT Time Calculation (min) 39 min    Activity Tolerance Patient tolerated treatment well    Behavior During Therapy WFL for tasks assessed/performed                Past Medical History:  Diagnosis Date   Allergy    Asthma    right knee    CHF (congestive heart failure) ([place])    cardiomyopathy   Diverticulosis    Family history of malignant neoplasm of gastrointestinal tract 02/17/2012   Father with colon cancer age 65    [place] DISEASE 02/22/2009   Qualifier: Diagnosis of  By: Ronnald Ramp, CNA, Deborah     GERD (gastroesophageal reflux disease)    pt states doesnt have recently    Hypertension    Nonischemic cardiomyopathy ([place])    [place] 01/2009: Normal coronary arteries, EF 15%. Last echo 09/2010: EF 40-98%, grade 2 diastolic dysfunction, trivial MR, mild LAE.8/14 EF 40-45%    Obesity (BMI 30-39.9) 12/07/2015   OSA (obstructive sleep apnea) 06/12/2015   Mild OSA with total AHI 8/hr and in REM sleep 26/hr with oxygen desaturations as low as 70% with respiratory events.     OSA (obstructive sleep apnea)    use CPAP nightly   Seasonal allergies    Sleep apnea    wears cpap   Past Surgical History:  Procedure Laterality Date   CESAREAN SECTION  07/11/1991   COLONOSCOPY     POLYSOMNOGRAPHY 4 OR MORE PARAMETERS  08/03/2015   TUBAL LIGATION     S/P   Patient Active  Problem List   Diagnosis Date Noted   Menopause present 01/20/2018   SI (sacroiliac) joint dysfunction 05/06/2017   Lumbar radiculopathy 05/06/2017   Obesity (BMI 30-39.9) 12/07/2015   OSA (obstructive sleep apnea) 06/12/2015   Persistent asthma 01/23/2015   Allergic rhinoconjunctivitis, seasonal and perennial 01/29/2014   Nonischemic cardiomyopathy ([place]) 08/12/2012   THYROMEGALY 11/91/4782   CHRONIC SYSTOLIC HEART FAILURE 95/62/1308   Essential hypertension 02/22/2009   LIVER HEMANGIOMA 02/15/2009    REFERRING DIAG: REFERRING DIAG: M25.562,G89.29 (ICD-10-CM) - Chronic pain of left knee M25.561,G89.29 (ICD-10-CM) - Chronic pain of right knee M76.62 (ICD-10-CM) - Achilles tendinitis, left leg  THERAPY DIAG:  Chronic pain of left knee  Chronic pain of right knee  Pain in left lower leg  Muscle weakness (generalized)  Difficulty in walking, not elsewhere classified  PERTINENT HISTORY: CHF, HTN, history of back pain (seen chiro for it)  PRECAUTIONS: None   WEIGHT BEARING RESTRICTIONS No  SUBJECTIVE: Pt indicated global rating of change +3 somewhat better overall since start of treatment.  Pt indicated she did have "a little pain today" but was unsure of why.  Pt indicated most trouble at this time is with prolonged walking at times.   PAIN:  Are you having pain? Yes NPRS scale: Lt ankle 6/10 when hurting Pain location: ankle Pain orientation: Bilateral  PAIN TYPE: aching Pain description: constant  Aggravating factors: unsure Relieving factors: resting    OBJECTIVE:    DIAGNOSTIC FINDINGS: 05/03/2021:xrays (see in chart file)   PATIENT SURVEYS:  05/28/2021: FOTO update 42   05/03/2021:FOTO intake : 47 predicted:  54   COGNITION:          05/03/2021:Overall cognitive status: Within functional limits for tasks assessed                            SENSATION:          05/03/2021:Light touch: Appears intact               PALPATION: 05/03/2021:  Tenderness   LE  AROM/PROM:   A/PROM Right 05/03/2021 Left 05/03/2021 Left 05/28/2021  Hip flexion       Hip extension       Hip abduction       Hip adduction       Hip internal rotation       Hip external rotation       Knee flexion AROM in supine heel slide : 130   AROM in supine heel slide: 129   Knee extension AROM in seated LAQ : 0 AROM in seated LAQ : 0   Ankle dorsiflexion AROM in seated 90 deg knee flexion: 12 AROM in seated 90 deg knee flexion: 7 AROM in seated 0 deg knee extension: 0 AROM in seated 90 deg knee flexion: 10 AROM in seated 0 deg knee extension: 5  Ankle plantarflexion AROM in seated 90 deg knee flexion: 40  AROM in seated 90 deg knee flexion: 40   Ankle inversion       Ankle eversion        (Blank rows = not tested)   LE MMT:   MMT Right 05/03/2021 Left 05/03/2021 Left 05/14/2021 Left 05/28/2021  Hip flexion 5/5 5/5    Hip extension        Hip abduction        Hip adduction        Hip internal rotation        Hip external rotation        Knee flexion        Knee extension 5/5 43, 49.8 lbs 5/5 34.31.5 lbs    Ankle dorsiflexion        Ankle plantarflexion 3/5 (5 SL heel raises 2+/5 (unable to perform SL heel raise, pain noted) 3/5 c pain noted  3/5 c pain (able to perform single leg raise on Lt)  Ankle inversion        Ankle eversion         (Blank rows = not tested)   LOWER EXTREMITY SPECIAL TESTS:  (-) windlass bilateral     FUNCTIONAL TESTS:  05/21/2021:  Rt SLS: 4 seconds  Lt SLS: 9 seconds  05/03/2021:  Lt SLS:  3 seconds  Rt SLS:  7 seconds 18 inch chair sit to stand to sit: able to perform on 1st try, reduced control in lowering Stairs: decreased eccentric control lowering on Lt primarily   GAIT: 05/03/2021: independent ambulation Comments: Mild decrease in toe off progression on Lt leg         TODAY'S TREATMENT: 05/28/2021:   Therex  Recumbent bike lvl 2 8 mins seat 4    Gastroc stretch incline  board 1 min x 3 bilateral   Neuro re-ed:  Heel toe  raises on foam pad occasional hand assist 20x  Fitter rocker board fwd/back x 30 (slow focus)   Manual  Percussive device STM Lt calf  [place]:   Dexamethasone at home patch 4-6 hours Lt achilles 1.0 mL (verbal education for patch wearing/removal at home)   05/21/2021:   Therex  Recumbent bike lvl 2 6 mins seat 5    Double leg PF, eccentric lowering on Lt c bilateral hand on rail 2 x 10 (cues for slow control)    Gastroc stretch incline board 30 sec x 3 Continued verbal review of HEP to ensure techniques.    Neuro re-ed:  SLS 30 sec x3 bilateral c HHA on bar as needed ( cues for home use) Tandem stance 1 min x 2 bilateral on foam occasional hands  fitter rocker board 30 x c occasional assist to prevent loss of balance.  05/14/2021:   Therex  Recumbent bike lvl 2 6 mins seat 5    Double leg PF, eccentric lowering on Lt c bilateral hand on rail 2 x 10 (cues for slow control)    Gastroc stretch incline board 30 sec x 3 knee straight, knee bent 30 sec x 3 Leg press double leg 2 x 15 75 lbs, single leg 37 lbs 2 x 10 bilaterally (cues for gym use) Continued verbal review of HEP to ensure techniques.    Neuro re-ed:  Tandem stance 1 min x 1 bilateral, fitter rocker board 30 x c occasional assist to prevent loss of balance.  05/10/2021:   Access Code: [email or web address] URL: [email or web address] Date: 05/10/2021   Exercises Seated Quad Set - 2 x daily - 7 x weekly - 3 sets - 10 reps Seated Straight Leg Raises - 2 x daily - 7 x weekly - 5 sets - 5 reps Standing Eccentric Heel Raise - 2 x daily - 7 x weekly - 3 sets - 10 reps Standing Dorsiflexion Self-Mobilization on Step - 2 x daily - 7 x weekly - 1-2 sets - 10 reps - 2-3 hold Gastroc Stretch on Step - 2 x daily - 7 x weekly - 3 sets - 10 reps    05/03/2021: HEP instruction/performance c cues for techniques, handout provided.  Trial set performed of each for comprehension and symptom assessment.  See below for interventions.       PATIENT EDUCATION:  05/03/2021:Education details: HEP, POC Person educated: Patient Education method: Explanation, Demonstration, Verbal cues, and Handouts Education comprehension: verbalized understanding, returned demonstration, and tactile cues required     HOME EXERCISE PROGRAM: 05/03/2021: Access Code: [email or web address] URL: [email or web address] Date: 05/03/2021 Prepared by: Scot Jun   Exercises Seated Quad Set - 2 x daily - 7 x weekly - 3 sets - 10 reps Seated Straight Leg Heel Taps - 2 x daily - 7 x weekly - 3 sets - 10 reps Standing Eccentric Heel Raise - 2 x daily - 7 x weekly - 3 sets - 10 reps Standing Dorsiflexion Self-Mobilization on Step - 2 x daily - 7 x weekly - 1-2 sets - 10 reps - 2-3 hold Gastroc Stretch on Step - 2 x daily - 7 x weekly - 3 sets - 10 reps     ASSESSMENT:   CLINICAL IMPRESSION: Reassessment of objective data showed mild gains in Lt ankle mobility/strength compared to evaluation presentation.  FOTO reassessment did not show improvement compared to evaluation despite verbal indications in subjective data.  Gastroc trigger points numerous on Lt compared to Rt.  Continued skilled PT services indicated to progress towards long term goals.    REHAB POTENTIAL: Good   CLINICAL DECISION MAKING: Evolving/moderate complexity   EVALUATION COMPLEXITY: Moderate     GOALS: Goals reviewed with patient? Yes (eval 05/03/2021)   SHORT TERM GOALS:   STG Name Target Date Goal status  1 Patient will demonstrate independent use of home exercise program to maintain progress from in clinic treatments.  05/24/2021 Met Assessed 05/28/2021                                                          LONG TERM GOALS:    LTG Name Target Date Goal status  1 Patient will demonstrate/report pain at worst less than or equal to 2/10 to facilitate minimal limitation in daily activity secondary to pain symptoms.  07/12/2021 On going 05/28/2021  2 Patient will  demonstrate independent use of home exercise program to facilitate ability to maintain/progress functional gains from skilled physical therapy services.  07/12/2021 On going 05/28/2021  3 Patient will demonstrate FOTO outcome > or = 54 % to indicated reduced disability due to condition. 07/12/2021 On going 05/28/2021  4 Patient will demonstrate Lt ankle AROM equal to Rt to facilitate ability to perform ambulation, stair navigation s restriction due to mobility. 07/12/2021 On going 05/28/2021  5 Patient will demonstrate bilateral LE MMT 5/5 throughout to facilitate ability to perform usual standing, walking, stairs at PLOF s limitation due to symptoms. 07/12/2021 On going 05/28/2021  6 Patient will demonstrate/report ability to ascend/descend stairs c reciprocal gait pattern at PLOF s handrail.    07/12/2021 On going 05/28/2021  7 Patient will demonstrate bilateral sls > 30 seconds to facilitate stability in ambulation  07/12/2021 On going 05/28/2021    PLAN: PT FREQUENCY: 1-2x/week   PT DURATION: 10 weeks   PLANNED INTERVENTIONS: Therapeutic exercises, Therapeutic activity, Neuro Muscular re-education, Balance training, Gait training, Patient/Family education, Joint mobilization, Stair training, DME instructions, Dry Needling, Electrical stimulation, Cryotherapy, Moist heat, Taping, Ultrasound, Ionotophoresis 4mg /ml Dexamethasone, and Manual therapy   PLAN FOR NEXT SESSION:  Manual for soft tissue mobilization Lt calf (percussive device, dry needling maybe), ionto again.     Scot Jun, PT, DPT, OCS, ATC 05/28/21  11:33 AM

## 2021-06-02 ENCOUNTER — Other Ambulatory Visit: Payer: Self-pay | Admitting: Internal Medicine

## 2021-06-04 ENCOUNTER — Encounter: Payer: Self-pay | Admitting: Orthopaedic Surgery

## 2021-06-04 ENCOUNTER — Ambulatory Visit (INDEPENDENT_AMBULATORY_CARE_PROVIDER_SITE_OTHER): Payer: BC Managed Care – PPO | Admitting: Orthopaedic Surgery

## 2021-06-04 ENCOUNTER — Other Ambulatory Visit: Payer: Self-pay

## 2021-06-04 DIAGNOSIS — M7662 Achilles tendinitis, left leg: Secondary | ICD-10-CM

## 2021-06-04 DIAGNOSIS — M25561 Pain in right knee: Secondary | ICD-10-CM

## 2021-06-04 DIAGNOSIS — G8929 Other chronic pain: Secondary | ICD-10-CM | POA: Diagnosis not present

## 2021-06-04 DIAGNOSIS — M25562 Pain in left knee: Secondary | ICD-10-CM | POA: Diagnosis not present

## 2021-06-04 NOTE — Progress Notes (Signed)
The patient returns for follow-up after having physical therapy on both of her knees and mainly her left Achilles tendon and plantar fascia.  She says she is a work in progress but his made improvements due to outpatient physical therapy.  She is pleased with her therapist and what they are trying to help her do.  She has also lost about 6 pounds.  I think this is going to help with her knees for sure.  She says her knees are nonissue the way her Achilles tendinitis is.  I did notice she has good supportive shoes today. ? ?Her Achilles is intact on the left side but it is certainly still painful at this insertion.  Overall she does look better. ? ?I recommended that she get online and look for any type of Achilles braces wearing her night splint that she can wear at night I think that will also help.  She should continue therapy for a few more visits since that is helping and they can transition her to a home exercise program.  Follow-up is as needed. ?

## 2021-06-04 NOTE — Telephone Encounter (Signed)
Rx(s) sent to pharmacy electronically.  

## 2021-06-18 NOTE — Progress Notes (Signed)
? ?Cardiology Office Note ? ? ?Date:  06/19/2021  ? ?ID:  Katrina Carrillo, DOB Jan 11, 1966, MRN [id number] ? ?PCP:  Nolene Ebbs, MD  ?Cardiologist:   Dorris Carnes, MD  ? ?F/U of mild LV dysfunction and HTN   ? ?  ?History of Present Illness: ?Katrina Carrillo is a 56 y.o. female with a history of NICM, HTN.  Normal coronary arteries at cath in 2010  LVEF 15%  Last echo in Oct 2019 40 to 45%  She also has history of HTN   ? ?The pt  went to ED on 12/13/2018 with swelling, angioedemia      ACE inhibitor stopped   ? ?The pt says 3 wks prior to ED visit she had hives in arms   Seen by Derrm     Also had wooziness Then woke up one morning   Got out of shower   Lips were  swollen    No swelling in throat  No wheezing    ? ?The pt was last seen in cardiology by Gerrianne Scale July 2021] ? ? ?Pt had been under increased stress   Mother died in 2020-12-13  Very stressful ?No dizzy spells    ?Breathing is OK   NO CP     ? ?Diet: ?AM   Herbal life shake     ?Lunch   Mexican   Chicken      Beans  Rice   ?Dinner   Skip      Kl[place]ile ? ?Outpatient Medications Prior to Visit  ?Medication Sig Dispense Refill  ? albuterol (VENTOLIN HFA) 108 (90 Base) MCG/ACT inhaler ProAir HFA 90 mcg/actuation aerosol inhaler ? inhale 2 puffs by mouth every 4 hours if needed for cough or whee...  (REFER TO PRESCRIPTION NOTES).    ? amoxicillin (AMOXIL) 500 MG capsule Take 500 mg by mouth as needed (for dental visits).     ? carvedilol (COREG) 6.25 MG tablet TAKE 2 TABLETS BY MOUTH EVERY 12 HOURS 120 tablet 7  ? Diclofenac Sodium (PENNSAID) 2 % SOLN Pennsaid 20 mg/gram/actuation (2 %) topical soln in metered-dose pump ? apply 2 pumps twice daily    ? EPINEPHrine 0.3 mg/0.3 mL IJ SOAJ injection EpiPen 2-Pak 0.3 mg/0.3 mL injection, auto-injector    ? furosemide (LASIX) 40 MG tablet Take 1 tablet (40 mg total) by mouth 2 (two) times daily. 180 tablet 3  ? hydrALAZINE (APRESOLINE) 25 MG tablet Take 1 tablet (25 mg total) by mouth 2 (two) times daily. 180  tablet 1  ? ipratropium (ATROVENT) 0.03 % nasal spray ipratropium bromide 21 mcg (0.03 %) nasal spray    ? isosorbide mononitrate (IMDUR) 30 MG 24 hr tablet Take 1 tablet (30 mg total) by mouth daily. 90 tablet 3  ? loratadine (CLARITIN) 10 MG tablet Take 10 mg by mouth daily.    ? NASONEX 50 MCG/ACT nasal spray Place 2 sprays into both nostrils as needed.   0  ? OZEMPIC, 0.25 OR 0.5 MG/DOSE, 2 MG/1.5ML SOPN Inject into the skin once a week.    ? potassium chloride SA (KLOR-CON) 20 MEQ tablet TAKE 2 TABLETS BY MOUTH 2 TIMES A DAY 120 tablet 6  ? TURMERIC PO Take by mouth 2 (two) times daily.    ? UNABLE TO FIND Inhale 2 sprays into the lungs as needed. qvar inhaler    ? Vitamin D, Ergocalciferol, (DRISDOL) 1.25 MG (50000 UNIT) CAPS capsule Take 1 capsule (50,000 Units total) by mouth every  7 (seven) days. 4 capsule 0  ? amoxicillin-clavulanate (AUGMENTIN) 875-125 MG tablet Take 1 tablet by mouth 2 (two) times daily.    ? HYDROcodone-acetaminophen (NORCO/VICODIN) 5-325 MG tablet Take 1 tablet by mouth every 4 (four) hours as needed.    ? penicillin v potassium (VEETID) 500 MG tablet Take 500 mg by mouth 4 (four) times daily.    ? predniSONE (DELTASONE) 50 MG tablet Take one tablet daily for 5 days. 5 tablet 0  ? VITAMIN D PO Take 1 tablet by mouth daily.    ? ?No facility-administered medications prior to visit.  ? ? ? ?Allergies:   Peanut-containing drug products, Shellfish allergy, Vasotec [enalapril], and Iodine  ? ?Past Medical History:  ?Diagnosis Date  ? Allergy   ? Asthma   ? right knee   ? CHF (congestive heart failure) ([place])   ? cardiomyopathy  ? Diverticulosis   ? Family history of malignant neoplasm of gastrointestinal tract 02/17/2012  ? Father with colon cancer age 41   ? GASTROESOPHAGEAL REFLUX DISEASE 02/22/2009  ? Qualifier: Diagnosis of  By: Ronnald Ramp, CNA, Deborah    ? GERD (gastroesophageal reflux disease)   ? pt states doesnt have recently   ? Hypertension   ? Nonischemic cardiomyopathy ([place])   ? LHC  01/2009: Normal coronary arteries, EF 15%. Last echo 09/2010: EF 69-67%, grade 2 diastolic dysfunction, trivial MR, mild LAE.8/14 EF 40-45%   ? Obesity (BMI 30-39.9) 12/07/2015  ? OSA (obstructive sleep apnea) 06/12/2015  ? Mild OSA with total AHI 8/hr and in REM sleep 26/hr with oxygen desaturations as low as 70% with respiratory events.    ? OSA (obstructive sleep apnea)   ? use CPAP nightly  ? Seasonal allergies   ? Sleep apnea   ? wears cpap  ? ? ?Past Surgical History:  ?Procedure Laterality Date  ? CESAREAN SECTION  07/11/1991  ? COLONOSCOPY    ? POLYSOMNOGRAPHY 4 OR MORE PARAMETERS  08/03/2015  ? TUBAL LIGATION    ? S/P  ? ? ? ?Social History:  The patient  reports that she quit smoking about 33 years ago. Her smoking use included cigarettes. She has never used smokeless tobacco. She reports current alcohol use. She reports that she does not use drugs.  ? ?Family History:  The patient's family history includes Alcoholism in her father; Cancer in her father, mother, and paternal grandmother; Colon cancer (age of onset: 63) in her father; Coronary artery disease in her father; Diabetes in her maternal grandmother and mother; Hypertension in her father, mother, and another family member; Mental retardation in her son; Obesity in her mother.  ? ? ?ROS:  Please see the history of present illness. All other systems are reviewed and  Negative to the above problem except as noted.  ? ? ?PHYSICAL EXAM: ?VS:  BP 112/75   Pulse 89   Ht '5\' 3"'$  (1.6 m)   Wt 221 lb (100.2 kg)   SpO2 98%   BMI 39.15 kg/m?   ?GEN: Obese 56 yo , in no acute distress  ?HEENT: normal  ?Neck: JVP is not elev   No , carotid bruits  ?Cardiac: RRR; no murmurs, rubs  No LE edema  ?Respiratory:  clear to auscultation bilaterally, ?GI: soft, nontender, nondistended, + BS  No hepatomegaly  ?MS: no deformity Moving all extremities   ?Skin: warm and dry, no rash ?Neuro:  Strength and sensation are intact ?Psych: euthymic mood, full affect ? ? ?EKG:  EKG is  not ordered today  ?Lipid Panel ?   ?Component Value Date/Time  ? CHOL 180 10/26/2020 0000  ? CHOL 207 (H) 10/13/2019 1209  ? TRIG 86 10/26/2020 0000  ? HDL 50 10/26/2020 0000  ? HDL 60 10/13/2019 1209  ? CHOLHDL 3.6 10/26/2020 0000  ? VLDL 12 03/13/2015 0915  ? LDLCALC 111 (H) 10/26/2020 0000  ? LDLDIRECT 147.2 03/04/2013 1004  ? ?  ? ?Wt Readings from Last 3 Encounters:  ?06/19/21 221 lb (100.2 kg)  ?04/23/21 219 lb 12.8 oz (99.7 kg)  ?01/09/21 219 lb 12.8 oz (99.7 kg)  ?  ? ? ?ASSESSMENT AND PLAN: ? ?1  Chronic systolic CHF Last echo in 2019  LVEF 40 to 45%    VOlume status is OK   Cannot take ACEi or ARB    ?Continue current meds        Labs today    ? ?2  HTN  BP is controlled     ? ?3  Obesity   Discussed diet   Limit carbs    Time restricted edating    3 ? ?F/U in Dec  ? ?Labs:  CBC, BEMT, A1C  TSH, Lipomed , ApoB, Lpa   Vit D ?Signed, ?Dorris Carnes, MD  ?06/19/2021 9:07 AM    ?[facility] ?[address], [place], [place]  [address] ?Phone: [phone]; Fax: [phone]  ? ? ?

## 2021-06-19 ENCOUNTER — Ambulatory Visit: Payer: BC Managed Care – PPO | Admitting: Internal Medicine

## 2021-06-19 ENCOUNTER — Encounter: Payer: Self-pay | Admitting: Internal Medicine

## 2021-06-19 ENCOUNTER — Other Ambulatory Visit: Payer: Self-pay

## 2021-06-19 VITALS — BP 112/75 | HR 89 | Ht 63.0 in | Wt 221.0 lb

## 2021-06-19 DIAGNOSIS — I5022 Chronic systolic (congestive) heart failure: Secondary | ICD-10-CM

## 2021-06-19 DIAGNOSIS — I428 Other cardiomyopathies: Secondary | ICD-10-CM

## 2021-06-19 DIAGNOSIS — E669 Obesity, unspecified: Secondary | ICD-10-CM

## 2021-06-19 DIAGNOSIS — I10 Essential (primary) hypertension: Secondary | ICD-10-CM

## 2021-06-19 DIAGNOSIS — Z79899 Other long term (current) drug therapy: Secondary | ICD-10-CM

## 2021-06-19 NOTE — Patient Instructions (Signed)
Medication Instructions:  ?Your physician recommends that you continue on your current medications as directed. Please refer to the Current Medication list given to you today. ? ?*If you need a refill on your cardiac medications before your next appointment, please call your pharmacy* ? ? ?Lab Work: ?LIPOMED, BMET, CBC, VIT D, HGBA1C ?If you have labs (blood work) drawn today and your tests are completely normal, you will receive your results only by: ?MyChart Message (if you have MyChart) OR ?A paper copy in the mail ?If you have any lab test that is abnormal or we need to change your treatment, we will call you to review the results. ? ? ?Testing/Procedures: ?NONE ? ? ?Follow-Up: ?At [facility], you and your health needs are our priority.  As part of our continuing mission to provide you with exceptional heart care, we have created designated Provider Care Teams.  These Care Teams include your primary Cardiologist (physician) and Advanced Practice Providers (APPs -  Physician Assistants and Nurse Practitioners) who all work together to provide you with the care you need, when you need it. ? ?We recommend signing up for the patient portal called "MyChart".  Sign up information is provided on this After Visit Summary.  MyChart is used to connect with patients for Virtual Visits (Telemedicine).  Patients are able to view lab/test results, encounter notes, upcoming appointments, etc.  Non-urgent messages can be sent to your provider as well.   ?To learn more about what you can do with MyChart, go to [email or web address].   ? ?Your next appointment:   ?1 year(s) ? ?The format for your next appointment:   ?In Person ? ?Provider:   ?Dorris Carnes, MD   ? ? ?Other Instructions ?  ?

## 2021-06-20 LAB — CBC
Hematocrit: 43.3 % (ref 34.0–46.6)
Hemoglobin: 13.5 g/dL (ref 11.1–15.9)
MCH: 25.7 pg — ABNORMAL LOW (ref 26.6–33.0)
MCHC: 31.2 g/dL — ABNORMAL LOW (ref 31.5–35.7)
MCV: 82 fL (ref 79–97)
Platelets: 261 10*3/uL (ref 150–450)
RBC: 5.26 x10E6/uL (ref 3.77–5.28)
RDW: 13.8 % (ref 11.7–15.4)
WBC: 8.2 10*3/uL (ref 3.4–10.8)

## 2021-06-20 LAB — BASIC METABOLIC PANEL
BUN/Creatinine Ratio: 14 (ref 9–23)
BUN: 10 mg/dL (ref 6–24)
CO2: 24 mmol/L (ref 20–29)
Calcium: 9.1 mg/dL (ref 8.7–10.2)
Chloride: 104 mmol/L (ref 96–106)
Creatinine, Ser: 0.7 mg/dL (ref 0.57–1.00)
Glucose: 95 mg/dL (ref 70–99)
Potassium: 4.1 mmol/L (ref 3.5–5.2)
Sodium: 142 mmol/L (ref 134–144)
eGFR: 101 mL/min/{1.73_m2} (ref >59)

## 2021-06-20 LAB — NMR, LIPOPROFILE
Cholesterol, Total: 192 mg/dL (ref 100–199)
HDL Particle Number: 30 umol/L — ABNORMAL LOW (ref >=30.5)
HDL-C: 57 mg/dL (ref >39)
LDL Particle Number: 1575 nmol/L — ABNORMAL HIGH (ref <1000)
LDL Size: 20.7 nm (ref >20.5)
LDL-C (NIH Calc): 120 mg/dL — ABNORMAL HIGH (ref 0–99)
LP-IR Score: 39 (ref <=45)
Small LDL Particle Number: 875 nmol/L — ABNORMAL HIGH (ref <=527)
Triglycerides: 80 mg/dL (ref 0–149)

## 2021-06-20 LAB — HEMOGLOBIN A1C
Est. average glucose Bld gHb Est-mCnc: 128 mg/dL
Hgb A1c MFr Bld: 6.1 % — ABNORMAL HIGH (ref 4.8–5.6)

## 2021-06-20 LAB — VITAMIN D 25 HYDROXY (VIT D DEFICIENCY, FRACTURES): Vit D, 25-Hydroxy: 34.6 ng/mL (ref 30.0–100.0)

## 2021-06-20 LAB — APOLIPOPROTEIN B: Apolipoprotein B: 101 mg/dL — ABNORMAL HIGH (ref <90)

## 2021-06-20 LAB — LIPOPROTEIN A (LPA): Lipoprotein (a): 291.3 nmol/L — ABNORMAL HIGH (ref <75.0)

## 2021-06-24 ENCOUNTER — Telehealth: Payer: Self-pay

## 2021-06-24 DIAGNOSIS — Z79899 Other long term (current) drug therapy: Secondary | ICD-10-CM

## 2021-06-24 DIAGNOSIS — E785 Hyperlipidemia, unspecified: Secondary | ICD-10-CM

## 2021-06-24 MED ORDER — ROSUVASTATIN CALCIUM 20 MG PO TABS: 20.0000 mg | ORAL_TABLET | Freq: Every day | ORAL | 3 refills | Status: DC

## 2021-06-24 NOTE — Telephone Encounter (Signed)
Crestor sent to the pts Pharmacy.  ?

## 2021-06-24 NOTE — Telephone Encounter (Signed)
-----   Message from Fay Records, MD sent at 06/20/2021 10:06 PM EDT ----- ?Electrolytes and kidney function are OK ?Lpids are elevated, Lpa severely elevated and ApoB elevated ?I would recomm Crestor 20 mg     ?Follw up lipomed in 8 wks with liver panel ?Continue to work on diet     Low carbs, low sugars  ?

## 2021-06-27 ENCOUNTER — Encounter: Payer: Self-pay | Admitting: Internal Medicine

## 2021-08-01 NOTE — Telephone Encounter (Signed)
Pts voicemail is full...letter mailed to the pt re: her labs several weeks ago. ?

## 2021-08-30 ENCOUNTER — Ambulatory Visit (HOSPITAL_COMMUNITY)
Admission: EM | Admit: 2021-08-30 | Discharge: 2021-08-30 | Disposition: A | Discharge status: Home / Self Care | Payer: BC Managed Care – PPO | Attending: Emergency Medicine | Admitting: Emergency Medicine

## 2021-08-30 ENCOUNTER — Encounter (HOSPITAL_COMMUNITY): Payer: Self-pay

## 2021-08-30 DIAGNOSIS — S61212A Laceration without foreign body of right middle finger without damage to nail, initial encounter: Secondary | ICD-10-CM

## 2021-08-30 MED ORDER — CEPHALEXIN 500 MG PO CAPS: 500.0000 mg | ORAL_CAPSULE | Freq: Two times a day (BID) | ORAL | 0 refills | Status: AC

## 2021-08-30 NOTE — Discharge Instructions (Signed)
As your laceration initially occurred 5 days ago we will not be able to close it any further today, on exam your body has already closed the area well  Since you are having swelling and tenderness throughout the finger we will begin use of antibiotics to clear any possible that.  Take Keflex twice a day for the next 5 days  You may use Tylenol or ibuprofen as needed for comfort  You may place ice or heat in 10 to 15-minute intervals to further reduce swelling  You have been placed in a finger splint for stability, use and intervals as your finger is not broken you will do not want it to become weak and  If after finger has completely healed and you are still having limitations with movement you will need to follow-up with your orthopedic doctor  Follow-up with your primary doctor or the urgent care as needed for reevaluation

## 2021-08-30 NOTE — ED Triage Notes (Signed)
Pt states cut her rt middle finger with a knife 5 days ago, cleaned it and glued it up. States hit it today and it reopened.

## 2021-08-30 NOTE — ED Provider Notes (Signed)
[facility]    CSN: [id number] Arrival date & time: 08/30/21  1357      History   Chief Complaint Chief Complaint  Patient presents with   Laceration    HPI Katrina Carrillo is a 56 y.o. female.   Patient presents with laceration right middle finger occurring 5 days ago.  Offending agent is a knife.  Endorses that she was able to achieve hemostasis using direct pressure and creating a tourniquet, cleansed the area and has been wearing a colloid dressing constantly.  Endorses that she hit finger against the area today causing it to reopen, bleeding subsided while in the waiting room.  Has become swollen and is tender throughout the finger.  Limited range of motion, unable to obtain.   Past Medical History:  Diagnosis Date   Allergy    Asthma    right knee    CHF (congestive heart failure) ([facility])    cardiomyopathy   Diverticulosis    Family history of malignant neoplasm of gastrointestinal tract 02/17/2012   Father with colon cancer age 56    [place] DISEASE 02/22/2009   Qualifier: Diagnosis of  By: Ronnald Ramp, CNA, Deborah     GERD (gastroesophageal reflux disease)    pt states doesnt have recently    Hypertension    Nonischemic cardiomyopathy ([place])    [place] 01/2009: Normal coronary arteries, EF 15%. Last echo 09/2010: EF 56-25%, grade 2 diastolic dysfunction, trivial MR, mild LAE.8/14 EF 40-45%    Obesity (BMI 30-39.9) 12/07/2015   OSA (obstructive sleep apnea) 06/12/2015   Mild OSA with total AHI 8/hr and in REM sleep 26/hr with oxygen desaturations as low as 70% with respiratory events.     OSA (obstructive sleep apnea)    use CPAP nightly   Seasonal allergies    Sleep apnea    wears cpap    Patient Active Problem List   Diagnosis Date Noted   Menopause present 01/20/2018   SI (sacroiliac) joint dysfunction 05/06/2017   Lumbar radiculopathy 05/06/2017   Obesity (BMI 30-39.9) 12/07/2015   OSA (obstructive sleep apnea) 06/12/2015   Persistent  asthma 01/23/2015   Allergic rhinoconjunctivitis, seasonal and perennial 01/29/2014   Nonischemic cardiomyopathy ([place]) 08/12/2012   THYROMEGALY 63/89/3734   CHRONIC SYSTOLIC HEART FAILURE 28/76/8115   Essential hypertension 02/22/2009   LIVER HEMANGIOMA 02/15/2009    Past Surgical History:  Procedure Laterality Date   CESAREAN SECTION  07/11/1991   COLONOSCOPY     POLYSOMNOGRAPHY 4 OR MORE PARAMETERS  08/03/2015   TUBAL LIGATION     S/P    OB History     Gravida  1   Para  1   Term      Preterm      AB      Living         SAB      IAB      Ectopic      Multiple      Live Births               Home Medications    Prior to Admission medications   Medication Sig Start Date End Date Taking? Authorizing Provider  cephALEXin (KEFLEX) 500 MG capsule Take 1 capsule (500 mg total) by mouth 2 (two) times daily for 5 days. 08/30/21 09/04/21 Yes Kiam Bransfield R, NP  albuterol (VENTOLIN HFA) 108 (90 Base) MCG/ACT inhaler ProAir HFA 90 mcg/actuation aerosol inhaler  inhale 2 puffs by mouth every 4 hours if  needed for cough or whee...  (REFER TO PRESCRIPTION NOTES).    [provider]  carvedilol (COREG) 6.25 MG tablet TAKE 2 TABLETS BY MOUTH EVERY 12 HOURS 05/17/19   Fay Records, MD  Diclofenac Sodium (PENNSAID) 2 % SOLN Pennsaid 20 mg/gram/actuation (2 %) topical soln in metered-dose pump  apply 2 pumps twice daily    [provider]  EPINEPHrine 0.3 mg/0.3 mL IJ SOAJ injection EpiPen 2-Pak 0.3 mg/0.3 mL injection, auto-injector    [provider]  furosemide (LASIX) 40 MG tablet Take 1 tablet (40 mg total) by mouth 2 (two) times daily. 08/20/18   Bhagat, Crista Luria, PA  hydrALAZINE (APRESOLINE) 25 MG tablet Take 1 tablet (25 mg total) by mouth 2 (two) times daily. 06/04/21   Fay Records, MD  ipratropium (ATROVENT) 0.03 % nasal spray ipratropium bromide 21 mcg (0.03 %) nasal spray    [provider]  isosorbide mononitrate (IMDUR) 30  MG 24 hr tablet Take 1 tablet (30 mg total) by mouth daily. 08/20/18   Bhagat, Crista Luria, PA  loratadine (CLARITIN) 10 MG tablet Take 10 mg by mouth daily.    [provider]  NASONEX 50 MCG/ACT nasal spray Place 2 sprays into both nostrils as needed.  03/14/14   [provider]  OZEMPIC, 0.25 OR 0.5 MG/DOSE, 2 MG/1.5ML SOPN Inject into the skin once a week. 06/02/21   [provider]  potassium chloride SA (KLOR-CON) 20 MEQ tablet TAKE 2 TABLETS BY MOUTH 2 TIMES A DAY 05/17/19   Bhagat, Bhavinkumar, PA  rosuvastatin (CRESTOR) 20 MG tablet Take 1 tablet (20 mg total) by mouth daily. 06/24/21   Fay Records, MD  TURMERIC PO Take by mouth 2 (two) times daily.    [provider]  UNABLE TO FIND Inhale 2 sprays into the lungs as needed. qvar inhaler    [provider]  Vitamin D, Ergocalciferol, (DRISDOL) 1.25 MG (50000 UNIT) CAPS capsule Take 1 capsule (50,000 Units total) by mouth every 7 (seven) days. 01/10/20   Mellody Dance, DO    Family History Family History  Problem Relation Age of Onset   Coronary artery disease Father    Colon cancer Father 5   Cancer Father    Hypertension Father    Alcoholism Father    Diabetes Mother    Cancer Mother        vulvular cancer   Hypertension Mother    Obesity Mother    Diabetes Maternal Grandmother    Cancer Paternal Grandmother    Mental retardation Son        TBI during birth   Hypertension Other    Stomach cancer Neg Hx    Colon polyps Neg Hx    Rectal cancer Neg Hx     Social History Social History   Tobacco Use   Smoking status: Former    Years: 2.00    Types: Cigarettes    Quit date: 04/01/1988    Years since quitting: 33.4   Smokeless tobacco: Never  Vaping Use   Vaping Use: Never used  Substance Use Topics   Alcohol use: Yes    Comment: social   Drug use: No     Allergies   Peanut-containing drug products, Shellfish allergy, Vasotec [enalapril], and Iodine   Review of  Systems Review of Systems  Constitutional: Negative.   Respiratory: Negative.    Cardiovascular: Negative.   Skin:  Positive for wound. Negative for color change, pallor and rash.  Neurological:  Negative.     Physical Exam Triage Vital Signs ED Triage Vitals  Enc Vitals Group     BP 08/30/21 1518 118/76     Pulse Rate 08/30/21 1518 96     Resp 08/30/21 1518 18     Temp 08/30/21 1518 99 F (37.2 C)     Temp Source 08/30/21 1518 Oral     SpO2 08/30/21 1518 95 %     Weight --      Height --      Head Circumference --      Peak Flow --      Pain Score 08/30/21 1519 3     Pain Loc --      Pain Edu? --      Excl. in [place]? --    No data found.  Updated Vital Signs BP 118/76 (BP Location: Left Arm)   Pulse 96   Temp 99 F (37.2 C) (Oral)   Resp 18   SpO2 95%   Visual Acuity Right Eye Distance:   Left Eye Distance:   Bilateral Distance:    Right Eye Near:   Left Eye Near:    Bilateral Near:     Physical Exam Constitutional:      Appearance: Normal appearance.  HENT:     Head: Normocephalic.  Eyes:     Extraocular Movements: Extraocular movements intact.  Pulmonary:     Effort: Pulmonary effort is normal.  Musculoskeletal:     Comments: 1 and half by 0.5 cm laceration present to the distal phalanx of the right middle finger on the palmar aspect, bleeding has subsided and skin is currently intact, no involvement of the DIP joint or Nailbed, sensation is intact, capillary refill less than 3, mild to moderate swelling and tenderness at the distal phalanx without erythema, limited range of motion, unable to actively flex finger, able to extend  Neurological:     Mental Status: She is alert and oriented to person, place, and time. Mental status is at baseline.  Psychiatric:        Mood and Affect: Mood normal.        Behavior: Behavior normal.     UC Treatments / Results  Labs (all labs ordered are listed, but only abnormal results are displayed) Labs Reviewed -  No data to display  EKG   Radiology No results found.  Procedures Procedures (including critical care time)  Medications Ordered in UC Medications - No data to display  Initial Impression / Assessment and Plan / UC Course  I have reviewed the triage vital signs and the nursing notes.  Pertinent labs & imaging results that were available during my care of the patient were reviewed by me and considered in my medical decision making (see chart for details).  Laceration right middle finger without foreign body without damage to nail, initial encounter  As laceration occurred 5 days ago will not attempt to reclose today healing process with skin currently being intact, swelling and tenderness is present to the distal phalanx, will begin coverage for infection, Keflex 5-day course prescribed, discussed administration, use Tylenol, ibuprofen, ice and heat for comfort, finger splint applied by nursing staff for stability, advised patient to use splint and intervals as her finger is not broken, and if after wound has healed she continues to have limited range of motion she will need to follow-up with her orthopedic specialist, for any concerns about healing of the wound.  She may follow-up with urgent care or primary  doctor Final Clinical Impressions(s) / UC Diagnoses   Final diagnoses:  Laceration of right middle finger without foreign body without damage to nail, initial encounter     Discharge Instructions      As your laceration initially occurred 5 days ago we will not be able to close it any further today, on exam your body has already closed the area well  Since you are having swelling and tenderness throughout the finger we will begin use of antibiotics to clear any possible that.  Take Keflex twice a day for the next 5 days  You may use Tylenol or ibuprofen as needed for comfort  You may place ice or heat in 10 to 15-minute intervals to further reduce swelling  You have been  placed in a finger splint for stability, use and intervals as your finger is not broken you will do not want it to become weak and  If after finger has completely healed and you are still having limitations with movement you will need to follow-up with your orthopedic doctor  Follow-up with your primary doctor or the urgent care as needed for reevaluation   ED Prescriptions     Medication Sig Dispense Auth. Provider   cephALEXin (KEFLEX) 500 MG capsule Take 1 capsule (500 mg total) by mouth 2 (two) times daily for 5 days. 10 capsule Hans Eden, NP      PDMP not reviewed this encounter.   Hans Eden, [place] 08/30/21 [phone]

## 2021-11-02 ENCOUNTER — Other Ambulatory Visit: Payer: Self-pay | Admitting: Internal Medicine

## 2021-11-03 LAB — LIPID PANEL
Cholesterol: 164 mg/dL (ref <200)
HDL: 59 mg/dL (ref >=50)
LDL Cholesterol (Calc): 88 mg/dL (calc)
Non-HDL Cholesterol (Calc): 105 mg/dL (calc) (ref <130)
Total CHOL/HDL Ratio: 2.8 (calc) (ref <5.0)
Triglycerides: 81 mg/dL (ref <150)

## 2021-11-03 LAB — CBC
HCT: 41.4 % (ref 35.0–45.0)
Hemoglobin: 13.3 g/dL (ref 11.7–15.5)
MCH: 26.1 pg — ABNORMAL LOW (ref 27.0–33.0)
MCHC: 32.1 g/dL (ref 32.0–36.0)
MCV: 81.3 fL (ref 80.0–100.0)
MPV: 10.6 fL (ref 7.5–12.5)
Platelets: 274 10*3/uL (ref 140–400)
RBC: 5.09 10*6/uL (ref 3.80–5.10)
RDW: 14.3 % (ref 11.0–15.0)
WBC: 9.5 10*3/uL (ref 3.8–10.8)

## 2021-11-03 LAB — COMPLETE METABOLIC PANEL WITH GFR
AG Ratio: 1.1 (calc) (ref 1.0–2.5)
ALT: 24 U/L (ref 6–29)
AST: 23 U/L (ref 10–35)
Albumin: 4.1 g/dL (ref 3.6–5.1)
Alkaline phosphatase (APISO): 66 U/L (ref 37–153)
BUN: 12 mg/dL (ref 7–25)
CO2: 27 mmol/L (ref 20–32)
Calcium: 8.9 mg/dL (ref 8.6–10.4)
Chloride: 103 mmol/L (ref 98–110)
Creat: 0.7 mg/dL (ref 0.50–1.03)
Globulin: 3.6 g/dL (calc) (ref 1.9–3.7)
Glucose, Bld: 95 mg/dL (ref 65–99)
Potassium: 4.2 mmol/L (ref 3.5–5.3)
Sodium: 139 mmol/L (ref 135–146)
Total Bilirubin: 0.3 mg/dL (ref 0.2–1.2)
Total Protein: 7.7 g/dL (ref 6.1–8.1)
eGFR: 101 mL/min/{1.73_m2} (ref >=60)

## 2021-11-03 LAB — VITAMIN D 25 HYDROXY (VIT D DEFICIENCY, FRACTURES): Vit D, 25-Hydroxy: 71 ng/mL (ref 30–100)

## 2021-11-03 LAB — TSH: TSH: 0.52 mIU/L (ref 0.40–4.50)

## 2021-11-07 ENCOUNTER — Encounter (INDEPENDENT_AMBULATORY_CARE_PROVIDER_SITE_OTHER): Payer: Self-pay

## 2022-02-22 ENCOUNTER — Other Ambulatory Visit: Payer: Self-pay | Admitting: Internal Medicine

## 2022-02-26 ENCOUNTER — Telehealth: Payer: Self-pay | Admitting: Internal Medicine

## 2022-02-26 NOTE — Telephone Encounter (Signed)
Pt would like a callback from nurse regarding what she thinks to be circulation issues. Please advise

## 2022-02-26 NOTE — Telephone Encounter (Signed)
Called patient back to let her know Dr. Harrington Challenger' recommendations. Patient verbalized understanding.

## 2022-02-26 NOTE — Telephone Encounter (Signed)
Agree with recommendations. I do not think finger swelling due to rosuvastatin  WOuld contact PCP to evaluate Will not hurt to hold it

## 2022-02-26 NOTE — Telephone Encounter (Signed)
Called patient about her message. Patient complaining about her left index finger being swollen and tight since last Tuesday. Patient also complained of hives on arms that have gone away. Patient stated the swelling on her finger has gone down some, but is concerned that it might be her rosuvastatin. Encouraged her that she should call her PCP about her finger, to make sure she does not have anything like cellulitis. Informed patient that she could hold her rosuvastatin to see if that helps. Will send message to Dr. Harrington Challenger and her nurse for any further advisement.

## 2022-05-28 ENCOUNTER — Other Ambulatory Visit: Payer: Self-pay | Admitting: Internal Medicine

## 2022-05-28 DIAGNOSIS — Z1231 Encounter for screening mammogram for malignant neoplasm of breast: Secondary | ICD-10-CM

## 2022-05-29 ENCOUNTER — Ambulatory Visit (INDEPENDENT_AMBULATORY_CARE_PROVIDER_SITE_OTHER): Payer: BC Managed Care – PPO

## 2022-05-29 ENCOUNTER — Ambulatory Visit: Payer: BC Managed Care – PPO | Admitting: Podiatry

## 2022-05-29 DIAGNOSIS — M7661 Achilles tendinitis, right leg: Secondary | ICD-10-CM

## 2022-05-29 DIAGNOSIS — M62461 Contracture of muscle, right lower leg: Secondary | ICD-10-CM | POA: Diagnosis not present

## 2022-05-29 DIAGNOSIS — M9261 Juvenile osteochondrosis of tarsus, right ankle: Secondary | ICD-10-CM | POA: Diagnosis not present

## 2022-05-29 MED ORDER — DICLOFENAC SODIUM 1 % EX GEL: 4.0000 g | Freq: Four times a day (QID) | CUTANEOUS | 4 refills | Status: DC

## 2022-05-29 NOTE — Patient Instructions (Signed)

## 2022-05-29 NOTE — Progress Notes (Signed)
  Subjective:  Patient ID: Katrina Carrillo, female    DOB: [birth year],  MRN: [id number]  Chief Complaint  Patient presents with   Tendonitis    NP last seen in 2021, R heel pain, achilles heel tendionitis    57 y.o. female presents with the above complaint. History confirmed with patient.  She has had treatment for this before including injections, had some therapy last year which helped.  It continues to wax and wane never fully went away  Objective:  Physical Exam: warm, good capillary refill, no trophic changes or ulcerative lesions, normal DP and PT pulses, normal sensory exam  Right Foot: tenderness at Achilles tendon insertion and gastrocnemius equinus is noted with a positive silverskiold test  No images are attached to the encounter.  Radiographs: Multiple views x-ray of the right foot: no fracture, dislocation, swelling or degenerative changes noted, plantar calcaneal spur, posterior calcaneal spur, and Haglund deformity noted Assessment:   1. Tendonitis, Achilles, right   2. Gastrocnemius equinus of right lower extremity   3. Haglund's deformity of right heel      Plan:  Patient was evaluated and treated and all questions answered.  Discussed the etiology and treatment options for Achilles tendinitis including stretching, formal physical therapy with an eccentric exercises therapy plan, supportive shoegears such as a running shoe or sneaker, heel lifts, topical and oral medications.  We also discussed that I do not routinely perform injections in this area because of the risk of an increased damage or rupture of the tendon.  We also discussed the role of surgical treatment of this for patients who do not improve after exhausting non-surgical treatment options.  -XR reviewed with patient -Educated on stretching and icing of the affected limb. -I recommended diclofenac gel and sent this to her pharmacy use 3-4 times daily. -She would prefer to avoid oral medications.   She has taken turmeric before and this does help with her inflammation.  I recommended she continue this -We also discussed the options of EPAT or PRP injection therapy.  We discussed the risk and benefits.  She will consider this and if not improving may proceed with this   Return in about 6 weeks (around 07/10/2022) for re-check Achilles tendon.

## 2022-05-30 ENCOUNTER — Ambulatory Visit
Admission: RE | Admit: 2022-05-30 | Discharge: 2022-05-30 | Disposition: A | Discharge status: Home / Self Care | Payer: BC Managed Care – PPO | Source: Ambulatory Visit | Attending: Internal Medicine | Admitting: Internal Medicine

## 2022-05-30 DIAGNOSIS — Z1231 Encounter for screening mammogram for malignant neoplasm of breast: Secondary | ICD-10-CM

## 2022-06-23 NOTE — Progress Notes (Unsigned)
Cardiology Office Note   Date:  06/24/2022   ID:  Katrina Carrillo, Katrina Carrillo 01/26/1966, MRN [id number]  PCP:  Nolene Ebbs, MD  Cardiologist:   Dorris Carnes, MD   F/U of mild LV dysfunction and HTN      History of Present Illness: Katrina Carrillo is a 57 y.o. female with a history of NICM, HTN.  2010  LVEF 15%  Cath showed Normal coronary arteries  Last echo in Oct 2019 40 to 45%  Patient  also has history of HTN   In 2020 the pt  went to ED with swelling, angioedemia      ACE inhibitor stopped    I last saw the pt in clniic in March 2023   Since seen she has done OK  Denies SOB  NO CP   No dizziness    Complains of   Weight   Cut out potatoes,  Limiting starch   Trying to lose wt.     Rx for Ozempic to [facility] 2  Numbness R thumb after sleeping  3  Rash   Stopped Crestor  Got better   Off of Crestor still    Trainer died   Trying to stay active   Outpatient Medications Prior to Visit  Medication Sig Dispense Refill   albuterol (VENTOLIN HFA) 108 (90 Base) MCG/ACT inhaler ProAir HFA 90 mcg/actuation aerosol inhaler  inhale 2 puffs by mouth every 4 hours if needed for cough or whee...  (REFER TO PRESCRIPTION NOTES).     carvedilol (COREG) 6.25 MG tablet TAKE 2 TABLETS BY MOUTH EVERY 12 HOURS 120 tablet 7   diclofenac Sodium (VOLTAREN) 1 % GEL Apply 4 g topically 4 (four) times daily. 100 g 4   EPINEPHrine 0.3 mg/0.3 mL IJ SOAJ injection EpiPen 2-Pak 0.3 mg/0.3 mL injection, auto-injector     furosemide (LASIX) 40 MG tablet Take 1 tablet (40 mg total) by mouth 2 (two) times daily. 180 tablet 3   hydrALAZINE (APRESOLINE) 25 MG tablet TAKE 1 TABLET(25 MG) BY MOUTH TWICE DAILY 180 tablet 1   ipratropium (ATROVENT) 0.03 % nasal spray ipratropium bromide 21 mcg (0.03 %) nasal spray     isosorbide mononitrate (IMDUR) 30 MG 24 hr tablet Take 1 tablet (30 mg total) by mouth daily. 90 tablet 3   loratadine (CLARITIN) 10 MG tablet Take 10 mg by mouth daily.     NASONEX 50 MCG/ACT  nasal spray Place 2 sprays into both nostrils as needed.   0   potassium chloride SA (KLOR-CON) 20 MEQ tablet TAKE 2 TABLETS BY MOUTH 2 TIMES A DAY 120 tablet 6   TURMERIC PO Take by mouth 2 (two) times daily.     UNABLE TO FIND Inhale 2 sprays into the lungs as needed. qvar inhaler     Vitamin D, Ergocalciferol, (DRISDOL) 1.25 MG (50000 UNIT) CAPS capsule Take 1 capsule (50,000 Units total) by mouth every 7 (seven) days. 4 capsule 0   rosuvastatin (CRESTOR) 20 MG tablet Take 1 tablet (20 mg total) by mouth daily. (Patient not taking: Reported on 06/24/2022) 90 tablet 3   Diclofenac Sodium (PENNSAID) 2 % SOLN Pennsaid 20 mg/gram/actuation (2 %) topical soln in metered-dose pump  apply 2 pumps twice daily     OZEMPIC, 0.25 OR 0.5 MG/DOSE, 2 MG/1.5ML SOPN Inject into the skin once a week. (Patient not taking: Reported on 06/24/2022)     No facility-administered medications prior to visit.     Allergies:   Peanut-containing  drug products, Shellfish allergy, Vasotec [enalapril], and Iodine   Past Medical History:  Diagnosis Date   Allergy    Asthma    right knee    CHF (congestive heart failure) (HCC)    cardiomyopathy   Diverticulosis    Family history of malignant neoplasm of gastrointestinal tract 02/17/2012   Father with colon cancer age 34    [place] DISEASE 02/22/2009   Qualifier: Diagnosis of  By: Ronnald Ramp, CNA, Deborah     GERD (gastroesophageal reflux disease)    pt states doesnt have recently    Hypertension    Nonischemic cardiomyopathy ([facility])    [place] 01/2009: Normal coronary arteries, EF 15%. Last echo 09/2010: EF 123456, grade 2 diastolic dysfunction, trivial MR, mild LAE.8/14 EF 40-45%    Obesity (BMI 30-39.9) 12/07/2015   OSA (obstructive sleep apnea) 06/12/2015   Mild OSA with total AHI 8/hr and in REM sleep 26/hr with oxygen desaturations as low as 70% with respiratory events.     OSA (obstructive sleep apnea)    use CPAP nightly   Seasonal allergies    Sleep  apnea    wears cpap    Past Surgical History:  Procedure Laterality Date   CESAREAN SECTION  07/11/1991   COLONOSCOPY     POLYSOMNOGRAPHY 4 OR MORE PARAMETERS  08/03/2015   TUBAL LIGATION     S/P     Social History:  The patient  reports that she quit smoking about 34 years ago. Her smoking use included cigarettes. She has never used smokeless tobacco. She reports current alcohol use. She reports that she does not use drugs.   Family History:  The patient's family history includes Alcoholism in her father; Cancer in her father, mother, and paternal grandmother; Colon cancer (age of onset: 58) in her father; Coronary artery disease in her father; Diabetes in her maternal grandmother and mother; Hypertension in her father, mother, and another family member; Mental retardation in her son; Obesity in her mother.    ROS:  Please see the history of present illness. All other systems are reviewed and  Negative to the above problem except as noted.    PHYSICAL EXAM: VS:  BP 128/80   Pulse 78   Ht 5\' 3"  (1.6 m)   Wt 221 lb 4.8 oz (100.4 kg)   SpO2 97%   BMI 39.20 kg/m   GEN: Obese 57 yo , in no acute distress  HEENT: normal  Neck: JVP is normal    No carotid bruit Cardiac: RRR; no murmur   No LE edema  Respiratory:  clear to auscultation  GI: soft, nontender, nondistended, + BS  No hepatomegaly  MS: no deformity Moving all extremities   Skin: warm and dry, no rash    EKG:  EKG is ordered today   NSR 78 bpm   T wave inversion III, AVF   Lipid Panel    Component Value Date/Time   CHOL 164 11/02/2021 0000   CHOL 207 (H) 10/13/2019 1209   TRIG 81 11/02/2021 0000   HDL 59 11/02/2021 0000   HDL 60 10/13/2019 1209   CHOLHDL 2.8 11/02/2021 0000   VLDL 12 03/13/2015 0915   LDLCALC 88 11/02/2021 0000   LDLDIRECT 147.2 03/04/2013 1004      Wt Readings from Last 3 Encounters:  06/24/22 221 lb 4.8 oz (100.4 kg)  06/19/21 221 lb (100.2 kg)  04/23/21 219 lb 12.8 oz (99.7 kg)       ASSESSMENT AND PLAN:  1  Chronic systolic CHF Last echo in 2019  LVEF 40 to 45%    VOlume status is OK   Cannot take ACEi or ARB    BP at home better    110s/    Continue current regimen    2  HTN  BP is controlled when gets all meds   Put in for refills  3  HL  Off of Crestor  Stopped when she noticed a rash on body   She says it got better after stopping    Note on previous labs in 2023 her   Lpa  (291) and Apo B (101) were very high    Will get lipomed     4 Obesity   Get active after meals    Discussed veggies, limit carbs  Follow up in Jan 2025  Signed, Dorris Carnes, MD  06/24/2022 9:05 AM    [address], [place], [place]  [address] Phone: [phone]; Fax: [phone]

## 2022-06-24 ENCOUNTER — Ambulatory Visit: Payer: BC Managed Care – PPO | Attending: Internal Medicine | Admitting: Internal Medicine

## 2022-06-24 ENCOUNTER — Encounter: Payer: Self-pay | Admitting: Internal Medicine

## 2022-06-24 VITALS — BP 128/80 | HR 78 | Ht 63.0 in | Wt 221.3 lb

## 2022-06-24 DIAGNOSIS — Z79899 Other long term (current) drug therapy: Secondary | ICD-10-CM | POA: Diagnosis not present

## 2022-06-24 DIAGNOSIS — I428 Other cardiomyopathies: Secondary | ICD-10-CM | POA: Diagnosis not present

## 2022-06-24 DIAGNOSIS — E785 Hyperlipidemia, unspecified: Secondary | ICD-10-CM

## 2022-06-24 DIAGNOSIS — I5022 Chronic systolic (congestive) heart failure: Secondary | ICD-10-CM | POA: Diagnosis not present

## 2022-06-24 DIAGNOSIS — G4733 Obstructive sleep apnea (adult) (pediatric): Secondary | ICD-10-CM

## 2022-06-24 DIAGNOSIS — I10 Essential (primary) hypertension: Secondary | ICD-10-CM

## 2022-06-24 MED ORDER — CARVEDILOL 6.25 MG PO TABS: 12.5000 mg | ORAL_TABLET | Freq: Two times a day (BID) | ORAL | 3 refills | Status: DC

## 2022-06-24 MED ORDER — HYDRALAZINE HCL 25 MG PO TABS: ORAL_TABLET | ORAL | 1 refills | Status: DC

## 2022-06-24 NOTE — Patient Instructions (Signed)
Medication Instructions:   *If you need a refill on your cardiac medications before your next appointment, please call your pharmacy*   Lab Work: Bmet, cbc, nmr, apo b, lipo a, hgba1c, vit d  If you have labs (blood work) drawn today and your tests are completely normal, you will receive your results only by: [place] (if you have MyChart) OR A paper copy in the mail If you have any lab test that is abnormal or we need to change your treatment, we will call you to review the results.   Testing/Procedures:    Follow-Up: At [facility], you and your health needs are our priority.  As part of our continuing mission to provide you with exceptional heart care, we have created designated Provider Care Teams.  These Care Teams include your primary Cardiologist (physician) and Advanced Practice Providers (APPs -  Physician Assistants and Nurse Practitioners) who all work together to provide you with the care you need, when you need it.  We recommend signing up for the patient portal called "MyChart".  Sign up information is provided on this After Visit Summary.  MyChart is used to connect with patients for Virtual Visits (Telemedicine).  Patients are able to view lab/test results, encounter notes, upcoming appointments, etc.  Non-urgent messages can be sent to your provider as well.   To learn more about what you can do with MyChart, go to [email or web address].    Your next appointment:   10 month(s)  Provider:   Dorris Carnes, MD     Other Instructions

## 2022-06-25 LAB — NMR, LIPOPROFILE
Cholesterol, Total: 200 mg/dL — ABNORMAL HIGH (ref 100–199)
HDL Particle Number: 33 umol/L (ref >=30.5)
HDL-C: 70 mg/dL (ref >39)
LDL Particle Number: 1395 nmol/L — ABNORMAL HIGH (ref <1000)
LDL Size: 21 nm (ref >20.5)
LDL-C (NIH Calc): 120 mg/dL — ABNORMAL HIGH (ref 0–99)
LP-IR Score: <25 (ref <=45)
Small LDL Particle Number: 383 nmol/L (ref <=527)
Triglycerides: 54 mg/dL (ref 0–149)

## 2022-06-25 LAB — LIPOPROTEIN A (LPA): Lipoprotein (a): 272.1 nmol/L — ABNORMAL HIGH (ref <75.0)

## 2022-06-25 LAB — BASIC METABOLIC PANEL
BUN/Creatinine Ratio: 17 (ref 9–23)
BUN: 11 mg/dL (ref 6–24)
CO2: 26 mmol/L (ref 20–29)
Calcium: 8.9 mg/dL (ref 8.7–10.2)
Chloride: 99 mmol/L (ref 96–106)
Creatinine, Ser: 0.65 mg/dL (ref 0.57–1.00)
Glucose: 106 mg/dL — ABNORMAL HIGH (ref 70–99)
Potassium: 3.5 mmol/L (ref 3.5–5.2)
Sodium: 142 mmol/L (ref 134–144)
eGFR: 103 mL/min/{1.73_m2} (ref >59)

## 2022-06-25 LAB — CBC
Hematocrit: 43.7 % (ref 34.0–46.6)
Hemoglobin: 14.1 g/dL (ref 11.1–15.9)
MCH: 26.1 pg — ABNORMAL LOW (ref 26.6–33.0)
MCHC: 32.3 g/dL (ref 31.5–35.7)
MCV: 81 fL (ref 79–97)
Platelets: 253 10*3/uL (ref 150–450)
RBC: 5.41 x10E6/uL — ABNORMAL HIGH (ref 3.77–5.28)
RDW: 13.8 % (ref 11.7–15.4)
WBC: 9.4 10*3/uL (ref 3.4–10.8)

## 2022-06-25 LAB — APOLIPOPROTEIN B: Apolipoprotein B: 99 mg/dL — ABNORMAL HIGH (ref <90)

## 2022-06-25 LAB — HEMOGLOBIN A1C
Est. average glucose Bld gHb Est-mCnc: 131 mg/dL
Hgb A1c MFr Bld: 6.2 % — ABNORMAL HIGH (ref 4.8–5.6)

## 2022-06-25 LAB — VITAMIN D 25 HYDROXY (VIT D DEFICIENCY, FRACTURES): Vit D, 25-Hydroxy: 48 ng/mL (ref 30.0–100.0)

## 2022-07-02 ENCOUNTER — Other Ambulatory Visit: Payer: Self-pay

## 2022-07-02 DIAGNOSIS — Z79899 Other long term (current) drug therapy: Secondary | ICD-10-CM

## 2022-07-02 DIAGNOSIS — I10 Essential (primary) hypertension: Secondary | ICD-10-CM

## 2022-07-02 DIAGNOSIS — E785 Hyperlipidemia, unspecified: Secondary | ICD-10-CM

## 2022-07-02 MED ORDER — ATORVASTATIN CALCIUM 20 MG PO TABS: 20.0000 mg | ORAL_TABLET | Freq: Every day | ORAL | 3 refills | Status: AC

## 2022-07-10 ENCOUNTER — Ambulatory Visit: Payer: BC Managed Care – PPO | Admitting: Podiatry

## 2022-07-10 DIAGNOSIS — M7661 Achilles tendinitis, right leg: Secondary | ICD-10-CM

## 2022-07-10 DIAGNOSIS — M62461 Contracture of muscle, right lower leg: Secondary | ICD-10-CM

## 2022-07-10 MED ORDER — DICLOFENAC SODIUM 1 % EX GEL: 4.0000 g | Freq: Four times a day (QID) | CUTANEOUS | 4 refills | Status: DC

## 2022-07-10 NOTE — Patient Instructions (Signed)

## 2022-07-14 NOTE — Progress Notes (Signed)
  Subjective:  Patient ID: Katrina Carrillo, female    DOB: [birth year],  MRN: [id number]  Chief Complaint  Patient presents with   Tendonitis    6 week follow up  Achilles tendon right. Feels like the pressure is not as bad    57 y.o. female presents with the above complaint. History confirmed with patient.  She has had some improvement  Objective:  Physical Exam: warm, good capillary refill, no trophic changes or ulcerative lesions, normal DP and PT pulses, normal sensory exam  Right Foot: tenderness at Achilles tendon insertion and gastrocnemius equinus is noted with a positive silverskiold test  No images are attached to the encounter.  Radiographs: Multiple views x-ray of the right foot: no fracture, dislocation, swelling or degenerative changes noted, plantar calcaneal spur, posterior calcaneal spur, and Haglund deformity noted Assessment:   1. Tendonitis, Achilles, right   2. Gastrocnemius equinus of right lower extremity      Plan:  Patient was evaluated and treated and all questions answered.   She has had some improvement and I recommend she continue her home therapy plan and utilizing the topical Voltaren gel.  Return in 1 month for follow-up.  May consider MRI and/or EPAT or PRP at that point if not improving.     Return in about 1 month (around 08/09/2022) for re-check Achilles tendon.

## 2022-07-31 ENCOUNTER — Encounter: Payer: Self-pay | Admitting: Gastroenterology

## 2022-08-13 ENCOUNTER — Encounter: Payer: Self-pay | Admitting: Podiatry

## 2022-08-13 ENCOUNTER — Ambulatory Visit: Payer: BC Managed Care – PPO | Admitting: Podiatry

## 2022-08-13 DIAGNOSIS — M62461 Contracture of muscle, right lower leg: Secondary | ICD-10-CM | POA: Diagnosis not present

## 2022-08-13 DIAGNOSIS — M9261 Juvenile osteochondrosis of tarsus, right ankle: Secondary | ICD-10-CM

## 2022-08-13 DIAGNOSIS — M7661 Achilles tendinitis, right leg: Secondary | ICD-10-CM

## 2022-08-13 NOTE — Progress Notes (Signed)
  Subjective:  Patient ID: Katrina Carrillo, female    DOB: 05/13/1965,  MRN: [id number]  Chief Complaint  Patient presents with   Tendonitis    4 week follow up right Achilles - doing much better, in boot, using Voltaren gel and it seems to be helping     57 y.o. female presents with the above complaint. History confirmed with patient.  She again notes quite a bit of improvement, says about 60% better from when she initially started therapy and treatment, she has been utilizing the boot  Objective:  Physical Exam: warm, good capillary refill, no trophic changes or ulcerative lesions, normal DP and PT pulses, normal sensory exam  Right Foot: Not much pain at the direct insertion with palpation, good 5 out of 5 strength  No images are attached to the encounter.  Radiographs: Multiple views x-ray of the right foot: no fracture, dislocation, swelling or degenerative changes noted, plantar calcaneal spur, posterior calcaneal spur, and Haglund deformity noted Assessment:   1. Tendonitis, Achilles, right   2. Gastrocnemius equinus of right lower extremity   3. Haglund's deformity of right heel      Plan:  Patient was evaluated and treated and all questions answered.  Doing much better and continues to improve.  May gradually wean out of her cam boot and back to regular shoe gear with felt heel lifts which I dispensed.  Continue utilizing Voltaren gel.  Continue home therapy plan.  If not improving or worsens at any we will consider MRI and/or PRP/EPAT     Return in about 2 months (around 10/13/2022) for re-check Achilles tendon.

## 2022-09-03 ENCOUNTER — Ambulatory Visit: Payer: BC Managed Care – PPO | Attending: Interventional Cardiology

## 2022-09-03 DIAGNOSIS — Z79899 Other long term (current) drug therapy: Secondary | ICD-10-CM

## 2022-09-03 DIAGNOSIS — E785 Hyperlipidemia, unspecified: Secondary | ICD-10-CM

## 2022-09-03 DIAGNOSIS — I10 Essential (primary) hypertension: Secondary | ICD-10-CM

## 2022-09-04 LAB — HEPATIC FUNCTION PANEL
ALT: 25 IU/L (ref 0–32)
AST: 20 IU/L (ref 0–40)
Albumin: 4 g/dL (ref 3.8–4.9)
Alkaline Phosphatase: 66 IU/L (ref 44–121)
Bilirubin Total: 0.2 mg/dL (ref 0.0–1.2)
Bilirubin, Direct: <0.1 mg/dL (ref 0.00–0.40)
Total Protein: 7.2 g/dL (ref 6.0–8.5)

## 2022-09-04 LAB — NMR, LIPOPROFILE
Cholesterol, Total: 145 mg/dL (ref 100–199)
HDL Particle Number: 31.7 umol/L (ref >=30.5)
HDL-C: 59 mg/dL (ref >39)
LDL Particle Number: 737 nmol/L (ref <1000)
LDL Size: 20.9 nm (ref >20.5)
LDL-C (NIH Calc): 75 mg/dL (ref 0–99)
LP-IR Score: 31 (ref <=45)
Small LDL Particle Number: 334 nmol/L (ref <=527)
Triglycerides: 52 mg/dL (ref 0–149)

## 2022-09-26 ENCOUNTER — Other Ambulatory Visit: Payer: Self-pay | Admitting: Internal Medicine

## 2022-09-27 LAB — CBC
HCT: 41.5 % (ref 35.0–45.0)
Hemoglobin: 13 g/dL (ref 11.7–15.5)
MCH: 25.9 pg — ABNORMAL LOW (ref 27.0–33.0)
MCHC: 31.3 g/dL — ABNORMAL LOW (ref 32.0–36.0)
MCV: 82.7 fL (ref 80.0–100.0)
MPV: 11.2 fL (ref 7.5–12.5)
Platelets: 233 10*3/uL (ref 140–400)
RBC: 5.02 10*6/uL (ref 3.80–5.10)
RDW: 13.9 % (ref 11.0–15.0)
WBC: 10.6 10*3/uL (ref 3.8–10.8)

## 2022-09-27 LAB — COMPLETE METABOLIC PANEL WITH GFR
AG Ratio: 1.2 (calc) (ref 1.0–2.5)
ALT: 20 U/L (ref 6–29)
AST: 17 U/L (ref 10–35)
Albumin: 4 g/dL (ref 3.6–5.1)
Alkaline phosphatase (APISO): 61 U/L (ref 37–153)
BUN: 12 mg/dL (ref 7–25)
CO2: 24 mmol/L (ref 20–32)
Calcium: 9.2 mg/dL (ref 8.6–10.4)
Chloride: 105 mmol/L (ref 98–110)
Creat: 0.62 mg/dL (ref 0.50–1.03)
Globulin: 3.3 g/dL (calc) (ref 1.9–3.7)
Glucose, Bld: 86 mg/dL (ref 65–99)
Potassium: 3.8 mmol/L (ref 3.5–5.3)
Sodium: 141 mmol/L (ref 135–146)
Total Bilirubin: 0.3 mg/dL (ref 0.2–1.2)
Total Protein: 7.3 g/dL (ref 6.1–8.1)
eGFR: 104 mL/min/{1.73_m2} (ref >=60)

## 2022-09-27 LAB — LIPID PANEL
Cholesterol: 144 mg/dL (ref <200)
HDL: 57 mg/dL (ref >=50)
LDL Cholesterol (Calc): 74 mg/dL (calc)
Non-HDL Cholesterol (Calc): 87 mg/dL (calc) (ref <130)
Total CHOL/HDL Ratio: 2.5 (calc) (ref <5.0)
Triglycerides: 54 mg/dL (ref <150)

## 2022-09-27 LAB — TSH: TSH: 0.64 mIU/L (ref 0.40–4.50)

## 2022-09-27 LAB — VITAMIN D 25 HYDROXY (VIT D DEFICIENCY, FRACTURES): Vit D, 25-Hydroxy: 52 ng/mL (ref 30–100)

## 2022-10-22 ENCOUNTER — Ambulatory Visit: Payer: BC Managed Care – PPO | Admitting: Podiatry

## 2022-10-22 DIAGNOSIS — M7661 Achilles tendinitis, right leg: Secondary | ICD-10-CM

## 2022-10-22 DIAGNOSIS — M9261 Juvenile osteochondrosis of tarsus, right ankle: Secondary | ICD-10-CM

## 2022-10-22 MED ORDER — DICLOFENAC SODIUM 1 % EX GEL: 4.0000 g | Freq: Four times a day (QID) | CUTANEOUS | 4 refills | Status: DC

## 2022-10-22 NOTE — Patient Instructions (Signed)
Call [facility] to schedule your MRI at the below locations.  Please allow at least 1 business day after your visit to process the referral.  It may take longer depending on approval from insurance.  Please let me know if you have issues or problems scheduling the MRI   [facility] [phone] [address] [place], [place] [address]  [facility] [phone] [address] [place], [place] [address]  

## 2022-10-22 NOTE — Progress Notes (Signed)
  Subjective:  Patient ID: Katrina Carrillo, female    DOB: 04-Jan-1966,  MRN: [id number]  Chief Complaint  Patient presents with   Foot Problem    Pt states her right foot was doing better but now it is getting swollen again     57 y.o. female presents with the above complaint. History confirmed with patient.  Was doing better but has worsening and over the last month.  She is having to use the boot occasionally.  Objective:  Physical Exam: warm, good capillary refill, no trophic changes or ulcerative lesions, normal DP and PT pulses, normal sensory exam  Right Foot: Today has quite a bit of pain to palpation to the insertion and mid substance  No images are attached to the encounter.  Radiographs: Multiple views x-ray of the right foot: no fracture, dislocation, swelling or degenerative changes noted, plantar calcaneal spur, posterior calcaneal spur, and Haglund deformity noted Assessment:   1. Tendonitis, Achilles, right   2. Haglund's deformity of right heel      Plan:  Patient was evaluated and treated and all questions answered.  Has worsened again.  At this point is been a chronic recalcitrant problem for her and she has been dealing with this for years, she previously was treated here by Dr. Marylene Land as well.  The right side continues to give her enough trouble that she has to wear the boot fairly consistently.  Long-term I think best we start to begin to look at surgical options and I recommend an MRI to fully evaluate the tendon for tendinosis micro tearing and bone marrow edema along the having a deformity and insertion.  Referral was placed for the MRI I will see her back after the study and discuss further options with her.  In the interim continue home physical therapy plan as discussed previously, utilize boot as needed and Voltaren gel, refill of this was sent to her pharmacy.   Return for after MRI to review.

## 2022-11-22 ENCOUNTER — Encounter: Payer: Self-pay | Admitting: Podiatry

## 2022-11-26 ENCOUNTER — Ambulatory Visit
Admission: RE | Admit: 2022-11-26 | Discharge: 2022-11-26 | Disposition: A | Discharge status: Home / Self Care | Payer: BC Managed Care – PPO | Source: Ambulatory Visit | Attending: Podiatry | Admitting: Podiatry

## 2022-11-26 DIAGNOSIS — M7661 Achilles tendinitis, right leg: Secondary | ICD-10-CM

## 2022-11-26 DIAGNOSIS — M9261 Juvenile osteochondrosis of tarsus, right ankle: Secondary | ICD-10-CM

## 2022-12-04 ENCOUNTER — Encounter: Payer: Self-pay | Admitting: Gastroenterology

## 2022-12-05 ENCOUNTER — Encounter: Payer: Self-pay | Admitting: Internal Medicine

## 2022-12-08 NOTE — Telephone Encounter (Signed)
PT with high lipids, Lpa LVEF down    Plan was primary prevention DId not tolerate statins  Could she qualify for Repatha?

## 2022-12-20 ENCOUNTER — Telehealth: Payer: Self-pay

## 2022-12-20 ENCOUNTER — Other Ambulatory Visit (HOSPITAL_COMMUNITY): Payer: Self-pay

## 2022-12-20 ENCOUNTER — Telehealth: Payer: Self-pay | Admitting: *Deleted

## 2022-12-20 NOTE — Telephone Encounter (Signed)
PA request has been Submitted. New Encounter created for follow up. For additional info see Pharmacy Prior Auth telephone encounter from 12/20/22.

## 2022-12-20 NOTE — Telephone Encounter (Signed)
Pharmacy Patient Advocate Encounter   Received notification from Physician's Office that prior authorization for REPATHA is required/requested.   Insurance verification completed.   The patient is insured through [employer] [facility] .   Per test claim: PA required; PA submitted to [employer] [facility] via CoverMyMeds Key/confirmation #/[facility] Status is pending

## 2022-12-23 ENCOUNTER — Other Ambulatory Visit (HOSPITAL_COMMUNITY): Payer: Self-pay

## 2022-12-23 NOTE — Telephone Encounter (Signed)
Pharmacy Patient Advocate Encounter  Received notification from [employer] [facility] that Prior Authorization for REPATHA has been DENIED.  Full denial letter will be uploaded to the media tab. See denial reason below.  Criteria not met per plan

## 2022-12-23 NOTE — Telephone Encounter (Signed)
Katrina Carrillo stated OK to proceed

## 2022-12-25 ENCOUNTER — Ambulatory Visit (AMBULATORY_SURGERY_CENTER): Payer: BC Managed Care – PPO

## 2022-12-25 ENCOUNTER — Other Ambulatory Visit: Payer: Self-pay

## 2022-12-25 VITALS — Ht 63.0 in | Wt 215.0 lb

## 2022-12-25 DIAGNOSIS — Z86010 Personal history of colonic polyps: Secondary | ICD-10-CM

## 2022-12-25 DIAGNOSIS — Z800 Family history of malignant neoplasm of digestive organs: Secondary | ICD-10-CM

## 2022-12-25 MED ORDER — NA SULFATE-K SULFATE-MG SULF 17.5-3.13-1.6 GM/177ML PO SOLN
1.0000 | Freq: Once | ORAL | 0 refills | Status: AC
Start: 2022-12-25 — End: 2022-12-25

## 2022-12-25 NOTE — Progress Notes (Signed)
Denies allergies to eggs or soy products. Denies complication of anesthesia or sedation. Denies use of weight loss medication. Denies use of O2.   Emmi instructions given for colonoscopy.  

## 2022-12-26 ENCOUNTER — Other Ambulatory Visit: Payer: Self-pay | Admitting: Internal Medicine

## 2022-12-31 ENCOUNTER — Telehealth: Payer: Self-pay | Admitting: Internal Medicine

## 2022-12-31 NOTE — Telephone Encounter (Signed)
Patient came in and dropped off appeal paperwork for her medication . The paperwork will be in Dr. Tenny Craw box by EOD today .  Thank you

## 2023-01-06 ENCOUNTER — Ambulatory Visit (INDEPENDENT_AMBULATORY_CARE_PROVIDER_SITE_OTHER): Payer: BC Managed Care – PPO | Admitting: Podiatry

## 2023-01-06 DIAGNOSIS — M7661 Achilles tendinitis, right leg: Secondary | ICD-10-CM | POA: Diagnosis not present

## 2023-01-06 NOTE — Telephone Encounter (Signed)
Appeals submitted 10/7

## 2023-01-06 NOTE — Progress Notes (Signed)
Subjective:  Patient ID: Katrina Carrillo, female    DOB: May 10, 1965,  MRN: [id number]  Chief Complaint  Patient presents with   Foot Pain    Review result of MRI on R foot.  Foot pain, boot reduces pressure    57 y.o. female presents with the above complaint. History confirmed with patient.  Still continues to improve, the boot has been helpful not under percent better but much better than when she started treatment again Objective:  Physical Exam: warm, good capillary refill, no trophic changes or ulcerative lesions, normal DP and PT pulses, normal sensory exam  Right Foot: Today she has a pain at the insertion and more in the Haglund and retrocalcaneal area  No images are attached to the encounter.  Radiographs: Multiple views x-ray of the right foot: no fracture, dislocation, swelling or degenerative changes noted, plantar calcaneal spur, posterior calcaneal spur, and Haglund deformity noted  Study Result  Narrative & Impression  CLINICAL DATA:  Right heel and Achilles pain, weakness, medial pain, no known injury   EXAM: MR OF THE RIGHT HEEL WITHOUT CONTRAST   TECHNIQUE: Multiplanar, multisequence MR imaging of the right heel was performed. No intravenous contrast was administered.   COMPARISON:  None Available.   FINDINGS: TENDONS   Peroneal: Mild tendinosis of the peroneus brevis. Mild tendinosis of the peroneus longus.   Posteromedial: Posterior tibial tendon intact. Flexor hallucis longus tendon intact. Flexor digitorum longus tendon intact.   Anterior: Tibialis anterior tendon intact. Extensor hallucis longus tendon intact Extensor digitorum longus tendon intact.   Achilles: Moderate tendinosis of the Achilles tendon. Moderate amount of fluid in the retrocalcaneal bursa consistent with bursitis.   Plantar Fascia: Intact.   LIGAMENTS   Lateral: Anterior talofibular ligament intact. Calcaneofibular ligament intact. Posterior talofibular ligament  intact. Anterior and posterior tibiofibular ligaments intact.   Medial: Deltoid ligament intact. Spring ligament intact.   CARTILAGE   Ankle Joint: No joint effusion. Subchondral marrow edema in the medial corner of the talar dome without significant overlying cartilage loss.   Subtalar Joints/Sinus Tarsi: Normal subtalar joints. No subtalar joint effusion. Normal sinus tarsi.   Bones: No aggressive osseous lesion. No fracture or dislocation. Mild osteoarthritis of the calcaneocuboid joint. Mild osteoarthritis of the first TMT joint. Mild osteoarthritis of the second and third TMT joints.   Soft Tissue: No fluid collection or hematoma. Muscles are normal without edema or atrophy. Tarsal tunnel is normal.   IMPRESSION: 1. Moderate tendinosis of the Achilles tendon. Retrocalcaneal bursitis. 2. Mild tendinosis of the peroneus brevis. Mild tendinosis of the peroneus longus.     Electronically Signed   By: Elige Ko M.D.   On: 12/13/2022 09:17    Assessment:   1. Achilles bursitis of right lower extremity      Plan:  Patient was evaluated and treated and all questions answered.  We reviewed the results of her MRI we discussed the presence of the tendinosis and the retrocalcaneal bursitis from the Haglund deformity.  We discussed treatment options of this long-term including injection therapy with corticosteroid or platelet rich plasma combined with gastrocnemius recession and percutaneous debridement of the tendon as well as significant reconstruction with secondary repair of the tendon, reanchoring of the tendon reduction of the Haglund and retrocalcaneal spur.  She has had quite a bit of improvement from utilizing the cam walker boot and is less tender the main area of pain is in the area that the retrocalcaneal bursa is noted on her MRI.  We discussed treatment of this including injection therapy with corticosteroid.  We discussed that there is a low chance of rupture but  possible with an injection and I recommend all her weightbearing and walking be in her boot and she avoid weightbearing exercise on the foot for now.  Following consent and sterile prep with alcohol and utilizing ethyl chloride spray as a topical anesthetic the retrocalcaneal bursa deep to the Achilles tendon and the Kager's triangle was injected with 10 mg of Kenalog 4 mg of dexamethasone and 0.5 cc each of 2% lidocaine and 0.5% Marcaine plain.  She tolerated this well and it was dressed with a Band-Aid.  I will see her back in 6 weeks to reevaluate.   Return in about 6 weeks (around 02/17/2023) for re-check Achilles tendon.

## 2023-01-08 ENCOUNTER — Encounter: Payer: Self-pay | Admitting: Gastroenterology

## 2023-01-09 NOTE — Telephone Encounter (Signed)
Appeals submitted by the Pharm D on 01/06/23 will close this encounter.

## 2023-01-19 ENCOUNTER — Encounter: Payer: Self-pay | Admitting: Certified Registered Nurse Anesthetist

## 2023-01-24 ENCOUNTER — Ambulatory Visit (AMBULATORY_SURGERY_CENTER): Payer: BC Managed Care – PPO | Admitting: Gastroenterology

## 2023-01-24 ENCOUNTER — Encounter: Payer: Self-pay | Admitting: Gastroenterology

## 2023-01-24 VITALS — BP 136/73 | HR 69 | Temp 97.3°F | Resp 10 | Ht 63.0 in | Wt 215.0 lb

## 2023-01-24 DIAGNOSIS — Z1211 Encounter for screening for malignant neoplasm of colon: Secondary | ICD-10-CM

## 2023-01-24 DIAGNOSIS — D120 Benign neoplasm of cecum: Secondary | ICD-10-CM

## 2023-01-24 DIAGNOSIS — D122 Benign neoplasm of ascending colon: Secondary | ICD-10-CM | POA: Diagnosis not present

## 2023-01-24 DIAGNOSIS — Z800 Family history of malignant neoplasm of digestive organs: Secondary | ICD-10-CM | POA: Diagnosis not present

## 2023-01-24 MED ORDER — SODIUM CHLORIDE 0.9 % IV SOLN: 500.0000 mL | INTRAVENOUS | Status: DC

## 2023-01-24 NOTE — Progress Notes (Signed)
Pt's states no medical or surgical changes since previsit or office visit. 

## 2023-01-24 NOTE — Progress Notes (Signed)
[facility] History and Physical   Primary Care Physician:  Fleet Contras, MD   Reason for Procedure:  History of adenomatous colon polyps, family h/o colon cancer  Plan:    Surveillance colonoscopy with possible interventions as needed     HPI: Katrina Carrillo is a very pleasant 57 y.o. female here for surveillance colonoscopy. Denies any nausea, vomiting, abdominal pain, melena or bright red blood per rectum  The risks and benefits as well as alternatives of endoscopic procedure(s) have been discussed and reviewed. All questions answered. The patient agrees to proceed.    Past Medical History:  Diagnosis Date   Allergy    Arthritis    Asthma    right knee    CHF (congestive heart failure) (HCC)    cardiomyopathy   Diverticulosis    Family history of malignant neoplasm of gastrointestinal tract 02/17/2012   Father with colon cancer age 39    GASTROESOPHAGEAL REFLUX DISEASE 02/22/2009   Qualifier: Diagnosis of  By: Yetta Barre, CNA, Deborah     GERD (gastroesophageal reflux disease)    pt states doesnt have recently    Hyperlipidemia    Hypertension    Nonischemic cardiomyopathy (HCC)    LHC 01/2009: Normal coronary arteries, EF 15%. Last echo 09/2010: EF 35-40%, grade 2 diastolic dysfunction, trivial MR, mild LAE.8/14 EF 40-45%    Obesity (BMI 30-39.9) 12/07/2015   OSA (obstructive sleep apnea) 06/12/2015   Mild OSA with total AHI 8/hr and in REM sleep 26/hr with oxygen desaturations as low as 70% with respiratory events.     OSA (obstructive sleep apnea)    use CPAP nightly   Seasonal allergies    Sleep apnea    wears cpap    Past Surgical History:  Procedure Laterality Date   CESAREAN SECTION  07/11/1991   COLONOSCOPY     POLYSOMNOGRAPHY 4 OR MORE PARAMETERS  08/03/2015   TUBAL LIGATION     S/P    Prior to Admission medications   Medication Sig Start Date End Date Taking? Authorizing Provider  albuterol (VENTOLIN HFA) 108 (90 Base) MCG/ACT  inhaler ProAir HFA 90 mcg/actuation aerosol inhaler  inhale 2 puffs by mouth every 4 hours if needed for cough or whee...  (REFER TO PRESCRIPTION NOTES).    [provider]  atorvastatin (LIPITOR) 20 MG tablet Take 1 tablet (20 mg total) by mouth daily. Patient not taking: Reported on 12/25/2022 07/02/22   Pricilla Riffle, MD  carvedilol (COREG) 6.25 MG tablet TAKE 2 TABLETS(12.5 MG) BY MOUTH EVERY 12 HOURS 12/26/22   Pricilla Riffle, MD  diclofenac Sodium (VOLTAREN) 1 % GEL Apply 4 g topically 4 (four) times daily. 10/22/22   McDonald, Rachelle Hora, DPM  EPINEPHrine 0.3 mg/0.3 mL IJ SOAJ injection EpiPen 2-Pak 0.3 mg/0.3 mL injection, auto-injector    [provider]  furosemide (LASIX) 40 MG tablet Take 1 tablet (40 mg total) by mouth 2 (two) times daily. 08/20/18   Bhagat, Sharrell Ku, PA  hydrALAZINE (APRESOLINE) 25 MG tablet TAKE 1 TABLET(25 MG) BY MOUTH TWICE DAILY 06/24/22   Pricilla Riffle, MD  ipratropium (ATROVENT) 0.03 % nasal spray ipratropium bromide 21 mcg (0.03 %) nasal spray    [provider]  isosorbide mononitrate (IMDUR) 30 MG 24 hr tablet Take 1 tablet (30 mg total) by mouth daily. 08/20/18   Bhagat, Sharrell Ku, PA  loratadine (CLARITIN) 10 MG tablet Take 10 mg by mouth daily.    [provider]  NASONEX 50 MCG/ACT nasal  spray Place 2 sprays into both nostrils as needed.  03/14/14   [provider]  OVER THE COUNTER MEDICATION Amino Acid capsules, two daily.    [provider]  potassium chloride SA (KLOR-CON) 20 MEQ tablet TAKE 2 TABLETS BY MOUTH 2 TIMES A DAY 05/17/19   Bhagat, Bhavinkumar, PA  TURMERIC PO Take by mouth 2 (two) times daily.    [provider]  UNABLE TO FIND Inhale 2 sprays into the lungs as needed. qvar inhaler    [provider]  Vitamin D, Ergocalciferol, (DRISDOL) 1.25 MG (50000 UNIT) CAPS capsule Take 1 capsule (50,000 Units total) by mouth every 7 (seven) days. 01/10/20   Thomasene Lot, DO     Current Outpatient Medications  Medication Sig Dispense Refill   albuterol (VENTOLIN HFA) 108 (90 Base) MCG/ACT inhaler ProAir HFA 90 mcg/actuation aerosol inhaler  inhale 2 puffs by mouth every 4 hours if needed for cough or whee...  (REFER TO PRESCRIPTION NOTES).     atorvastatin (LIPITOR) 20 MG tablet Take 1 tablet (20 mg total) by mouth daily. (Patient not taking: Reported on 12/25/2022) 90 tablet 3   carvedilol (COREG) 6.25 MG tablet TAKE 2 TABLETS(12.5 MG) BY MOUTH EVERY 12 HOURS 360 tablet 1   diclofenac Sodium (VOLTAREN) 1 % GEL Apply 4 g topically 4 (four) times daily. 100 g 4   EPINEPHrine 0.3 mg/0.3 mL IJ SOAJ injection EpiPen 2-Pak 0.3 mg/0.3 mL injection, auto-injector     furosemide (LASIX) 40 MG tablet Take 1 tablet (40 mg total) by mouth 2 (two) times daily. 180 tablet 3   hydrALAZINE (APRESOLINE) 25 MG tablet TAKE 1 TABLET(25 MG) BY MOUTH TWICE DAILY 180 tablet 1   ipratropium (ATROVENT) 0.03 % nasal spray ipratropium bromide 21 mcg (0.03 %) nasal spray     isosorbide mononitrate (IMDUR) 30 MG 24 hr tablet Take 1 tablet (30 mg total) by mouth daily. 90 tablet 3   loratadine (CLARITIN) 10 MG tablet Take 10 mg by mouth daily.     NASONEX 50 MCG/ACT nasal spray Place 2 sprays into both nostrils as needed.   0   OVER THE COUNTER MEDICATION Amino Acid capsules, two daily.     potassium chloride SA (KLOR-CON) 20 MEQ tablet TAKE 2 TABLETS BY MOUTH 2 TIMES A DAY 120 tablet 6   TURMERIC PO Take by mouth 2 (two) times daily.     UNABLE TO FIND Inhale 2 sprays into the lungs as needed. qvar inhaler     Vitamin D, Ergocalciferol, (DRISDOL) 1.25 MG (50000 UNIT) CAPS capsule Take 1 capsule (50,000 Units total) by mouth every 7 (seven) days. 4 capsule 0   Current Facility-Administered Medications  Medication Dose Route Frequency Provider Last Rate Last Admin   0.9 %  sodium chloride infusion  500 mL Intravenous Continuous Sharlon Pfohl, Eleonore Chiquito, MD        Allergies as of 01/24/2023 -  Review Complete 01/24/2023  Allergen Reaction Noted   Peanut-containing drug products Anaphylaxis 02/27/2009   Shellfish allergy Anaphylaxis 01/02/2012   Vasotec [enalapril] Swelling 12/14/2018   Atorvastatin Hives 12/25/2022   Iodine Swelling and Rash 02/27/2009   Rosuvastatin Rash 07/02/2022    Family History  Problem Relation Age of Onset   Diabetes Mother    Cancer Mother        vulvular cancer   Hypertension Mother    Obesity Mother    Coronary artery disease Father    Colon cancer Father 8   Cancer Father  Hypertension Father    Alcoholism Father    Diabetes Maternal Grandmother    Cancer Paternal Grandmother    Mental retardation Son        TBI during birth   Hypertension Other    Stomach cancer Neg Hx    Colon polyps Neg Hx    Rectal cancer Neg Hx    Breast cancer Neg Hx    Esophageal cancer Neg Hx     Social History   Socioeconomic History   Marital status: Single    Spouse name: n/a   Number of children: 1   Years of education: Not on file   Highest education level: Not on file  Occupational History   Occupation: Conservation officer, historic buildings: UNEMPLOYED    Comment: Journalist, newspaper   Occupation: Geophysical data processor: [employer]  Tobacco Use   Smoking status: Former    Current packs/day: 0.00    Types: Cigarettes    Start date: 04/01/1986    Quit date: 04/01/1988    Years since quitting: 34.8   Smokeless tobacco: Never  Vaping Use   Vaping status: Never Used  Substance and Sexual Activity   Alcohol use: Yes    Comment: social   Drug use: No   Sexual activity: Not on file  Other Topics Concern   Not on file  Social History Narrative   Lives with her son, Donovon, who has special needs.   Divorced.   Social Determinants of Health   Financial Resource Strain: Not on file  Food Insecurity: Not on file  Transportation Needs: Not on file  Physical Activity: Not on file  Stress: Not on file  Social Connections: Not on  file  Intimate Partner Violence: Not on file    Review of Systems:  All other review of systems negative except as mentioned in the HPI.  Physical Exam: Vital signs in last 24 hours: BP (!) 140/68   Pulse 70   Temp (!) 97.3 F (36.3 C) (Temporal)   Ht 5\' 3"  (1.6 m)   Wt 215 lb (97.5 kg)   SpO2 98%   BMI 38.09 kg/m  General:   Alert, NAD Lungs:  Clear .   Heart:  Regular rate and rhythm Abdomen:  Soft, nontender and nondistended. Neuro/Psych:  Alert and cooperative. Normal mood and affect. A and O x 3  Reviewed labs, radiology imaging, old records and pertinent past GI work up  Patient is appropriate for planned procedure(s) and anesthesia in an ambulatory setting   K. Scherry Ran , MD [phone]

## 2023-01-24 NOTE — Op Note (Signed)
[facility] Patient Name: Katrina Carrillo Procedure Date: 01/24/2023 1:16 PM MRN: [id number] Endoscopist: Napoleon Form , MD, [id number] Age: 57 Referring MD:  Date of Birth: Jul 27, 1965 Gender: Female Account #: [id number] Procedure:                Colonoscopy Indications:              Screening in patient at increased risk: Family                            history of 1st-degree relative with colorectal                            cancer, High risk colon cancer surveillance:                            Personal history of colonic polyps, High risk colon                            cancer surveillance: Personal history of adenoma                            less than 10 mm in size Medicines:                Monitored Anesthesia Care Procedure:                Pre-Anesthesia Assessment:                           - Prior to the procedure, a History and Physical                            was performed, and patient medications and                            allergies were reviewed. The patient's tolerance of                            previous anesthesia was also reviewed. The risks                            and benefits of the procedure and the sedation                            options and risks were discussed with the patient.                            All questions were answered, and informed consent                            was obtained. Prior Anticoagulants: The patient has                            taken no anticoagulant or antiplatelet agents. ASA  Grade Assessment: III - A patient with severe                            systemic disease. After reviewing the risks and                            benefits, the patient was deemed in satisfactory                            condition to undergo the procedure.                           After obtaining informed consent, the colonoscope                            was passed under direct vision.  Throughout the                            procedure, the patient's blood pressure, pulse, and                            oxygen saturations were monitored continuously. The                            Olympus Scope [id number] was introduced through the                            anus and advanced to the the cecum, identified by                            appendiceal orifice and ileocecal valve. The                            colonoscopy was performed without difficulty. The                            patient tolerated the procedure well. The quality                            of the bowel preparation was good. The ileocecal                            valve, appendiceal orifice, and rectum were                            photographed. Scope In: 1:25:03 PM Scope Out: 1:40:57 PM Scope Withdrawal Time: 0 hours 13 minutes 51 seconds  Total Procedure Duration: 0 hours 15 minutes 54 seconds  Findings:                 The perianal and digital rectal examinations were                            normal.  A 15 mm polyp was found in the cecum. The polyp was                            pedunculated. The polyp was removed with a hot                            snare. Resection and retrieval were complete.                           A 1 mm polyp was found in the ascending colon. The                            polyp was sessile. The polyp was removed with a                            cold biopsy forceps. Resection and retrieval were                            complete.                           Non-bleeding external and internal hemorrhoids were                            found during retroflexion. The hemorrhoids were                            small.                           Scattered small-mouthed diverticula were found in                            the sigmoid colon, transverse colon and ascending                            colon.                           Non-bleeding  external and internal hemorrhoids were                            found during retroflexion. The hemorrhoids were                            medium-sized. Complications:            No immediate complications. Estimated Blood Loss:     Estimated blood loss was minimal. Impression:               - One 15 mm polyp in the cecum, removed with a hot                            snare. Resected and retrieved.                           -  One 1 mm polyp in the ascending colon, removed                            with a cold biopsy forceps. Resected and retrieved.                           - Non-bleeding external and internal hemorrhoids.                           - Diverticulosis in the sigmoid colon, in the                            transverse colon and in the ascending colon.                           - Non-bleeding external and internal hemorrhoids. Recommendation:           - Patient has a contact number available for                            emergencies. The signs and symptoms of potential                            delayed complications were discussed with the                            patient. Return to normal activities tomorrow.                            Written discharge instructions were provided to the                            patient.                           - Resume previous diet.                           - Continue present medications.                           - Await pathology results.                           - Repeat colonoscopy in 3 years for surveillance                            based on pathology results. Napoleon Form, MD 01/24/2023 1:47:29 PM This report has been signed electronically.

## 2023-01-24 NOTE — Patient Instructions (Addendum)
Resume all of your previous medicines as ordered. No NSAIDS: ibuprofen, aspirin, aleve for 2 weeks. Read the handouts given to you by your recover room nurse.  Read your discharge instructions.  YOU HAD AN ENDOSCOPIC PROCEDURE TODAY AT THE [facility]:   Refer to the procedure report that was given to you for any specific questions about what was found during the examination.  If the procedure report does not answer your questions, please call your gastroenterologist to clarify.  If you requested that your care partner not be given the details of your procedure findings, then the procedure report has been included in a sealed envelope for you to review at your convenience later.  YOU SHOULD EXPECT: Some feelings of bloating in the abdomen. Passage of more gas than usual.  Walking can help get rid of the air that was put into your GI tract during the procedure and reduce the bloating. If you had a lower endoscopy (such as a colonoscopy or flexible sigmoidoscopy) you may notice spotting of blood in your stool or on the toilet paper. If you underwent a bowel prep for your procedure, you may not have a normal bowel movement for a few days.  Please Note:  You might notice some irritation and congestion in your nose or some drainage.  This is from the oxygen used during your procedure.  There is no need for concern and it should clear up in a day or so.  SYMPTOMS TO REPORT IMMEDIATELY:  Following lower endoscopy (colonoscopy or flexible sigmoidoscopy):  Excessive amounts of blood in the stool  Significant tenderness or worsening of abdominal pains  Swelling of the abdomen that is new, acute  Fever of 100F or higher   For urgent or emergent issues, a gastroenterologist can be reached at any hour by calling [phone]. Do not use MyChart messaging for urgent concerns.    DIET:  We do recommend a small meal at first, but then you may proceed to your regular diet.  Drink plenty of fluids  but you should avoid alcoholic beverages for 24 hours.  ACTIVITY:  You should plan to take it easy for the rest of today and you should NOT DRIVE or use heavy machinery until tomorrow (because of the sedation medicines used during the test).    FOLLOW UP: Our staff will call the number listed on your records the next business day following your procedure.  We will call around 7:15- 8:00 am to check on you and address any questions or concerns that you may have regarding the information given to you following your procedure. If we do not reach you, we will leave a message.     If any biopsies were taken you will be contacted by phone or by letter within the next 1-3 weeks.  Please call us at [phone] if you have not heard about the biopsies in 3 weeks.    SIGNATURES/CONFIDENTIALITY: You and/or your care partner have signed paperwork which will be entered into your electronic medical record.  These signatures attest to the fact that that the information above on your After Visit Summary has been reviewed and is understood.  Full responsibility of the confidentiality of this discharge information lies with you and/or your care-partner.

## 2023-01-24 NOTE — Progress Notes (Signed)
Called to room to assist during endoscopic procedure.  Patient ID and intended procedure confirmed with present staff. Received instructions for my participation in the procedure from the performing physician.  

## 2023-01-24 NOTE — Progress Notes (Signed)
Report given to PACU, vss 

## 2023-01-27 ENCOUNTER — Telehealth: Payer: Self-pay

## 2023-01-27 NOTE — Telephone Encounter (Signed)
  Follow up Call-     01/24/2023   12:25 PM  Call back number  Post procedure Call Back phone  # [phone]  Permission to leave phone message Yes     Left message

## 2023-01-29 LAB — SURGICAL PATHOLOGY

## 2023-02-03 ENCOUNTER — Encounter: Payer: Self-pay | Admitting: Gastroenterology

## 2023-02-17 ENCOUNTER — Ambulatory Visit (INDEPENDENT_AMBULATORY_CARE_PROVIDER_SITE_OTHER): Payer: BC Managed Care – PPO | Admitting: Podiatry

## 2023-02-17 ENCOUNTER — Encounter: Payer: Self-pay | Admitting: Podiatry

## 2023-02-17 DIAGNOSIS — M7661 Achilles tendinitis, right leg: Secondary | ICD-10-CM

## 2023-02-17 NOTE — Progress Notes (Signed)
  Subjective:  Patient ID: Katrina Carrillo, female    DOB: 1966/03/29,  MRN: [id number]  Chief Complaint  Patient presents with   Foot Pain    RM#20 Follow up patient states has some pain due to walking without the boot but other wise doing better.    57 y.o. female presents with the above complaint. History confirmed with patient.   she notes significant improvement no pain in the boot she has only mild pain if she is on it out of the boot  Objective:  Physical Exam: warm, good capillary refill, no trophic changes or ulcerative lesions, normal DP and PT pulses, normal sensory exam  Right Foot: Today no pain in retrocalcaneal area or with palpation at the insertion on the posterior heel  No images are attached to the encounter.  Radiographs: Multiple views x-ray of the right foot: no fracture, dislocation, swelling or degenerative changes noted, plantar calcaneal spur, posterior calcaneal spur, and Haglund deformity noted  Study Result  Narrative & Impression  CLINICAL DATA:  Right heel and Achilles pain, weakness, medial pain, no known injury   EXAM: MR OF THE RIGHT HEEL WITHOUT CONTRAST   TECHNIQUE: Multiplanar, multisequence MR imaging of the right heel was performed. No intravenous contrast was administered.   COMPARISON:  None Available.   FINDINGS: TENDONS   Peroneal: Mild tendinosis of the peroneus brevis. Mild tendinosis of the peroneus longus.   Posteromedial: Posterior tibial tendon intact. Flexor hallucis longus tendon intact. Flexor digitorum longus tendon intact.   Anterior: Tibialis anterior tendon intact. Extensor hallucis longus tendon intact Extensor digitorum longus tendon intact.   Achilles: Moderate tendinosis of the Achilles tendon. Moderate amount of fluid in the retrocalcaneal bursa consistent with bursitis.   Plantar Fascia: Intact.   LIGAMENTS   Lateral: Anterior talofibular ligament intact. Calcaneofibular ligament intact.  Posterior talofibular ligament intact. Anterior and posterior tibiofibular ligaments intact.   Medial: Deltoid ligament intact. Spring ligament intact.   CARTILAGE   Ankle Joint: No joint effusion. Subchondral marrow edema in the medial corner of the talar dome without significant overlying cartilage loss.   Subtalar Joints/Sinus Tarsi: Normal subtalar joints. No subtalar joint effusion. Normal sinus tarsi.   Bones: No aggressive osseous lesion. No fracture or dislocation. Mild osteoarthritis of the calcaneocuboid joint. Mild osteoarthritis of the first TMT joint. Mild osteoarthritis of the second and third TMT joints.   Soft Tissue: No fluid collection or hematoma. Muscles are normal without edema or atrophy. Tarsal tunnel is normal.   IMPRESSION: 1. Moderate tendinosis of the Achilles tendon. Retrocalcaneal bursitis. 2. Mild tendinosis of the peroneus brevis. Mild tendinosis of the peroneus longus.     Electronically Signed   By: Elige Ko M.D.   On: 12/13/2022 09:17    Assessment:   1. Achilles bursitis of right lower extremity      Plan:  Patient was evaluated and treated and all questions answered.  Doing much better and has responded well to corticosteroid injection and boot immobilization to rest the soft tissues.  She will transition out of the cam walker boot gradually and dispensed felt heel lifts to use in her shoes to offload the Achilles further.  She will notify me if she has any further issues for returns again we may consider PRP injection surgical reconstruction may be an option long-term if it worsens significantly.   Return if symptoms worsen or fail to improve.

## 2023-03-28 ENCOUNTER — Other Ambulatory Visit: Payer: Self-pay | Admitting: Internal Medicine

## 2023-04-17 ENCOUNTER — Encounter: Payer: Self-pay | Admitting: Internal Medicine

## 2023-04-17 ENCOUNTER — Telehealth: Payer: Self-pay | Admitting: Internal Medicine

## 2023-04-17 NOTE — Telephone Encounter (Signed)
Pt has been having left upper shoulder pain and says that it has been constant... mostly when she gets up in the morning. It sometimes radiates to her chest. She says that is not having any SOB and no dizziness associated. I suggested strongly that she have someone take her to the ED since she is worried it is her heart. Last Cath 2010. She has had some dizziness over the past few weeks but not when she has been having any pain.   I made her an appt with an APP 04/18/23 but if she goes to the ED she will let me know and we will cancel it.

## 2023-04-17 NOTE — Progress Notes (Signed)
Cardiology Office Note    Date:  04/18/2023  ID:  Katrina, Carrillo 1965/06/25, MRN [id number] PCP:  Fleet Contras, MD  Cardiologist:  Dietrich Pates, MD  Electrophysiologist:  None   Chief Complaint: Left shoulder and back pain   History of Present Illness: .    Katrina Carrillo is a 58 y.o. female with visit-pertinent history of HTN, chronic systolic CHF, nonischemic cardiomyopathy EF 15% with normal coronaries in 2010, echocardiogram in 12/2017 showed EF 40 to 45% per Dr. Charlott Rakes review, HTN, HLD, OSA and obesity.  She also has a history of angioedema on ACE inhibitor's.  She was last seen in clinic by Dr. Tenny Craw on 06/24/2022.  She remained stable from a cardiac standpoint.  She continued to note some numbness in her right thumb after sleeping.  She noted that a prior rash she developed improved when she stopped Crestor.  Today she presents regarding recent left shoulder/back pain that started yesterday morning.  She notes that she woke up with pain in her left shoulder and back that felt as if it was pulling, when she moved her left arm it made the sensation worse.  She denied any associated symptoms although it felt like it made her breath catch as the pain was sharp.  She reports that she went to her chiropractor yesterday with improvement in discomfort.  Today she notes that the discomfort has improved, she feels it more when she lifts her left arm above her shoulder.  She also reports an occasional sharp pain down her left leg from her left hip that feels like an electrical shock, she notes this may be nerve pain as she has been wearing an ankle support boot on her right foot and had been walking abnormally for a while.  ROS: .   Today she denies lower extremity edema, fatigue, palpitations, melena, hematuria, hemoptysis, diaphoresis, weakness, presyncope, syncope, orthopnea, and PND.  All other systems are reviewed and otherwise negative. Studies Reviewed: Marland Kitchen    EKG:  EKG is  ordered today, personally reviewed, demonstrating  EKG Interpretation Date/Time:  Friday April 18 2023 14:22:37 EST Ventricular Rate:  81 PR Interval:  150 QRS Duration:  78 QT Interval:  390 QTC Calculation: 453 R Axis:   34  Text Interpretation: Normal sinus rhythm Normal ECG Confirmed by Reather Littler [phone]) on 04/18/2023 5:27:07 PM   CV Studies:  Cardiac Studies & Procedures      ECHOCARDIOGRAM  ECHOCARDIOGRAM COMPLETE 01/20/2018  Narrative *[facility]* [address] [place], [place] [address] [phone]  ------------------------------------------------------------------- Transthoracic Echocardiography  Patient:    Katrina, Carrillo MR #:       [id number] Study Date: 01/20/2018 Gender:     F Age:        23 Height:     160 cm Weight:     94.3 kg BSA:        2.09 m^2 Pt. Status: Room:  ATTENDING    Kristeen Miss, M.D. ORDERING     Dietrich Pates, M.D. REFERRING    Dietrich Pates, M.D. SONOGRAPHER  Randa Evens, Will PERFORMING   [facility], Outpatient  cc:  ------------------------------------------------------------------- LV EF: 35% -   40%  ------------------------------------------------------------------- Indications:      (I50.22).  ------------------------------------------------------------------- History:   PMH:  Acquired from the patient and from the patient&'s chart.  Congestive heart failure.  Risk factors:  Former tobacco use. Hypertension. Obese.  ------------------------------------------------------------------- Study Conclusions  - Left ventricle: The cavity size was normal.  Wall thickness was normal. Systolic function was moderately reduced. The estimated ejection fraction was in the range of 35% to 40%. Diffuse hypokinesis. Features are consistent with a pseudonormal left ventricular filling pattern, with concomitant abnormal relaxation and increased filling pressure (grade 2 diastolic  dysfunction).  ------------------------------------------------------------------- Study data:  Comparison was made to the study of 11/12/2012.  Study status:  Routine.  Procedure:  The patient reported no pain pre or post test. Transthoracic echocardiography for left ventricular function evaluation. Image quality was adequate.  Study completion: There were no complications.          Transthoracic echocardiography.  M-mode, complete 2D, spectral Doppler, and color Doppler.  Birthdate:  Patient birthdate: [birth year].  Age:  Patient is 58 yr old.  Sex:  Gender: female.    BMI: 36.8 kg/m^2.  Blood pressure:     112/80  Patient status:  Outpatient.  Study date: Study date: 01/20/2018. Study time: 08:36 AM.  Location:  [facility]  -------------------------------------------------------------------  ------------------------------------------------------------------- Left ventricle:  The cavity size was normal. Wall thickness was normal. Systolic function was moderately reduced. The estimated ejection fraction was in the range of 35% to 40%. Diffuse hypokinesis. Features are consistent with a pseudonormal left ventricular filling pattern, with concomitant abnormal relaxation and increased filling pressure (grade 2 diastolic dysfunction).  ------------------------------------------------------------------- Aortic valve:   Structurally normal valve.   Cusp separation was normal.  Doppler:  Transvalvular velocity was within the normal range. There was no stenosis. There was no regurgitation.  ------------------------------------------------------------------- Aorta:  Aortic root: The aortic root was normal in size. Ascending aorta: The ascending aorta was normal in size.  ------------------------------------------------------------------- Mitral valve:   Structurally normal valve.   Leaflet separation was normal.  Doppler:  Transvalvular velocity was within the normal range. There  was no evidence for stenosis. There was trivial regurgitation.    Valve area by pressure half-time: 3.24 cm^2. Indexed valve area by pressure half-time: 1.55 cm^2/m^2.    Peak gradient (D): 3 mm Hg.  ------------------------------------------------------------------- Left atrium:  The atrium was normal in size.  ------------------------------------------------------------------- Right ventricle:  The cavity size was normal. Systolic function was normal.  ------------------------------------------------------------------- Pulmonic valve:    The valve appears to be grossly normal. Doppler:  There was trivial regurgitation.  ------------------------------------------------------------------- Tricuspid valve:   Structurally normal valve.   Leaflet separation was normal.  Doppler:  Transvalvular velocity was within the normal range. There was no regurgitation.  ------------------------------------------------------------------- Right atrium:  The atrium was normal in size.  ------------------------------------------------------------------- Pericardium:  There was no pericardial effusion.  ------------------------------------------------------------------- Measurements  Left ventricle                         Value          Reference LV ID, ED, PLAX chordal        (H)     53    mm       43 - 52 LV ID, ES, PLAX chordal        (H)     42    mm       23 - 38 LV fx shortening, PLAX chordal (L)     21    %        >=29 LV PW thickness, ED                    11    mm       ---------- IVS/LV  PW ratio, ED                    0.91           <=1.3 Stroke volume, 2D                      61    ml       ---------- Stroke volume/bsa, 2D                  29    ml/m^2   ---------- LV e&', lateral                         8.38  cm/s     ---------- LV E/e&', lateral                       10.25          ---------- LV e&', medial                          8.92  cm/s     ---------- LV E/e&', medial                         9.63           ---------- LV e&', average                         8.65  cm/s     ---------- LV E/e&', average                       9.93           ----------  Ventricular septum                     Value          Reference IVS thickness, ED                      10    mm       ----------  LVOT                                   Value          Reference LVOT ID, S                             23    mm       ---------- LVOT area                              4.15  cm^2     ---------- LVOT ID                                23    mm       ---------- LVOT peak velocity, S                  71.7  cm/s     ---------- LVOT mean velocity, S  51.3  cm/s     ---------- LVOT VTI, S                            14.8  cm       ---------- Stroke volume (SV), LVOT DP            61.5  ml       ---------- Stroke index (SV/bsa), LVOT DP         29.4  ml/m^2   ----------  Aorta                                  Value          Reference Aortic root ID, ED                     27    mm       ---------- Ascending aorta ID, A-P, S             28    mm       ----------  Left atrium                            Value          Reference LA ID, A-P, ES                         36    mm       ---------- LA ID/bsa, A-P                         1.72  cm/m^2   <=2.2 LA volume, S                           49.2  ml       ---------- LA volume/bsa, S                       23.5  ml/m^2   ---------- LA volume, ES, 1-p A4C                 40.2  ml       ---------- LA volume/bsa, ES, 1-p A4C             19.2  ml/m^2   ---------- LA volume, ES, 1-p A2C                 55.5  ml       ---------- LA volume/bsa, ES, 1-p A2C             26.5  ml/m^2   ----------  Mitral valve                           Value          Reference Mitral E-wave peak velocity            85.9  cm/s     ---------- Mitral A-wave peak velocity            78.2  cm/s     ---------- Mitral deceleration time       (H)     232   ms  150  - 230 Mitral pressure half-time              68    ms       ---------- Mitral peak gradient, D                3     mm Hg    ---------- Mitral E/A ratio, peak                 1.1            ---------- Mitral valve area, PHT, DP             3.24  cm^2     ---------- Mitral valve area/bsa, PHT, DP         1.55  cm^2/m^2 ----------  Right atrium                           Value          Reference RA ID, S-I, ES, A4C                    43.7  mm       34 - 49 RA area, ES, A4C                       12.5  cm^2     8.3 - 19.5 RA volume, ES, A/L                     30.2  ml       ---------- RA volume/bsa, ES, A/L                 14.4  ml/m^2   ----------  Systemic veins                         Value          Reference Estimated CVP                          3     mm Hg    ----------  Right ventricle                        Value          Reference TAPSE                                  14.7  mm       ---------- RV s&', lateral, S                      16.5  cm/s     ----------  Legend: (L)  and  (H)  mark values outside specified reference range.  ------------------------------------------------------------------- Prepared and Electronically Authenticated by  Kristeen Miss, M.D. 2019-10-22T13:11:26              Physical Exam:    VS:  BP 108/72   Pulse 81   Ht 5\' 3"  (1.6 m)   Wt 218 lb (98.9 kg)   SpO2 98%   BMI 38.62 kg/m    Wt Readings from Last 3 Encounters:  04/18/23 218 lb (98.9 kg)  01/24/23 215 lb (97.5 kg)  12/25/22 215 lb (97.5  kg)    GEN: Well nourished, well developed in no acute distress NECK: No JVD; No carotid bruits CARDIAC: RRR, no murmurs, rubs, gallops RESPIRATORY:  Clear to auscultation without rales, wheezing or rhonchi  ABDOMEN: Soft, non-tender, non-distended EXTREMITIES:  No edema; No acute deformity   Asessement and Plan:.    Chest pain: LHC in 2010 with normal coronaries. Today she reports left shoulder and left upper back pain at her scapula, that was  worsened with movement and stretching.  She saw her chiropractor yesterday and had an adjustment with improvement in discomfort.  She continues to note some discomfort when moving her left arm today.  Overall sounds musculoskeletal in nature, she will continue to watch and notify the office if she has been changes in presentation. Reviewed ED precautions.   Left leg pain: Patient notes that she had an episode in which it felt like she had a sharp electrical shock down her left leg from her left hip.  She notes that her gait was off as she was previously wearing a support boot on her right foot and had been compensating with her left side.  She has no lower extremity edema, her pulses are strong and equal bilaterally, there is no discoloration.  Question if this is sciatica in nature.  She will continue to monitor and notify if worsening.  CHF: Echocardiogram in 12/2017 showed EF 40 to 45% per Dr. Charlott Rakes review.  Today patient appears euvolemic and well compensated on exam.  HTN: Blood pressure today 108/72, patient notes some occasional lightheadedness in the morning when she first gets up.  She notes that she only takes hydralazine in the morning.  Discussed decreasing or discontinuing her hydralazine, she deferred at this time.  She will continue to monitor her blood pressure at home and notify the office if consistently low.  HLD: Last lipid profile on 09/26/22 indicated total cholesterol 144, HDL 57, LDL 74 and trilgycerides 54.  She reports that she has previously been unable to tolerate statins and has deferred Repatha.    Disposition: F/u with Dr. Tenny Craw in three months or sooner if needed.   Signed, Rip Harbour, NP

## 2023-04-17 NOTE — Telephone Encounter (Signed)
   Pt c/o of Chest Pain: STAT if active CP, including tightness, pressure, jaw pain, radiating pain to shoulder/upper arm/back, CP unrelieved by Nitro. Symptoms reported of SOB, nausea, vomiting, sweating.  1. Are you having CP right now? Shoulder, neck, and back pain on left side    2. Are you experiencing any other symptoms (ex. SOB, nausea, vomiting, sweating)? no   3. Is your CP continuous or coming and going? constant   4. Have you taken Nitroglycerin? Yes with no relief   5. How long have you been experiencing CP?  2 days     6. If NO CP at time of call then end call with telling Pt to call back or call 911 if Chest pain returns prior to return call from triage team.

## 2023-04-18 ENCOUNTER — Ambulatory Visit: Payer: 59 | Attending: Cardiology | Admitting: Cardiology

## 2023-04-18 ENCOUNTER — Encounter: Payer: Self-pay | Admitting: Cardiology

## 2023-04-18 VITALS — BP 108/72 | HR 81 | Ht 63.0 in | Wt 218.0 lb

## 2023-04-18 DIAGNOSIS — I10 Essential (primary) hypertension: Secondary | ICD-10-CM

## 2023-04-18 DIAGNOSIS — I5022 Chronic systolic (congestive) heart failure: Secondary | ICD-10-CM

## 2023-04-18 DIAGNOSIS — E785 Hyperlipidemia, unspecified: Secondary | ICD-10-CM | POA: Diagnosis not present

## 2023-04-18 DIAGNOSIS — R079 Chest pain, unspecified: Secondary | ICD-10-CM

## 2023-04-18 NOTE — Patient Instructions (Signed)
Medication Instructions:  No changes *If you need a refill on your cardiac medications before your next appointment, please call your pharmacy*  Lab Work: No labs  Testing/Procedures: No testing  Follow-Up: At [facility], you and your health needs are our priority.  As part of our continuing mission to provide you with exceptional heart care, we have created designated Provider Care Teams.  These Care Teams include your primary Cardiologist (physician) and Advanced Practice Providers (APPs -  Physician Assistants and Nurse Practitioners) who all work together to provide you with the care you need, when you need it.  We recommend signing up for the patient portal called "MyChart".  Sign up information is provided on this After Visit Summary.  MyChart is used to connect with patients for Virtual Visits (Telemedicine).  Patients are able to view lab/test results, encounter notes, upcoming appointments, etc.  Non-urgent messages can be sent to your provider as well.   To learn more about what you can do with MyChart, go to [email or web address].    Your next appointment:   3 month(s)  Provider:   Dietrich Pates, MD

## 2023-07-17 ENCOUNTER — Other Ambulatory Visit: Payer: Self-pay | Admitting: Internal Medicine

## 2023-07-17 DIAGNOSIS — Z1231 Encounter for screening mammogram for malignant neoplasm of breast: Secondary | ICD-10-CM

## 2023-07-18 ENCOUNTER — Ambulatory Visit
Admission: RE | Admit: 2023-07-18 | Discharge: 2023-07-18 | Disposition: A | Discharge status: Home / Self Care | Source: Ambulatory Visit | Attending: Internal Medicine | Admitting: Internal Medicine

## 2023-07-18 DIAGNOSIS — Z1231 Encounter for screening mammogram for malignant neoplasm of breast: Secondary | ICD-10-CM

## 2023-07-21 NOTE — Progress Notes (Unsigned)
 Cardiology Office Note   Date:  07/22/2023   ID:  Katrina Carrillo, Katrina Carrillo 11/16/1965, MRN [id number]  PCP:  Charle Congo, MD  Cardiologist:   Ola Berger, MD   Pt presents for F/U of HFmrEF and HTN      History of Present Illness: Teana Lindahl is a 58 y.o. female with a history of NICM, HTN.   -2010  LVEF 15%  LHC showed normal coronary arteries   -Oct 2019 Echo showed LVEF 40 to 45% \ -2020  Pt seen in ER for angioedema   ACE inhibitor stopped Pt has also had rash develop with lipitor and crestor   I saw the pt in clinic in early 2024  She was seen by APP since then in Jan 2025  The pt says her breathing is stable    She denies CP    Biggest complaint is knee problems  Due to go back to ortho  soon    Admits today that she is taking meds one time per day   Outpatient Medications Prior to Visit  Medication Sig Dispense Refill   albuterol (VENTOLIN HFA) 108 (90 Base) MCG/ACT inhaler ProAir HFA 90 mcg/actuation aerosol inhaler  inhale 2 puffs by mouth every 4 hours if needed for cough or whee...  (REFER TO PRESCRIPTION NOTES).     carvedilol  (COREG ) 6.25 MG tablet TAKE 2 TABLETS(12.5 MG) BY MOUTH EVERY 12 HOURS 360 tablet 2   diclofenac  Sodium (VOLTAREN ) 1 % GEL Apply 4 g topically 4 (four) times daily. 100 g 4   EPINEPHrine 0.3 mg/0.3 mL IJ SOAJ injection EpiPen 2-Pak 0.3 mg/0.3 mL injection, auto-injector     furosemide  (LASIX ) 40 MG tablet Take 1 tablet (40 mg total) by mouth 2 (two) times daily. 180 tablet 3   hydrALAZINE  (APRESOLINE ) 25 MG tablet TAKE 1 TABLET(25 MG) BY MOUTH TWICE DAILY 180 tablet 1   ipratropium (ATROVENT ) 0.03 % nasal spray ipratropium bromide  21 mcg (0.03 %) nasal spray     isosorbide  mononitrate (IMDUR ) 30 MG 24 hr tablet Take 1 tablet (30 mg total) by mouth daily. 90 tablet 3   loratadine (CLARITIN) 10 MG tablet Take 10 mg by mouth daily.     NASONEX 50 MCG/ACT nasal spray Place 2 sprays into both nostrils as needed.   0   OVER THE  COUNTER MEDICATION Amino Acid capsules, two daily.     potassium chloride  SA (KLOR-CON ) 20 MEQ tablet TAKE 2 TABLETS BY MOUTH 2 TIMES A DAY 120 tablet 6   TURMERIC PO Take by mouth 2 (two) times daily.     UNABLE TO FIND Inhale 2 sprays into the lungs as needed. qvar inhaler     Vitamin D , Ergocalciferol , (DRISDOL ) 1.25 MG (50000 UNIT) CAPS capsule Take 1 capsule (50,000 Units total) by mouth every 7 (seven) days. 4 capsule 0   atorvastatin  (LIPITOR) 20 MG tablet Take 1 tablet (20 mg total) by mouth daily. (Patient not taking: Reported on 07/22/2023) 90 tablet 3   No facility-administered medications prior to visit.     Allergies:   Peanut-containing drug products, Shellfish allergy, Vasotec  [enalapril ], Atorvastatin , Iodine, and Rosuvastatin    Past Medical History:  Diagnosis Date   Allergy    Arthritis    Asthma    right knee    CHF (congestive heart failure) (HCC)    cardiomyopathy   Diverticulosis    Family history of malignant neoplasm of gastrointestinal tract 02/17/2012   Father with colon cancer age 73  GASTROESOPHAGEAL REFLUX DISEASE 02/22/2009   Qualifier: Diagnosis of  By: Rochelle Chu, CNA, Deborah     GERD (gastroesophageal reflux disease)    pt states doesnt have recently    Hyperlipidemia    Hypertension    Nonischemic cardiomyopathy (HCC)    LHC 01/2009: Normal coronary arteries, EF 15%. Last echo 09/2010: EF 35-40%, grade 2 diastolic dysfunction, trivial MR, mild LAE.8/14 EF 40-45%    Obesity (BMI 30-39.9) 12/07/2015   OSA (obstructive sleep apnea) 06/12/2015   Mild OSA with total AHI 8/hr and in REM sleep 26/hr with oxygen desaturations as low as 70% with respiratory events.     OSA (obstructive sleep apnea)    use CPAP nightly   Seasonal allergies    Sleep apnea    wears cpap    Past Surgical History:  Procedure Laterality Date   CESAREAN SECTION  07/11/1991   COLONOSCOPY     POLYSOMNOGRAPHY 4 OR MORE PARAMETERS  08/03/2015   TUBAL LIGATION     S/P      Social History:  The patient  reports that she quit smoking about 35 years ago. Her smoking use included cigarettes. She started smoking about 37 years ago. She has never used smokeless tobacco. She reports current alcohol use. She reports that she does not use drugs.   Family History:  The patient's family history includes Alcoholism in her father; Cancer in her father, mother, and paternal grandmother; Colon cancer (age of onset: 73) in her father; Coronary artery disease in her father; Diabetes in her maternal grandmother and mother; Hypertension in her father, mother, and another family member; Mental retardation in her son; Obesity in her mother.    ROS:  Please see the history of present illness. All other systems are reviewed and  Negative to the above problem except as noted.    PHYSICAL EXAM: VS:  BP (!) 140/88 (BP Location: Left Arm, Patient Position: Sitting, Cuff Size: Normal)   Pulse 78   Ht 5\' 3"  (1.6 m)   Wt 220 lb (99.8 kg)   SpO2 98%   BMI 38.97 kg/m   GEN: Obese 58 yo in no acute distress  HEENT: normal  Neck: JVP is not elevated   No carotid bruit Cardiac: RRR; no murmurs  No LE edema  Respiratory:  clear to auscultation  GI: soft, nontender, no masses  No hepatomegaly    EKG:  EKG not done    Lipid Panel    Component Value Date/Time   CHOL 144 09/26/2022 0000   CHOL 207 (H) 10/13/2019 1209   TRIG 54 09/26/2022 0000   HDL 57 09/26/2022 0000   HDL 60 10/13/2019 1209   CHOLHDL 2.5 09/26/2022 0000   VLDL 12 03/13/2015 0915   LDLCALC 74 09/26/2022 0000   LDLDIRECT 147.2 03/04/2013 1004      Wt Readings from Last 3 Encounters:  07/22/23 220 lb (99.8 kg)  04/18/23 218 lb (98.9 kg)  01/24/23 215 lb (97.5 kg)      ASSESSMENT AND PLAN:  1  HFrEF   NICM   Last echo in 2019 LVEF was 40 to 45%     Cannot take ACEi or ARB  due to allergic Rx She is not taking meds as directed    I would increase carvedilol , hydralazine  to BID Add spironolactone   12.5 COnitnue furosemide  and Imdur  daily Follow up labs in 10 day Will get echo later this year    2  HTN BP is a little high  136/82 on recheck   Follow on increased doses of meds   3  HL  Lpa high at 291,     Pt off of statins due to rash    WIll repeat lipomed     4   Metabolics  Check A1C     5 Obesity   Reviewed diet   Encouraged her to go to PT, get a trainer again    Follow up in the fall 2025   Signed, Ola Berger, MD  07/22/2023 10:11 AM    [facility] [address] [place], [place], [place]  [address] Phone: [phone]; Fax: [phone]

## 2023-07-22 ENCOUNTER — Ambulatory Visit: Payer: 59 | Admitting: Internal Medicine

## 2023-07-22 ENCOUNTER — Ambulatory Visit: Payer: 59 | Attending: Internal Medicine | Admitting: Internal Medicine

## 2023-07-22 VITALS — BP 132/84 | HR 78 | Ht 63.0 in | Wt 220.0 lb

## 2023-07-22 DIAGNOSIS — I5022 Chronic systolic (congestive) heart failure: Secondary | ICD-10-CM | POA: Diagnosis not present

## 2023-07-22 DIAGNOSIS — E785 Hyperlipidemia, unspecified: Secondary | ICD-10-CM | POA: Diagnosis not present

## 2023-07-22 DIAGNOSIS — R079 Chest pain, unspecified: Secondary | ICD-10-CM

## 2023-07-22 DIAGNOSIS — I428 Other cardiomyopathies: Secondary | ICD-10-CM

## 2023-07-22 DIAGNOSIS — I10 Essential (primary) hypertension: Secondary | ICD-10-CM | POA: Diagnosis not present

## 2023-07-22 DIAGNOSIS — Z79899 Other long term (current) drug therapy: Secondary | ICD-10-CM

## 2023-07-22 MED ORDER — SPIRONOLACTONE 25 MG PO TABS: 12.5000 mg | ORAL_TABLET | Freq: Every day | ORAL | 3 refills | Status: AC

## 2023-07-22 MED ORDER — FUROSEMIDE 40 MG PO TABS: 40.0000 mg | ORAL_TABLET | Freq: Every day | ORAL | 3 refills | Status: AC

## 2023-07-22 MED ORDER — POTASSIUM CHLORIDE CRYS ER 20 MEQ PO TBCR: 20.0000 meq | EXTENDED_RELEASE_TABLET | Freq: Every day | ORAL | 3 refills | Status: AC

## 2023-07-22 MED ORDER — CARVEDILOL 12.5 MG PO TABS: 12.5000 mg | ORAL_TABLET | Freq: Two times a day (BID) | ORAL | 3 refills | Status: AC

## 2023-07-22 NOTE — Patient Instructions (Signed)
 Medication Instructions:  ADD SPIRONOLACTONE  12.5 MG ONCE A DAY  *If you need a refill on your cardiac medications before your next appointment, please call your pharmacy*  Lab Work: IN 10 DAYS AT ANY [facility]  CME, VIT D, NMR, [facility]  If you have labs (blood work) drawn today and your tests are completely normal, you will receive your results only by: MyChart Message (if you have MyChart) OR A paper copy in the mail If you have any lab test that is abnormal or we need to change your treatment, we will call you to review the results.  Testing/Procedures:   Follow-Up: At [facility], you and your health needs are our priority.  As part of our continuing mission to provide you with exceptional heart care, our providers are all part of one team.  This team includes your primary Cardiologist (physician) and Advanced Practice Providers or APPs (Physician Assistants and Nurse Practitioners) who all work together to provide you with the care you need, when you need it.  Your next appointment:  FALL 2025  We recommend signing up for the patient portal called "MyChart".  Sign up information is provided on this After Visit Summary.  MyChart is used to connect with patients for Virtual Visits (Telemedicine).  Patients are able to view lab/test results, encounter notes, upcoming appointments, etc.  Non-urgent messages can be sent to your provider as well.   To learn more about what you can do with MyChart, go to [email or web address].   Other Instructions       1st Floor: - Lobby - Registration  - Pharmacy  - Lab - Cafe  2nd Floor: - PV Lab - Diagnostic Testing (echo, CT, nuclear med)  3rd Floor: - Vacant  4th Floor: - TCTS (cardiothoracic surgery) - AFib Clinic - Structural Heart Clinic - Vascular Surgery  - Vascular Ultrasound  5th Floor: - HeartCare Cardiology (general and EP) - Clinical Pharmacy for coumadin, hypertension, lipid, weight-loss medications, and med  management appointments    Valet parking services will be available as well.

## 2023-07-28 ENCOUNTER — Ambulatory Visit: Admitting: Physician Assistant

## 2023-07-28 ENCOUNTER — Other Ambulatory Visit (INDEPENDENT_AMBULATORY_CARE_PROVIDER_SITE_OTHER): Payer: Self-pay

## 2023-07-28 ENCOUNTER — Encounter: Payer: Self-pay | Admitting: Physician Assistant

## 2023-07-28 DIAGNOSIS — M25561 Pain in right knee: Secondary | ICD-10-CM

## 2023-07-28 DIAGNOSIS — G8929 Other chronic pain: Secondary | ICD-10-CM

## 2023-07-28 MED ORDER — METHYLPREDNISOLONE ACETATE 40 MG/ML IJ SUSP
40.0000 mg | INTRAMUSCULAR | Status: AC | PRN
  Administered 2023-07-28: 40 mg via INTRA_ARTICULAR

## 2023-07-28 MED ORDER — LIDOCAINE HCL 1 % IJ SOLN
3.0000 mL | INTRAMUSCULAR | Status: AC | PRN
  Administered 2023-07-28: 3 mL

## 2023-07-28 NOTE — Progress Notes (Signed)
 Office Visit Note   Patient: Katrina Carrillo           Date of Birth: [birth year]           MRN: [id number] Visit Date: 07/28/2023              Requested by: Charle Congo, MD [address] [place],  [place] [address] PCP: Charle Congo, MD   Assessment & Plan: Visit Diagnoses:  1. Chronic pain of right knee     Plan: Offered to her cortisone injection knee also discussed quad strengthening exercises.  Questions were encouraged and answered at length.  She will follow-up with us  in 2 weeks to see how she is doing overall.    Follow-Up Instructions: No follow-ups on file.   Orders:  Orders Placed This Encounter  Procedures   XR Knee 1-2 Views Right   Ambulatory referral to Physical Therapy   No orders of the defined types were placed in this encounter.     Procedures: Large Joint Inj: R knee on 07/28/2023 5:16 PM Indications: pain Details: 22 G 1.5 in needle, anterolateral approach  Arthrogram: No  Medications: 3 mL lidocaine 1 %; 40 mg methylPREDNISolone  acetate 40 MG/ML Outcome: tolerated well, no immediate complications Procedure, treatment alternatives, risks and benefits explained, specific risks discussed. Consent was given by the patient. Immediately prior to procedure a time out was called to verify the correct patient, procedure, equipment, support staff and site/side marked as required. Patient was prepped and draped in the usual sterile fashion.       Clinical Data: No additional findings.   Subjective: Chief Complaint  Patient presents with   Right Knee - Pain    HPI Katrina Carrillo returns today for right knee pain.  She states that her knee pain began a few weeks ago pain is mostly medial pain with going up or down stairs.  She has tried gel and turmeric which she feels is somewhat helpful.  She has also gone to therapy in the past but that has not really been doing any exercise recently.  No known injury.  She is nondiabetic.  Review of  Systems Negative for fevers or chills  Objective: Vital Signs: There were no vitals taken for this visit.  Physical Exam Constitutional:      Appearance: She is not ill-appearing or diaphoretic.  Pulmonary:     Effort: Pulmonary effort is normal.  Neurological:     Mental Status: She is alert and oriented to person, place, and time.  Psychiatric:        Mood and Affect: Mood normal.     Ortho Exam Bilateral knees: Good range of motion no instability valgus varus stressing.  Tenderness along medial joint line right knee.  No abnormal warmth erythema or effusion of either knee.  McMurray's is negative on the right.  Specialty Comments:  No specialty comments available.  Imaging: XR Knee 1-2 Views Right Result Date: 07/28/2023 Right knee 2 views: Moderate or severe narrowing medial joint line.  Moderate patellofemoral changes.  Lateral joint line overall well-maintained.  Marginal spurs off the lateral compartment.  Knee is overall well-maintained.  No acute fractures    PMFS History: Patient Active Problem List   Diagnosis Date Noted   Menopause present 01/20/2018   SI (sacroiliac) joint dysfunction 05/06/2017   Lumbar radiculopathy 05/06/2017   Obesity (BMI 30-39.9) 12/07/2015   OSA (obstructive sleep apnea) 06/12/2015   Persistent asthma 01/23/2015   Allergic rhinoconjunctivitis, seasonal and perennial 01/29/2014  Nonischemic cardiomyopathy (HCC) 08/12/2012   Goiter 06/11/2010   CHRONIC SYSTOLIC HEART FAILURE 08/09/2009   Essential hypertension 02/22/2009   LIVER HEMANGIOMA 02/15/2009   Past Medical History:  Diagnosis Date   Allergy    Arthritis    Asthma    right knee    CHF (congestive heart failure) (HCC)    cardiomyopathy   Diverticulosis    Family history of malignant neoplasm of gastrointestinal tract 02/17/2012   Father with colon cancer age 16    GASTROESOPHAGEAL REFLUX DISEASE 02/22/2009   Qualifier: Diagnosis of  By: Rochelle Chu, CNA, Deborah     GERD  (gastroesophageal reflux disease)    pt states doesnt have recently    Hyperlipidemia    Hypertension    Nonischemic cardiomyopathy (HCC)    LHC 01/2009: Normal coronary arteries, EF 15%. Last echo 09/2010: EF 35-40%, grade 2 diastolic dysfunction, trivial MR, mild LAE.8/14 EF 40-45%    Obesity (BMI 30-39.9) 12/07/2015   OSA (obstructive sleep apnea) 06/12/2015   Mild OSA with total AHI 8/hr and in REM sleep 26/hr with oxygen desaturations as low as 70% with respiratory events.     OSA (obstructive sleep apnea)    use CPAP nightly   Seasonal allergies    Sleep apnea    wears cpap    Family History  Problem Relation Age of Onset   Diabetes Mother    Cancer Mother        vulvular cancer   Hypertension Mother    Obesity Mother    Coronary artery disease Father    Colon cancer Father 57   Cancer Father    Hypertension Father    Alcoholism Father    Diabetes Maternal Grandmother    Cancer Paternal Grandmother    Mental retardation Son        TBI during birth   Hypertension Other    Stomach cancer Neg Hx    Colon polyps Neg Hx    Rectal cancer Neg Hx    Breast cancer Neg Hx    Esophageal cancer Neg Hx     Past Surgical History:  Procedure Laterality Date   CESAREAN SECTION  07/11/1991   COLONOSCOPY     POLYSOMNOGRAPHY 4 OR MORE PARAMETERS  08/03/2015   TUBAL LIGATION     S/P   Social History   Occupational History   Occupation: Conservation officer, historic buildings: UNEMPLOYED    Comment: Journalist, newspaper   Occupation: Geophysical data processor: [employer]  Tobacco Use   Smoking status: Former    Current packs/day: 0.00    Types: Cigarettes    Start date: 04/01/1986    Quit date: 04/01/1988    Years since quitting: 35.3   Smokeless tobacco: Never  Vaping Use   Vaping status: Never Used  Substance and Sexual Activity   Alcohol use: Yes    Comment: social   Drug use: No   Sexual activity: Not on file

## 2023-08-11 ENCOUNTER — Other Ambulatory Visit: Payer: Self-pay | Admitting: *Deleted

## 2023-08-11 MED ORDER — ISOSORBIDE MONONITRATE ER 30 MG PO TB24: 30.0000 mg | ORAL_TABLET | Freq: Every day | ORAL | 3 refills | Status: AC

## 2023-08-12 ENCOUNTER — Ambulatory Visit: Payer: Self-pay | Admitting: *Deleted

## 2023-08-12 LAB — COMPREHENSIVE METABOLIC PANEL WITH GFR
ALT: 18 IU/L (ref 0–32)
AST: 15 IU/L (ref 0–40)
Albumin: 3.8 g/dL (ref 3.8–4.9)
Alkaline Phosphatase: 68 IU/L (ref 44–121)
BUN/Creatinine Ratio: 20 (ref 9–23)
BUN: 12 mg/dL (ref 6–24)
Bilirubin Total: 0.2 mg/dL (ref 0.0–1.2)
CO2: 23 mmol/L (ref 20–29)
Calcium: 9 mg/dL (ref 8.7–10.2)
Chloride: 101 mmol/L (ref 96–106)
Creatinine, Ser: 0.59 mg/dL (ref 0.57–1.00)
Globulin, Total: 3.1 g/dL (ref 1.5–4.5)
Glucose: 101 mg/dL — ABNORMAL HIGH (ref 70–99)
Potassium: 4.1 mmol/L (ref 3.5–5.2)
Sodium: 137 mmol/L (ref 134–144)
Total Protein: 6.9 g/dL (ref 6.0–8.5)
eGFR: 104 mL/min/{1.73_m2} (ref >59)

## 2023-08-12 LAB — NMR, LIPOPROFILE
Cholesterol, Total: 192 mg/dL (ref 100–199)
HDL Particle Number: 33.4 umol/L (ref >=30.5)
HDL-C: 59 mg/dL (ref >39)
LDL Particle Number: 1309 nmol/L — ABNORMAL HIGH (ref <1000)
LDL Size: 21.1 nm (ref >20.5)
LDL-C (NIH Calc): 116 mg/dL — ABNORMAL HIGH (ref 0–99)
LP-IR Score: 39 (ref <=45)
Small LDL Particle Number: 588 nmol/L — ABNORMAL HIGH (ref <=527)
Triglycerides: 93 mg/dL (ref 0–149)

## 2023-08-12 LAB — VITAMIN D 25 HYDROXY (VIT D DEFICIENCY, FRACTURES): Vit D, 25-Hydroxy: 34.9 ng/mL (ref 30.0–100.0)

## 2023-08-12 LAB — HEMOGLOBIN A1C
Est. average glucose Bld gHb Est-mCnc: 146 mg/dL
Hgb A1c MFr Bld: 6.7 % — ABNORMAL HIGH (ref 4.8–5.6)

## 2023-08-14 ENCOUNTER — Other Ambulatory Visit: Payer: Self-pay

## 2023-08-14 DIAGNOSIS — I428 Other cardiomyopathies: Secondary | ICD-10-CM

## 2023-08-14 DIAGNOSIS — R079 Chest pain, unspecified: Secondary | ICD-10-CM

## 2023-08-14 DIAGNOSIS — I10 Essential (primary) hypertension: Secondary | ICD-10-CM

## 2023-08-14 DIAGNOSIS — E785 Hyperlipidemia, unspecified: Secondary | ICD-10-CM

## 2023-08-14 DIAGNOSIS — Z79899 Other long term (current) drug therapy: Secondary | ICD-10-CM

## 2023-08-14 DIAGNOSIS — I5022 Chronic systolic (congestive) heart failure: Secondary | ICD-10-CM

## 2023-08-14 MED ORDER — NEXLIZET 180-10 MG PO TABS: 1.0000 | ORAL_TABLET | Freq: Every day | ORAL | 3 refills | Status: AC

## 2023-08-14 MED ORDER — SM VITAMIN D3 100 MCG (4000 UT) PO CAPS: 4000.0000 [IU] | ORAL_CAPSULE | Freq: Every day | ORAL | 3 refills | Status: AC

## 2023-08-15 ENCOUNTER — Other Ambulatory Visit (HOSPITAL_COMMUNITY): Payer: Self-pay

## 2023-08-18 ENCOUNTER — Other Ambulatory Visit (HOSPITAL_COMMUNITY): Payer: Self-pay

## 2023-08-18 ENCOUNTER — Telehealth: Payer: Self-pay

## 2023-08-18 NOTE — Telephone Encounter (Signed)
 Pharmacy Patient Advocate Encounter   Received notification from CoverMyMeds that prior authorization for NEXLIZET  is required/requested.   Insurance verification completed.   The patient is insured through [facility] .   Per test claim: PA required; PA submitted to above mentioned insurance via CoverMyMeds Key/confirmation #/EOC [id number] Status is pending

## 2023-08-18 NOTE — Telephone Encounter (Signed)
 Pharmacy Patient Advocate Encounter  Received notification from [facility] that Prior Authorization for Nexlizet  has been APPROVED from 08/18/23 to 08/18/26. Ran test claim, Copay is $75.00- 3 months. This test claim was processed through [facility]- copay amounts may vary at other pharmacies due to pharmacy/plan contracts, or as the patient moves through the different stages of their insurance plan.   PA #/Case ID/Reference #: [id number]

## 2023-09-09 ENCOUNTER — Other Ambulatory Visit: Payer: Self-pay | Admitting: Internal Medicine

## 2024-01-05 ENCOUNTER — Ambulatory Visit: Admitting: Physician Assistant

## 2024-01-05 ENCOUNTER — Encounter: Payer: Self-pay | Admitting: Physician Assistant

## 2024-01-05 DIAGNOSIS — M79661 Pain in right lower leg: Secondary | ICD-10-CM

## 2024-01-05 DIAGNOSIS — M1711 Unilateral primary osteoarthritis, right knee: Secondary | ICD-10-CM

## 2024-01-05 MED ORDER — DICLOFENAC SODIUM 1 % EX GEL: 4.0000 g | Freq: Four times a day (QID) | CUTANEOUS | 4 refills | Status: AC

## 2024-01-05 NOTE — Progress Notes (Signed)
 HPI: Katrina Carrillo comes in today for right knee pain.  She underwent a cortisone injection in the right knee on 07/28/2023.  States the injection was helpful.  But it never resolved all the pain in the knee.  She is not having calf pain and swelling of the knee.  She does feel like the knee may give way at times.  No other mechanical symptoms.  Pain is worse with bending.  No shortness of breath fevers chills.  She reports that she has been doing home exercise program for her quads.  Review of systems: See HPI otherwise negative  Physical exam: General Well-developed well-nourished female in no acute distress ambulates without any assistive device.  Bilateral knees: No abnormal warmth erythema of either knee.  Positive effusion right knee only.  Tenderness right calf only.  Good range of motion of the right knee with crepitus.  No gross instability valgus varus stressing right knee.  Impression: Right knee tricompartmental arthritis Right calf pain  Plan: Will send her for Doppler to rule out DVT right lower extremity.  Will work on this good supplementation injection approval given the fact that she has failed conservative treatment which is included exercise medications and cortisone injection.  She will continue her turmeric and Voltaren  gel.  Prescription for Voltaren  gel was sent in.

## 2024-01-06 ENCOUNTER — Ambulatory Visit (HOSPITAL_COMMUNITY)
Admission: RE | Admit: 2024-01-06 | Discharge: 2024-01-06 | Disposition: A | Discharge status: Home / Self Care | Source: Ambulatory Visit | Attending: Physician Assistant | Admitting: Physician Assistant

## 2024-01-06 ENCOUNTER — Telehealth: Payer: Self-pay

## 2024-01-06 DIAGNOSIS — M1711 Unilateral primary osteoarthritis, right knee: Secondary | ICD-10-CM | POA: Insufficient documentation

## 2024-01-06 NOTE — Telephone Encounter (Signed)
 Vascular center called and stated the pt is negative.

## 2024-01-06 NOTE — Telephone Encounter (Signed)
 Patient aware

## 2024-01-28 ENCOUNTER — Telehealth: Payer: Self-pay | Admitting: Physician Assistant

## 2024-01-28 NOTE — Telephone Encounter (Signed)
Talked with patient concerning gel injection and appt.has been scheduled.

## 2024-01-28 NOTE — Telephone Encounter (Signed)
 Patient called and wanted to know if she got approved for the Gel shot and why she didn't get an appointment. [phone]

## 2024-02-02 ENCOUNTER — Encounter: Payer: Self-pay | Admitting: Radiology

## 2024-02-09 ENCOUNTER — Ambulatory Visit: Admitting: Physician Assistant

## 2024-02-09 DIAGNOSIS — M1711 Unilateral primary osteoarthritis, right knee: Secondary | ICD-10-CM

## 2024-02-09 MED ORDER — HYALURONAN 88 MG/4ML IX SOSY
88.0000 mg | PREFILLED_SYRINGE | INTRA_ARTICULAR | Status: AC | PRN
  Administered 2024-02-09: 88 mg via INTRA_ARTICULAR

## 2024-02-09 MED ORDER — LIDOCAINE HCL 1 % IJ SOLN
3.0000 mL | INTRAMUSCULAR | Status: AC | PRN
  Administered 2024-02-09: 3 mL

## 2024-02-09 NOTE — Progress Notes (Signed)
   Procedure Note  Patient: Katrina Carrillo             Date of Birth: [birth year]           MRN: [id number]             Visit Date: 02/09/2024  HPI: Katrina Carrillo comes in today for scheduled right knee Monovisc injection.  She has someone who is tried cortisone injections, over-the-counter medications.  Knee strengthening exercise but continues to have right knee pain along the medial aspect.  She has known tricompartmental arthritis of her right knee.  She has no scheduled surgery on the right knee in the next 6 months.  Physical exam: Right knee no abnormal warmth erythema.  Positive effusion.  Tenderness along medial joint line.  Overall good range of motion of the right knee.  Procedures: Visit Diagnoses:  1. Primary osteoarthritis of right knee     Large Joint Inj: R knee on 02/09/2024 3:05 PM Indications: pain Details: 22 G 1.5 in needle, superolateral approach  Arthrogram: No  Medications: 88 mg Hyaluronan 88 MG/4ML; 3 mL lidocaine  1 % Aspirate: 13 mL yellow Outcome: tolerated well, no immediate complications Procedure, treatment alternatives, risks and benefits explained, specific risks discussed. Consent was given by the patient. Immediately prior to procedure a time out was called to verify the correct patient, procedure, equipment, support staff and site/side marked as required. Patient was prepped and draped in the usual sterile fashion.    Plan: Will see her back in just 8 weeks see how she is doing overall.  Knee was wrapped with Ace bandage today which she will remove before returning to bed.  Questions were encouraged and answered at length.  She denies late 6 months between viscosupplementation injections.
